# Patient Record
Sex: Female | Born: 1945 | Race: White | Hispanic: No | Smoking: Never smoker
Health system: Southern US, Community
[De-identification: ages and names within clinical notes are randomized; demographics above are authoritative.]

## PROBLEM LIST (undated history)

## (undated) DIAGNOSIS — D1803 Hemangioma of intra-abdominal structures: Secondary | ICD-10-CM

## (undated) DIAGNOSIS — A048 Other specified bacterial intestinal infections: Secondary | ICD-10-CM

## (undated) DIAGNOSIS — I700 Atherosclerosis of aorta: Secondary | ICD-10-CM

## (undated) DIAGNOSIS — K760 Fatty (change of) liver, not elsewhere classified: Secondary | ICD-10-CM

## (undated) DIAGNOSIS — U071 COVID-19: Secondary | ICD-10-CM

## (undated) DIAGNOSIS — D1800 Hemangioma unspecified site: Secondary | ICD-10-CM

## (undated) DIAGNOSIS — K449 Diaphragmatic hernia without obstruction or gangrene: Secondary | ICD-10-CM

## (undated) DIAGNOSIS — D126 Benign neoplasm of colon, unspecified: Secondary | ICD-10-CM

## (undated) DIAGNOSIS — F419 Anxiety disorder, unspecified: Secondary | ICD-10-CM

## (undated) DIAGNOSIS — I728 Aneurysm of other specified arteries: Secondary | ICD-10-CM

## (undated) DIAGNOSIS — K219 Gastro-esophageal reflux disease without esophagitis: Secondary | ICD-10-CM

## (undated) DIAGNOSIS — C4491 Basal cell carcinoma of skin, unspecified: Secondary | ICD-10-CM

## (undated) DIAGNOSIS — E785 Hyperlipidemia, unspecified: Secondary | ICD-10-CM

## (undated) DIAGNOSIS — I10 Essential (primary) hypertension: Secondary | ICD-10-CM

## (undated) DIAGNOSIS — K5790 Diverticulosis of intestine, part unspecified, without perforation or abscess without bleeding: Secondary | ICD-10-CM

## (undated) HISTORY — DX: Gastro-esophageal reflux disease without esophagitis: K21.9

## (undated) HISTORY — DX: COVID-19: U07.1

## (undated) HISTORY — DX: Aneurysm of other specified arteries: I72.8

## (undated) HISTORY — DX: Benign neoplasm of colon, unspecified: D12.6

## (undated) HISTORY — DX: Fatty (change of) liver, not elsewhere classified: K76.0

## (undated) HISTORY — DX: Diaphragmatic hernia without obstruction or gangrene: K44.9

## (undated) HISTORY — PX: OTHER SURGICAL HISTORY: SHX169

## (undated) HISTORY — DX: Diverticulosis of intestine, part unspecified, without perforation or abscess without bleeding: K57.90

## (undated) HISTORY — DX: Anxiety disorder, unspecified: F41.9

## (undated) HISTORY — DX: Atherosclerosis of aorta: I70.0

## (undated) HISTORY — PX: CHOLECYSTECTOMY: SHX55

## (undated) HISTORY — DX: Other specified bacterial intestinal infections: A04.8

## (undated) HISTORY — DX: Hyperlipidemia, unspecified: E78.5

## (undated) HISTORY — PX: UPPER GASTROINTESTINAL ENDOSCOPY: SHX188

## (undated) HISTORY — DX: Essential (primary) hypertension: I10

## (undated) HISTORY — DX: Hemangioma of intra-abdominal structures: D18.03

## (undated) HISTORY — DX: Hemangioma unspecified site: D18.00

---
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
## 1999-07-25 ENCOUNTER — Other Ambulatory Visit: Admission: RE | Admit: 1999-07-25 | Discharge: 1999-07-25 | Payer: Self-pay | Admitting: Obstetrics and Gynecology

## 2000-08-15 ENCOUNTER — Other Ambulatory Visit: Admission: RE | Admit: 2000-08-15 | Discharge: 2000-08-15 | Payer: Self-pay | Admitting: Obstetrics and Gynecology

## 2001-08-18 ENCOUNTER — Other Ambulatory Visit: Admission: RE | Admit: 2001-08-18 | Discharge: 2001-08-18 | Payer: Self-pay | Admitting: Obstetrics and Gynecology

## 2002-03-30 ENCOUNTER — Encounter: Payer: Self-pay | Admitting: Orthopaedic Surgery

## 2002-03-30 ENCOUNTER — Ambulatory Visit (HOSPITAL_COMMUNITY): Admission: RE | Admit: 2002-03-30 | Discharge: 2002-03-30 | Payer: Self-pay | Admitting: Orthopaedic Surgery

## 2002-08-31 ENCOUNTER — Other Ambulatory Visit: Admission: RE | Admit: 2002-08-31 | Discharge: 2002-08-31 | Payer: Self-pay | Admitting: Obstetrics and Gynecology

## 2003-03-02 ENCOUNTER — Other Ambulatory Visit: Admission: RE | Admit: 2003-03-02 | Discharge: 2003-03-02 | Payer: Self-pay | Admitting: Obstetrics and Gynecology

## 2003-10-18 ENCOUNTER — Other Ambulatory Visit: Admission: RE | Admit: 2003-10-18 | Discharge: 2003-10-18 | Payer: Self-pay | Admitting: Obstetrics and Gynecology

## 2004-10-09 ENCOUNTER — Ambulatory Visit: Payer: Self-pay | Admitting: Family Medicine

## 2004-10-11 ENCOUNTER — Ambulatory Visit: Payer: Self-pay | Admitting: Family Medicine

## 2004-11-05 ENCOUNTER — Other Ambulatory Visit: Admission: RE | Admit: 2004-11-05 | Discharge: 2004-11-05 | Payer: Self-pay | Admitting: Obstetrics and Gynecology

## 2005-01-23 ENCOUNTER — Ambulatory Visit: Payer: Self-pay | Admitting: Family Medicine

## 2005-01-29 ENCOUNTER — Ambulatory Visit: Payer: Self-pay | Admitting: Family Medicine

## 2005-02-13 ENCOUNTER — Encounter (INDEPENDENT_AMBULATORY_CARE_PROVIDER_SITE_OTHER): Payer: Self-pay | Admitting: *Deleted

## 2005-02-13 ENCOUNTER — Encounter (INDEPENDENT_AMBULATORY_CARE_PROVIDER_SITE_OTHER): Payer: Self-pay | Admitting: Specialist

## 2005-02-13 ENCOUNTER — Ambulatory Visit (HOSPITAL_COMMUNITY): Admission: RE | Admit: 2005-02-13 | Discharge: 2005-02-13 | Payer: Self-pay | Admitting: Gastroenterology

## 2007-02-16 ENCOUNTER — Ambulatory Visit: Payer: Self-pay | Admitting: Family Medicine

## 2007-03-19 ENCOUNTER — Ambulatory Visit: Payer: Self-pay | Admitting: Internal Medicine

## 2007-03-19 DIAGNOSIS — E782 Mixed hyperlipidemia: Secondary | ICD-10-CM

## 2007-03-25 LAB — CONVERTED CEMR LAB
ALT: 19 units/L (ref 0–40)
AST: 18 units/L (ref 0–37)
Cholesterol: 133 mg/dL (ref 0–200)
HDL: 40.3 mg/dL (ref >39.0)
LDL Cholesterol: 74 mg/dL (ref 0–99)
Total CHOL/HDL Ratio: 3.3
Triglycerides: 96 mg/dL (ref 0–149)
VLDL: 19 mg/dL (ref 0–40)

## 2007-06-11 ENCOUNTER — Ambulatory Visit: Payer: Self-pay | Admitting: Family Medicine

## 2007-06-11 DIAGNOSIS — I749 Embolism and thrombosis of unspecified artery: Secondary | ICD-10-CM | POA: Insufficient documentation

## 2007-06-12 ENCOUNTER — Encounter (INDEPENDENT_AMBULATORY_CARE_PROVIDER_SITE_OTHER): Payer: Self-pay | Admitting: Internal Medicine

## 2007-06-12 ENCOUNTER — Telehealth (INDEPENDENT_AMBULATORY_CARE_PROVIDER_SITE_OTHER): Payer: Self-pay | Admitting: Internal Medicine

## 2007-06-29 HISTORY — PX: KNEE SURGERY: SHX244

## 2007-06-30 ENCOUNTER — Telehealth (INDEPENDENT_AMBULATORY_CARE_PROVIDER_SITE_OTHER): Payer: Self-pay | Admitting: Internal Medicine

## 2007-09-23 ENCOUNTER — Encounter (INDEPENDENT_AMBULATORY_CARE_PROVIDER_SITE_OTHER): Payer: Self-pay | Admitting: *Deleted

## 2007-10-02 ENCOUNTER — Ambulatory Visit: Payer: Self-pay | Admitting: Family Medicine

## 2007-10-07 LAB — CONVERTED CEMR LAB
ALT: 32 units/L (ref 0–35)
AST: 22 units/L (ref 0–37)
BUN: 13 mg/dL (ref 6–23)
CO2: 29 meq/L (ref 19–32)
Calcium: 9.4 mg/dL (ref 8.4–10.5)
Chloride: 108 meq/L (ref 96–112)
Cholesterol: 198 mg/dL (ref 0–200)
Creatinine, Ser: 0.7 mg/dL (ref 0.4–1.2)
GFR calc Af Amer: 109 mL/min
GFR calc non Af Amer: 90 mL/min
Glucose, Bld: 94 mg/dL (ref 70–99)
HDL: 48.2 mg/dL (ref >39.0)
LDL Cholesterol: 136 mg/dL — ABNORMAL HIGH (ref 0–99)
Potassium: 4.5 meq/L (ref 3.5–5.1)
Sodium: 142 meq/L (ref 135–145)
Total CHOL/HDL Ratio: 4.1
Triglycerides: 67 mg/dL (ref 0–149)
VLDL: 13 mg/dL (ref 0–40)

## 2007-12-29 ENCOUNTER — Encounter (INDEPENDENT_AMBULATORY_CARE_PROVIDER_SITE_OTHER): Payer: Self-pay | Admitting: Internal Medicine

## 2008-04-14 ENCOUNTER — Ambulatory Visit: Payer: Self-pay | Admitting: Family Medicine

## 2008-04-18 LAB — CONVERTED CEMR LAB
ALT: 21 units/L (ref 0–35)
Cholesterol: 152 mg/dL (ref 0–200)
Triglycerides: 78 mg/dL (ref 0–149)

## 2008-06-01 ENCOUNTER — Ambulatory Visit: Payer: Self-pay | Admitting: Family Medicine

## 2008-06-01 DIAGNOSIS — K219 Gastro-esophageal reflux disease without esophagitis: Secondary | ICD-10-CM

## 2008-06-01 DIAGNOSIS — R1011 Right upper quadrant pain: Secondary | ICD-10-CM

## 2008-07-13 ENCOUNTER — Encounter (INDEPENDENT_AMBULATORY_CARE_PROVIDER_SITE_OTHER): Payer: Self-pay | Admitting: Internal Medicine

## 2008-10-05 ENCOUNTER — Ambulatory Visit: Payer: Self-pay | Admitting: Family Medicine

## 2008-10-06 LAB — CONVERTED CEMR LAB
ALT: 16 units/L (ref 0–35)
Cholesterol: 141 mg/dL (ref 0–200)
HDL: 45.1 mg/dL (ref >39.0)
LDL Cholesterol: 81 mg/dL (ref 0–99)
Triglycerides: 73 mg/dL (ref 0–149)
VLDL: 15 mg/dL (ref 0–40)

## 2008-10-28 DIAGNOSIS — D1800 Hemangioma unspecified site: Secondary | ICD-10-CM

## 2008-10-28 DIAGNOSIS — K760 Fatty (change of) liver, not elsewhere classified: Secondary | ICD-10-CM

## 2008-10-28 HISTORY — DX: Fatty (change of) liver, not elsewhere classified: K76.0

## 2008-10-28 HISTORY — DX: Hemangioma unspecified site: D18.00

## 2008-11-30 ENCOUNTER — Ambulatory Visit: Payer: Self-pay | Admitting: Family Medicine

## 2008-11-30 DIAGNOSIS — K449 Diaphragmatic hernia without obstruction or gangrene: Secondary | ICD-10-CM | POA: Insufficient documentation

## 2008-11-30 DIAGNOSIS — M949 Disorder of cartilage, unspecified: Secondary | ICD-10-CM

## 2008-11-30 DIAGNOSIS — M899 Disorder of bone, unspecified: Secondary | ICD-10-CM | POA: Insufficient documentation

## 2008-11-30 HISTORY — DX: Disorder of bone, unspecified: M89.9

## 2009-01-10 ENCOUNTER — Ambulatory Visit: Payer: Self-pay | Admitting: Family Medicine

## 2009-01-12 LAB — CONVERTED CEMR LAB
ALT: 20 units/L (ref 0–35)
HDL: 46.5 mg/dL (ref >39.00)
LDL Cholesterol: 100 mg/dL — ABNORMAL HIGH (ref 0–99)
VLDL: 18.6 mg/dL (ref 0.0–40.0)

## 2009-02-16 ENCOUNTER — Ambulatory Visit (HOSPITAL_BASED_OUTPATIENT_CLINIC_OR_DEPARTMENT_OTHER): Admission: RE | Admit: 2009-02-16 | Discharge: 2009-02-16 | Payer: Self-pay | Admitting: Orthopedic Surgery

## 2009-04-10 ENCOUNTER — Telehealth (INDEPENDENT_AMBULATORY_CARE_PROVIDER_SITE_OTHER): Payer: Self-pay | Admitting: Internal Medicine

## 2009-06-05 ENCOUNTER — Encounter (INDEPENDENT_AMBULATORY_CARE_PROVIDER_SITE_OTHER): Payer: Self-pay | Admitting: Internal Medicine

## 2009-06-05 ENCOUNTER — Ambulatory Visit: Payer: Self-pay | Admitting: Family Medicine

## 2009-06-06 ENCOUNTER — Ambulatory Visit: Payer: Self-pay | Admitting: Family Medicine

## 2009-06-06 LAB — CONVERTED CEMR LAB
ALT: 15 units/L (ref 0–35)
Bacteria, UA: 0
Basophils Absolute: 0 10*3/uL (ref 0.0–0.1)
Basophils Relative: 0 % (ref 0.0–3.0)
Bilirubin Urine: NEGATIVE
Bilirubin, Direct: 0.1 mg/dL (ref 0.0–0.3)
CO2: 29 meq/L (ref 19–32)
Calcium: 8.7 mg/dL (ref 8.4–10.5)
Eosinophils Absolute: 0.1 10*3/uL (ref 0.0–0.7)
GFR calc non Af Amer: 76.58 mL/min (ref >60)
Glucose, Urine, Semiquant: NEGATIVE
HCT: 36.4 % (ref 36.0–46.0)
HDL: 44.6 mg/dL (ref >39.00)
Hemoglobin: 12.5 g/dL (ref 12.0–15.0)
Ketones, urine, test strip: NEGATIVE
Lymphocytes Relative: 28 % (ref 12.0–46.0)
MCHC: 34.4 g/dL (ref 30.0–36.0)
Monocytes Absolute: 0.4 10*3/uL (ref 0.1–1.0)
Monocytes Relative: 9.9 % (ref 3.0–12.0)
Neutro Abs: 2.6 10*3/uL (ref 1.4–7.7)
Potassium: 4.3 meq/L (ref 3.5–5.1)
RBC / HPF: 0
RBC: 3.85 M/uL — ABNORMAL LOW (ref 3.87–5.11)
Sodium: 144 meq/L (ref 135–145)
Specific Gravity, Urine: 1.01
TSH: 2.02 microintl units/mL (ref 0.35–5.50)
Total Bilirubin: 1 mg/dL (ref 0.3–1.2)
Total CHOL/HDL Ratio: 4
pH: 7.5

## 2009-06-07 ENCOUNTER — Telehealth (INDEPENDENT_AMBULATORY_CARE_PROVIDER_SITE_OTHER): Payer: Self-pay | Admitting: *Deleted

## 2009-06-09 ENCOUNTER — Ambulatory Visit: Payer: Self-pay | Admitting: Gastroenterology

## 2009-06-12 ENCOUNTER — Ambulatory Visit (HOSPITAL_COMMUNITY): Admission: RE | Admit: 2009-06-12 | Discharge: 2009-06-12 | Payer: Self-pay | Admitting: Gastroenterology

## 2009-06-16 ENCOUNTER — Ambulatory Visit (HOSPITAL_COMMUNITY): Admission: RE | Admit: 2009-06-16 | Discharge: 2009-06-16 | Payer: Self-pay | Admitting: Gastroenterology

## 2009-06-16 ENCOUNTER — Telehealth: Payer: Self-pay | Admitting: Gastroenterology

## 2009-06-19 ENCOUNTER — Telehealth: Payer: Self-pay | Admitting: Gastroenterology

## 2009-07-14 ENCOUNTER — Telehealth: Payer: Self-pay | Admitting: Gastroenterology

## 2009-07-24 ENCOUNTER — Telehealth: Payer: Self-pay | Admitting: Family Medicine

## 2009-07-24 ENCOUNTER — Encounter (INDEPENDENT_AMBULATORY_CARE_PROVIDER_SITE_OTHER): Payer: Self-pay | Admitting: Internal Medicine

## 2009-08-04 ENCOUNTER — Ambulatory Visit: Payer: Self-pay | Admitting: Family Medicine

## 2009-08-04 DIAGNOSIS — J019 Acute sinusitis, unspecified: Secondary | ICD-10-CM

## 2009-08-11 ENCOUNTER — Ambulatory Visit (HOSPITAL_COMMUNITY): Admission: RE | Admit: 2009-08-11 | Discharge: 2009-08-11 | Payer: Self-pay | Admitting: General Surgery

## 2009-08-11 ENCOUNTER — Encounter (INDEPENDENT_AMBULATORY_CARE_PROVIDER_SITE_OTHER): Payer: Self-pay | Admitting: General Surgery

## 2009-08-14 ENCOUNTER — Encounter (INDEPENDENT_AMBULATORY_CARE_PROVIDER_SITE_OTHER): Payer: Self-pay | Admitting: Internal Medicine

## 2009-08-28 ENCOUNTER — Encounter (INDEPENDENT_AMBULATORY_CARE_PROVIDER_SITE_OTHER): Payer: Self-pay | Admitting: Internal Medicine

## 2009-08-30 ENCOUNTER — Telehealth: Payer: Self-pay | Admitting: Family Medicine

## 2009-08-30 ENCOUNTER — Encounter (INDEPENDENT_AMBULATORY_CARE_PROVIDER_SITE_OTHER): Payer: Self-pay | Admitting: Internal Medicine

## 2009-10-30 ENCOUNTER — Ambulatory Visit: Payer: Self-pay | Admitting: Family Medicine

## 2009-11-05 ENCOUNTER — Encounter (INDEPENDENT_AMBULATORY_CARE_PROVIDER_SITE_OTHER): Payer: Self-pay | Admitting: Internal Medicine

## 2009-11-29 ENCOUNTER — Telehealth: Payer: Self-pay | Admitting: Gastroenterology

## 2009-11-30 DIAGNOSIS — R933 Abnormal findings on diagnostic imaging of other parts of digestive tract: Secondary | ICD-10-CM

## 2009-12-04 ENCOUNTER — Ambulatory Visit: Payer: Self-pay | Admitting: Family Medicine

## 2009-12-04 LAB — CONVERTED CEMR LAB
ALT: 16 units/L (ref 0–35)
LDL Cholesterol: 103 mg/dL — ABNORMAL HIGH (ref 0–99)
Total CHOL/HDL Ratio: 3
Triglycerides: 110 mg/dL (ref 0.0–149.0)

## 2009-12-05 ENCOUNTER — Ambulatory Visit (HOSPITAL_COMMUNITY): Admission: RE | Admit: 2009-12-05 | Discharge: 2009-12-05 | Payer: Self-pay | Admitting: Gastroenterology

## 2009-12-05 ENCOUNTER — Telehealth: Payer: Self-pay | Admitting: Gastroenterology

## 2009-12-05 LAB — CONVERTED CEMR LAB: Vit D, 25-Hydroxy: 24 ng/mL — ABNORMAL LOW (ref 30–89)

## 2010-03-13 ENCOUNTER — Encounter: Payer: Self-pay | Admitting: Family Medicine

## 2010-06-05 ENCOUNTER — Encounter (INDEPENDENT_AMBULATORY_CARE_PROVIDER_SITE_OTHER): Payer: Self-pay | Admitting: *Deleted

## 2010-07-31 ENCOUNTER — Encounter (INDEPENDENT_AMBULATORY_CARE_PROVIDER_SITE_OTHER): Payer: Self-pay | Admitting: *Deleted

## 2010-09-05 ENCOUNTER — Telehealth: Payer: Self-pay | Admitting: Family Medicine

## 2010-10-28 HISTORY — PX: COLONOSCOPY: SHX174

## 2010-11-27 NOTE — Assessment & Plan Note (Signed)
Summary: SINUS/DLO   Vital Signs:  Patient profile:   65 year old female Height:      61.5 inches Weight:      176.13 pounds BMI:     32.86 Temp:     98.5 degrees F oral Pulse rate:   80 / minute Pulse rhythm:   regular BP sitting:   112 / 82  (left arm) Cuff size:   regular  Vitals Entered By: Delilah Shan CMA Duncan Dull) (October 30, 2009 3:48 PM) CC: Sinus problems   History of Present Illness: 65 yo with 3 day h/o sinus pressure, frontal headache and malaise. Trying mucinex OTC. Concerned because she is having cataract surgery in a few days. No cough, no wheezing, no SOB. No sore throat. No n/v/d.  Current Medications (verified): 1)  Fish Oil 1000 Mg  Caps (Omega-3 Fatty Acids) .... Take 1 Capsule By Mouth Four Times A Day 2)  Balanced Care   Tabs (Multiple Vitamins-Minerals) .... Take 1 Tablet By Mouth Once A Day 3)  Calcium 600/vitamin D 600-200 Mg-Unit  Tabs (Calcium Carbonate-Vitamin D) .... Take 1 Tablet By Mouth Two Times A Day 4)  Cvs Loratadine 10 Mg  Tabs (Loratadine) .... Take 1 Tablet By Mouth Once A Day As Needed 5)  Cvs High Potency Vitamin D 1000 Unit  Tabs (Cholecalciferol) .... Take 1 Tablet By Mouth Once A Day 6)  Simvastatin 40 Mg Tabs (Simvastatin) .Marland Kitchen.. 1 Daily For Cholesterol 7)  Omeprazole 40 Mg Cpdr (Omeprazole) .Marland Kitchen.. 1 Each Morning With Water, No Food or Fluids For 30-60 Min, Then Miust Have Food or Fluids 8)  Vitamin C 500 Mg  Tabs (Ascorbic Acid) .... Otc As Directed. 9)  Hyomax-Sl 0.125 Mg Subl (Hyoscyamine Sulfate) .... Take 2 Tabs Sublingual Q.4 H. P.r.n. 10)  Azithromycin 250 Mg  Tabs (Azithromycin) .... 2 By  Mouth Today and Then 1 Daily For 4 Days  Allergies: 1)  ! Sulfa  Review of Systems      See HPI  Physical Exam  General:  alert, well-developed, well-nourished, well-hydrated, and overweight-appearing.  NAD Ears:  R ear normal and L ear normal.   Nose:  mucosal erythema.  +/- sinuses Mouth:  Oral mucosa and oropharynx without lesions  or exudates.  Teeth in good repair. Lungs:  normal respiratory effort, no intercostal retractions, no accessory muscle use, and normal breath sounds.   Heart:  normal rate, regular rhythm, and no murmur.   Psych:  normally interactive and moderately anxious.     Impression & Recommendations:  Problem # 1:  SINUSITIS, ACUTE (ICD-461.9) Assessment New Will treat with Zpack given that she is having surgery in a few days.  Continue supportive care with mucinex and Ibuprofen.  Advised to call her opthomologist to update him with her current status. The following medications were removed from the medication list:    Azithromycin 500 Mg Tabs (Azithromycin) .Marland Kitchen... 1 tab by mouth daily. Her updated medication list for this problem includes:    Azithromycin 250 Mg Tabs (Azithromycin) .Marland Kitchen... 2 by  mouth today and then 1 daily for 4 days  Complete Medication List: 1)  Fish Oil 1000 Mg Caps (Omega-3 fatty acids) .... Take 1 capsule by mouth four times a day 2)  Balanced Care Tabs (Multiple vitamins-minerals) .... Take 1 tablet by mouth once a day 3)  Calcium 600/vitamin D 600-200 Mg-unit Tabs (Calcium carbonate-vitamin d) .... Take 1 tablet by mouth two times a day 4)  Cvs Loratadine 10 Mg  Tabs (Loratadine) .... Take 1 tablet by mouth once a day as needed 5)  Cvs High Potency Vitamin D 1000 Unit Tabs (Cholecalciferol) .... Take 1 tablet by mouth once a day 6)  Simvastatin 40 Mg Tabs (Simvastatin) .Marland Kitchen.. 1 daily for cholesterol 7)  Omeprazole 40 Mg Cpdr (Omeprazole) .Marland Kitchen.. 1 each morning with water, no food or fluids for 30-60 min, then miust have food or fluids 8)  Vitamin C 500 Mg Tabs (Ascorbic acid) .... Otc as directed. 9)  Hyomax-sl 0.125 Mg Subl (Hyoscyamine sulfate) .... Take 2 tabs sublingual q.4 h. p.r.n. 10)  Azithromycin 250 Mg Tabs (Azithromycin) .... 2 by  mouth today and then 1 daily for 4 days Prescriptions: AZITHROMYCIN 250 MG  TABS (AZITHROMYCIN) 2 by  mouth today and then 1 daily for 4  days  #6 x 0   Entered and Authorized by:   Ruthe Mannan MD   Signed by:   Ruthe Mannan MD on 10/30/2009   Method used:   Electronically to        [facility] [address] [phone]* (retail)       [address] [place], [place]  [id number]       Ph: [phone]       Fax: [phone]   RxID:   [id number]   Current Allergies (reviewed today): ! SULFA

## 2010-11-27 NOTE — Progress Notes (Signed)
Summary: prior auth needed for omeprazole  Phone Note From Pharmacy   Caller: [facility] [address] #[id number]*/ [facility] Summary of Call: Prior Berkley Harvey is needed for omeprazole, form is on your desk. Initial call taken by: Lowella Petties CMA, AAMA,  September 05, 2010 2:53 PM  Follow-up for Phone Call        on my desk. Ruthe Mannan MD  September 06, 2010 8:41 AM  Prior Berkley Harvey given for omeprazole, approval letter is on  your desk for signature and scanning.  Advised pharmacy. Follow-up by: Lowella Petties CMA, AAMA,  September 10, 2010 12:34 PM

## 2010-11-27 NOTE — Miscellaneous (Signed)
Summary: flu vaccine at [facility]  Clinical Lists Changes  Observations: Added new observation of FLU VAX: Historical (07/27/2010 8:17)      Immunization History:  Influenza Immunization History:    Influenza:  historical (07/27/2010)  Pt received flu vaccine at [facility] [address], .             Lowella Petties CMA  July 31, 2010 8:17 AM

## 2010-11-27 NOTE — Progress Notes (Signed)
Summary: F/U Ultrasound Scheduled  ---- Converted from flag ---- ---- 06/12/2009 3:23 PM, Laureen Ochs LPN wrote: Needs f/u liver ultrasound, see report from 06-12-09. ------------------------------  Phone Note Outgoing Call   Call placed by: Laureen Ochs LPN,  November 29, 2009 4:13 PM Call placed to: Patient Summary of Call:  Ultrasound scheduled at [facility] on 12-05-09 at 8am, NPO after 12mn. Message left for patient to callback.  Initial call taken by: Laureen Ochs LPN,  November 29, 2009 4:14 PM  Follow-up for Phone Call        Ultrasound appt. information reviewed w/pt. Pt. instructed to call back as needed.  Follow-up by: Laureen Ochs LPN,  November 30, 2009 9:39 AM  New Problems: NONSPECIFIC ABN FINDING RAD & OTH EXAM GI TRACT (ICD-793.4)   New Problems: NONSPECIFIC ABN FINDING RAD & OTH EXAM GI TRACT (ICD-793.4)

## 2010-11-27 NOTE — Letter (Signed)
Summary: Nadara Eaton letter  [facility]  [address] [place], [place] [address]   Phone: [phone]  Fax: [phone]       06/05/2010 MRN: [id number]  [facility] [address] [place], [place]  [address]  Dear Ms. [facility],  [facility] [place], and [facility] announce the retirement of Arta Silence, M.D., from full-time practice at the [facility] office effective April 26, 2010 and his plans of returning part-time.  It is important to Dr. Hetty Ely and to our practice that you understand that [facility] has seven physicians in our office for your health care needs.  We will continue to offer the same exceptional care that you have today.    Dr. Hetty Ely has spoken to many of you about his plans for retirement and returning part-time in the fall.   We will continue to work with you through the transition to schedule appointments for you in the office and meet the high standards that [facility] is committed to.   Again, it is with great pleasure that we share the news that Dr. Hetty Ely will return to [facility] at [facility] in October of 2011 with a reduced schedule.    If you have any questions, or would like to request an appointment with one of our physicians, please call us at [phone] and press the option for Scheduling an appointment.  We take pleasure in providing you with excellent patient care and look forward to seeing you at your next office visit.  Our [facility] Physicians are:  Tillman Abide, M.D. Laurita Quint, M.D. Roxy Manns, M.D. Kerby Nora, M.D. Hannah Beat, M.D. Ruthe Mannan, M.D. We proudly welcomed Raechel Ache, M.D. and Eustaquio Boyden, M.D. to the practice in July/August 2011.  Sincerely,   [facility]

## 2010-11-27 NOTE — Progress Notes (Signed)
Summary: Ultrasound Report  ---- Converted from flag ---- ---- 12/05/2009 2:18 PM, Louis Meckel MD wrote: please tell pt ultrasound of hemangioma looks stable - no significant changes ------------------------------  Phone Note Outgoing Call   Call placed by: Laureen Ochs LPN,  December 05, 2009 3:34 PM Call placed to: Patient Summary of Call: Above MD orders reviewed with patient. Pt. instructed to call back as needed.   Initial call taken by: Laureen Ochs LPN,  December 05, 2009 3:35 PM

## 2010-11-27 NOTE — Miscellaneous (Signed)
Summary: DEXA--12/29/2007 from GYN  Clinical Lists Changes  Observations: Added new observation of PAST SURG HX: EGD +--3/06--[facility] 9/08 R knee surg--meniscus tear--Murphy Left knee surgery  laproscopic cholecystectomy--08/11/2009 DEXA--GYN, osteopenia--12/29/2007  (11/05/2009 13:40) Added new observation of BONE DENSITY: abnormal (12/29/2007 13:41)      Preventive Care Screening  Bone Density:    Date:  12/29/2007    Results:  abnormal   Past History:  Past Surgical History: EGD +--3/06--[facility]y [facility] 9/08 R knee surg--meniscus tear--Murphy Left knee surgery  laproscopic cholecystectomy--08/11/2009 DEXA--GYN, osteopenia--12/29/2007

## 2010-12-20 ENCOUNTER — Encounter: Payer: Self-pay | Admitting: Family Medicine

## 2010-12-20 ENCOUNTER — Other Ambulatory Visit: Payer: Self-pay | Admitting: Family Medicine

## 2010-12-20 ENCOUNTER — Ambulatory Visit (INDEPENDENT_AMBULATORY_CARE_PROVIDER_SITE_OTHER): Payer: Medicare Other | Admitting: Family Medicine

## 2010-12-20 DIAGNOSIS — E782 Mixed hyperlipidemia: Secondary | ICD-10-CM

## 2010-12-20 DIAGNOSIS — M899 Disorder of bone, unspecified: Secondary | ICD-10-CM

## 2010-12-20 DIAGNOSIS — K219 Gastro-esophageal reflux disease without esophagitis: Secondary | ICD-10-CM

## 2010-12-20 LAB — HEPATIC FUNCTION PANEL
ALT: 16 U/L (ref 0–35)
Bilirubin, Direct: 0.2 mg/dL (ref 0.0–0.3)
Total Bilirubin: 1 mg/dL (ref 0.3–1.2)

## 2010-12-20 LAB — LIPID PANEL
Cholesterol: 154 mg/dL (ref 0–200)
HDL: 40.9 mg/dL (ref >39.00)
LDL Cholesterol: 94 mg/dL (ref 0–99)
Total CHOL/HDL Ratio: 4
Triglycerides: 95 mg/dL (ref 0.0–149.0)
VLDL: 19 mg/dL (ref 0.0–40.0)

## 2010-12-20 LAB — BASIC METABOLIC PANEL
BUN: 15 mg/dL (ref 6–23)
Calcium: 9.2 mg/dL (ref 8.4–10.5)
Creatinine, Ser: 0.7 mg/dL (ref 0.4–1.2)
GFR: 88.91 mL/min (ref >60.00)

## 2010-12-25 ENCOUNTER — Encounter: Payer: Self-pay | Admitting: Family Medicine

## 2010-12-25 LAB — HM MAMMOGRAPHY

## 2010-12-25 LAB — HM PAP SMEAR

## 2010-12-25 LAB — HM COLONOSCOPY

## 2010-12-25 NOTE — Assessment & Plan Note (Signed)
Summary: TRANSFER FROM [facility]/REFILL MED/CLE  MEDICARE/BCBS   Vital Signs:  Patient profile:   65 year old female Height:      61.5 inches Weight:      181 pounds BMI:     33.77 Temp:     98.2 degrees F oral Pulse rate:   69 / minute Pulse rhythm:   regular BP sitting:   130 / 80  (left arm) Cuff size:   regular  Vitals Entered By: Linde Gillis CMA Duncan Dull) (December 20, 2010 8:35 AM) CC: establish care, Billie patient   History of Present Illness: 65 yo here to transfer care to me (Billie Pt) without complaints.  HLD- on Simvastatin 40 mg daily.  No complaints, no myalgias.  GERD- well controlled with as needed omeprazole.  Has OBGYN, UTD on prevention (awaiting records).  Osteopenia- per pt, recent dexa scan was stable- osteopenia.   Current Medications (verified): 1)  Fish Oil 1000 Mg  Caps (Omega-3 Fatty Acids) .... Take 1 Capsule By Mouth Four Times A Day 2)  Cvs Loratadine 10 Mg  Tabs (Loratadine) .... Take 1 Tablet By Mouth Once A Day As Needed 3)  Cvs High Potency Vitamin D 1000 Unit  Tabs (Cholecalciferol) .... Take 1 Tablet By Mouth Once A Day 4)  Simvastatin 40 Mg Tabs (Simvastatin) .Marland Kitchen.. 1 Daily For Cholesterol 5)  Omeprazole 40 Mg Cpdr (Omeprazole) .Marland Kitchen.. 1 Each Morning With Water, No Food or Fluids For 30-60 Min, Then Miust Have Food or Fluids 6)  Vitamin C 500 Mg  Tabs (Ascorbic Acid) .... Otc As Directed.  Allergies: 1)  ! 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  Alcohol Use - no  Risk Factors: Caffeine Use: 2 (11/30/2008) Exercise: no (11/30/2008)  Risk Factors: Smoking Status: never (06/09/2009) Passive Smoke Exposure: no (11/30/2008)  Review of Systems      See HPI General:  Denies malaise. Eyes:  Denies blurring. ENT:  Denies difficulty swallowing. CV:  Denies chest pain or discomfort. Resp:  Denies shortness of breath. GI:  Denies abdominal pain and change in bowel habits. GU:  Denies abnormal vaginal bleeding, discharge, and dysuria. MS:  Denies joint pain, joint redness, and joint swelling. Derm:  Denies rash. Neuro:  Denies headaches. Psych:  Denies anxiety and depression. Endo:  Denies cold intolerance and heat intolerance. Heme:  Denies abnormal bruising and bleeding.  Physical Exam  General:  alert, well-developed, well-nourished, well-hydrated, and overweight-appearing.  NAD Head:  normocephalic and atraumatic.   Eyes:  vision grossly intact, pupils equal, pupils round, and pupils reactive to light.   Ears:  R ear normal and L ear normal.   Nose:  no external deformity.   Mouth:  good dentition.   Neck:  No deformities, masses, or tenderness noted. Lungs:  Normal respiratory effort, chest expands symmetrically. Lungs are clear to auscultation, no crackles or wheezes. Heart:  Normal rate and regular rhythm. S1 and S2 normal without gallop, murmur, click, rub or other extra sounds. Abdomen:  soft, non-tender, and normal bowel sounds.  Msk:  No deformity or scoliosis noted of thoracic or lumbar spine.   Extremities:  no edema Neurologic:  alert & oriented X3 and cranial nerves II-XII intact.   Skin:  Intact without suspicious lesions or rashes Psych:  normally interactive and moderately anxious.     Impression & Recommendations:  Problem # 1:  OSTEOPENIA (ICD-733.90) Assessment  Unchanged per pt, stable.  Continue calcium/vit D.  Will request records. Recheck Vit D today. The following medications were removed from the medication list:    Calcium 600/vitamin D 600-200 Mg-unit Tabs (Calcium carbonate-vitamin d) .Marland Kitchen... Take 1 tablet by mouth two times a day Her updated medication list for this problem includes:    Cvs High Potency Vitamin D 1000 Unit Tabs (Cholecalciferol) .Marland Kitchen... Take 1 tablet by mouth once a day  Orders: T-Vitamin D (25-Hydroxy) (04540-98119)  Problem # 2:  HYPERLIPIDEMIA, MIXED (ICD-272.2) Assessment: Unchanged recheck FLP, Lipid panel today. Her updated medication list for this problem includes:    Simvastatin 40 Mg Tabs (Simvastatin) .Marland Kitchen... 1 daily for cholesterol  Orders: Venipuncture (14782) TLB-Lipid Panel (80061-LIPID) TLB-BMP (Basic Metabolic Panel-BMET) (80048-METABOL) TLB-Hepatic/Liver Function Pnl (80076-HEPATIC)  Problem # 3:  GERD (ICD-530.81) Assessment: Unchanged Stable.   The following medications were removed from the medication list:    Hyomax-sl 0.125 Mg Subl (Hyoscyamine sulfate) .Marland Kitchen... Take 2 tabs sublingual q.4 h. p.r.n. Her updated medication list for this problem includes:    Omeprazole 40 Mg Cpdr (Omeprazole) .Marland Kitchen... 1 each morning with water, no food or fluids for 30-60 min, then miust have food or fluids  Complete Medication List: 1)  Fish Oil 1000 Mg Caps (Omega-3 fatty acids) .... Take 1 capsule by mouth four times a day 2)  Cvs Loratadine 10 Mg Tabs (Loratadine) .... Take 1 tablet by mouth once a day as needed 3)  Cvs High Potency Vitamin D 1000 Unit Tabs (Cholecalciferol) .... Take 1 tablet by mouth once a day 4)  Simvastatin 40 Mg Tabs (Simvastatin) .Marland Kitchen.. 1 daily for cholesterol 5)  Omeprazole 40 Mg Cpdr (Omeprazole) .Marland Kitchen.. 1 each morning with water, no food or fluids for 30-60 min, then miust have food or fluids 6)  Vitamin C 500 Mg Tabs (Ascorbic acid) .... Otc as directed.  Patient Instructions: 1)  Please  make an appoitnment for a medicare physical.   Orders Added: 1)  Venipuncture [36415] 2)  TLB-Lipid Panel [80061-LIPID] 3)  TLB-BMP (Basic Metabolic Panel-BMET) [80048-METABOL] 4)  TLB-Hepatic/Liver Function Pnl [80076-HEPATIC] 5)  T-Vitamin D (25-Hydroxy) [95621-30865] 6)  Est. Patient Level IV [78469]    Current Allergies (reviewed today): ! SULFA  PAP Result Date:  03/13/2010 PAP Result:  historical Mammogram Result Date:  03/08/2010 Mammogram Result:  historical Last Bone Density:  abnormal (12/29/2007 1:41:04 PM) Bone Density Result Date:  03/08/2010 Bone Density Result:  historical- osteopenia

## 2011-01-15 ENCOUNTER — Ambulatory Visit (INDEPENDENT_AMBULATORY_CARE_PROVIDER_SITE_OTHER): Payer: Medicare Other | Admitting: Family Medicine

## 2011-01-15 ENCOUNTER — Encounter: Payer: Self-pay | Admitting: Family Medicine

## 2011-01-15 VITALS — BP 130/82 | HR 71 | Temp 98.3°F | Ht 62.0 in | Wt 181.4 lb

## 2011-01-15 DIAGNOSIS — E785 Hyperlipidemia, unspecified: Secondary | ICD-10-CM

## 2011-01-15 DIAGNOSIS — Z0000 Encounter for general adult medical examination without abnormal findings: Secondary | ICD-10-CM

## 2011-01-15 NOTE — Progress Notes (Deleted)
  Subjective:    Patient ID: Leah Melendez, female    DOB: [birth year], 65 y.o.   MRN: [id number]  HPI    Review of Systems     Objective:   Physical Exam        Assessment & Plan:

## 2011-01-15 NOTE — Progress Notes (Signed)
I have personally reviewed the Medicare Annual Wellness questionnaire and have noted 1. The patient's medical and social history  HLD- on Simvastatin 40 mg daily.  No complaints, no myalgias.  GERD- well controlled with as needed omeprazole.  Has OBGYN, UTD on prevention (awaiting records).  Osteopenia- per pt, recent dexa scan was stable- osteopenia 2. Their use of alcohol, tobacco or illicit drugs 3. Their current medications and supplements 4. The patient's functional ability including ADL's, fall risks, home safety risks and hearing or visual             Impairment.- see medicare form 5. Diet and physical activities- tries to remain physically active, babysits her grand daughter. 6. Evidence for depression or mood disorders- none   General:  alert, well-developed, well-nourished, well-hydrated, and overweight-appearing.  NAD Head:  normocephalic and atraumatic.   Eyes:  vision grossly intact, pupils equal, pupils round, and pupils reactive to light.   Ears:  R ear normal and L ear normal.   Nose:  no external deformity.   Mouth:  good dentition.   Neck:  No deformities, masses, or tenderness noted. Lungs:  Normal respiratory effort, chest expands symmetrically. Lungs are clear to auscultation, no crackles or wheezes. Heart:  Normal rate and regular rhythm. S1 and S2 normal without gallop, murmur, click, rub or other extra sounds. Abdomen:  soft, non-tender, and normal bowel sounds.   Msk:  No deformity or scoliosis noted of thoracic or lumbar spine.   Extremities:  no edema Neurologic:  alert & oriented X3 and cranial nerves II-XII intact.   Skin:  Intact without suspicious lesions or rashes Psych:  normally interactive and moderately anxious.    The patients weight, height, BMI and visual acuity have been recorded in the chart I have made referrals, counseling and provided education to the patient based review of the above and I have provided the pt with a written personalized  care plan for preventive services.

## 2011-01-31 LAB — COMPREHENSIVE METABOLIC PANEL
ALT: 15 U/L (ref 0–35)
Albumin: 3.8 g/dL (ref 3.5–5.2)
Alkaline Phosphatase: 81 U/L (ref 39–117)
BUN: 14 mg/dL (ref 6–23)
Chloride: 107 mEq/L (ref 96–112)
Glucose, Bld: 86 mg/dL (ref 70–99)
Potassium: 3.9 mEq/L (ref 3.5–5.1)
Sodium: 140 mEq/L (ref 135–145)
Total Bilirubin: 1.1 mg/dL (ref 0.3–1.2)

## 2011-01-31 LAB — DIFFERENTIAL
Basophils Absolute: 0 10*3/uL (ref 0.0–0.1)
Basophils Relative: 1 % (ref 0–1)
Eosinophils Absolute: 0.1 10*3/uL (ref 0.0–0.7)
Monocytes Absolute: 0.5 10*3/uL (ref 0.1–1.0)
Neutro Abs: 3.2 10*3/uL (ref 1.7–7.7)

## 2011-01-31 LAB — CBC
HCT: 39.4 % (ref 36.0–46.0)
Hemoglobin: 13.8 g/dL (ref 12.0–15.0)
Platelets: 184 10*3/uL (ref 150–400)
WBC: 5.8 10*3/uL (ref 4.0–10.5)

## 2011-02-02 ENCOUNTER — Other Ambulatory Visit: Payer: Self-pay | Admitting: Family Medicine

## 2011-03-12 NOTE — Op Note (Signed)
NAMEALEXANDR, OEHLER         ACCOUNT NO.:  [id number]   MEDICAL RECORD NO.:  [id number]          PATIENT TYPE:  AMB   LOCATION:  DSC                          FACILITY:  [facility]   PHYSICIAN:  Loreta Ave, M.D. 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  Mild fraying of lateral meniscus, middle and anterior third  without appreciable tears.   PROCEDURES:  Left knee exam under anesthesia, arthroscopy, partial  medial meniscectomy, chondroplasty of patella and medial compartment.  Removal of chondral loose bodies.   SURGEON:  Loreta Ave, MD   ASSISTANT:  Genene Churn. Barry Dienes, [place]   ANESTHESIA:  Knee block with sedation.   SPECIMENS:  None.   CULTURES:  None.   COMPLICATIONS:  None.   DRESSINGS:  Soft compressive.   PROCEDURE IN DETAIL:  The patient was brought to the operating room and  placed on operating table in the supine position.  After adequate  anesthesia had been obtained, knee examined.  A little lateral tracking  not marked, not tethering.  Full motion stable ligaments.  Tourniquet  and leg holder applied.  Leg prepped and draped in usual sterile  fashion.  Three portals created, 1 superolateral, 1 each medial and  lateral parapatellar.  Inflow catheter introduced, arthroscope  introduced, knee distended and inspected.  Chondral debris throughout  cleared.  Grade 2 and 3 change of patella debrided.  Trochlea looked  good.  A little lateral tracking, not lateral tethering.  Cruciate  ligaments intact.  Lateral compartment looked excellent and little  fraying of the meniscus in the front that was easily debrided.  Posterior horn looked good.  On  the medial side, diffuse grade 2 and 3  changes, weightbearing dome debrided.  Radial detachment of the  posterior third with marked intermeniscal tearing throughout the entire  posterior third.  Posterior third meniscus removed, tapered into  remaining meniscus yielding a solid stable meniscal tissue in  the anterior two-thirds at completion.  Entire knee examined, no other  findings appreciated.  Instruments and fluid removed.  Portals of knee  injected with Marcaine.  Portals closed with 4-0 nylon.  Sterile  compressive dressing applied.  Anesthesia reversed.  Brought to recovery  room.  Tolerated the surgery well.  No complications.      Loreta Ave, M.D.  Electronically Signed     DFM/MEDQ  D:  02/16/2009  T:  02/17/2009  Job:  [id number]

## 2011-03-15 NOTE — Op Note (Signed)
NAMETSERING, LEAMAN         ACCOUNT NO.:  [id number]   MEDICAL RECORD NO.:  [id number]           PATIENT TYPE:   LOCATION:                                 FACILITY:   PHYSICIAN:  Danise Edge, M.D.        DATE OF BIRTH:   DATE OF PROCEDURE:  02/13/2005  DATE OF DISCHARGE:                                 OPERATIVE REPORT   PROCEDURE:  Esophagogastroduodenoscopy and screening colonoscopy.   Ms. Borrelli has chronic gastroesophageal reflux and her heartburn is  controlled when she takes her Prilosec each morning. She is due for a  screening colonoscopy with polypectomy to prevent colon cancer.   ENDOSCOPIST:  Danise Edge, M.D.   PREMEDICATION:  Versed 6 mg, Demerol 60 mg.   PROCEDURE:  Esophagogastroduodenoscopy with gastric polyp biopsy and  intravenous Versed to achieve conscious sedation for the procedure:  The  patient's blood pressure, oxygen saturation and cardiac rhythm were  monitored throughout the procedure and documented in the medical record.   The Olympus gastroscope was passed through the posterior hypopharynx into  the proximal esophagus without difficulty. The hypopharynx and larynx  appeared normal. I did not visualize the vocal cords.   ESOPHAGOSCOPY:  The proximal, mid and lower segments of the esophageal  mucosa appeared normal. The squamocolumnar junction and esophagogastric  junction are noted at 36 cm from the incisor teeth. There is no endoscopic  evidence for the presence of Barrett's esophagus and was normal. From the  mid gastric body, a 1 mm polyp was removed with cold biopsy forceps. The  gastric antrum and pylorus appeared normal.   DUODENOSCOPY:  The duodenal bulb and descending duodenum appeared normal.   ASSESSMENT:  A diminutive polyp was removed from the mid gastric body;  otherwise normal esophagogastroduodenoscopy.   PROCEDURE:  Screening proctocolonoscopy to the cecum:  Anal inspection and  digital rectal exam were normal. The  Olympus adjustable pediatric  colonoscope was introduced into the rectum. Colonic preparation for the exam  today was excellent.   RECTUM:  Normal.  SIGMOID COLON AND DESCENDING COLON:  Normal.  SPLENIC FLEXURE:  Normal.  TRANSVERSE COLON:  Normal.  HEPATIC FLEXURE:  Normal.  ASCENDING COLON:  Normal.  CECUM AND ILEOCECAL VALVE:  Normal.  DISTAL ILEUM:  Normal.   ASSESSMENT:  Normal proctocolonoscopy to the cecum with inspection of the  distal ileum.       ___________________________________________  Danise Edge, M.D.    MJ/MEDQ  D:  02/13/2005  T:  02/13/2005  Job:  [id number]   cc:   Laurita Quint, M.D.  [address] [place]  [place] [address]  Fax: [phone]   [facility], [place]

## 2011-06-04 ENCOUNTER — Telehealth: Payer: Self-pay | Admitting: Gastroenterology

## 2011-06-04 ENCOUNTER — Emergency Department (HOSPITAL_COMMUNITY)
Admission: EM | Admit: 2011-06-04 | Discharge: 2011-06-04 | Disposition: A | Discharge status: Home / Self Care | Payer: Medicare Other | Attending: Emergency Medicine | Admitting: Emergency Medicine

## 2011-06-04 DIAGNOSIS — R112 Nausea with vomiting, unspecified: Secondary | ICD-10-CM | POA: Insufficient documentation

## 2011-06-04 DIAGNOSIS — K219 Gastro-esophageal reflux disease without esophagitis: Secondary | ICD-10-CM | POA: Insufficient documentation

## 2011-06-04 DIAGNOSIS — R1011 Right upper quadrant pain: Secondary | ICD-10-CM | POA: Insufficient documentation

## 2011-06-04 DIAGNOSIS — R197 Diarrhea, unspecified: Secondary | ICD-10-CM | POA: Insufficient documentation

## 2011-06-04 NOTE — Telephone Encounter (Signed)
Her sister called.  She has right sided abd pain, chills, vomitting and diarrhea of relatively acute onset.  S/p lap chole 2 years ago (normal [place], normal HIDA, "mild chronic inflammation of GB" on path.  She sounds pretty ill, i recommeded that she go to ER for evaluation.

## 2011-06-05 ENCOUNTER — Telehealth: Payer: Self-pay | Admitting: Gastroenterology

## 2011-06-05 NOTE — Telephone Encounter (Signed)
Pt states she was seen the the er last night for ruq pain, nausea and vomiting. Pt was given some pain med and something for nausea. Pt requesting an appt. Pt scheduled to see Willette Cluster NP 06/06/11@9am . Pt aware of appt date and time.

## 2011-06-06 ENCOUNTER — Other Ambulatory Visit (INDEPENDENT_AMBULATORY_CARE_PROVIDER_SITE_OTHER): Payer: Medicare Other

## 2011-06-06 ENCOUNTER — Encounter: Payer: Self-pay | Admitting: Nurse Practitioner

## 2011-06-06 ENCOUNTER — Ambulatory Visit (INDEPENDENT_AMBULATORY_CARE_PROVIDER_SITE_OTHER): Payer: Medicare Other | Admitting: Nurse Practitioner

## 2011-06-06 VITALS — BP 112/90 | HR 108 | Temp 97.5°F | Ht 61.0 in | Wt 175.0 lb

## 2011-06-06 DIAGNOSIS — R1010 Upper abdominal pain, unspecified: Secondary | ICD-10-CM

## 2011-06-06 DIAGNOSIS — R197 Diarrhea, unspecified: Secondary | ICD-10-CM

## 2011-06-06 DIAGNOSIS — R109 Unspecified abdominal pain: Secondary | ICD-10-CM

## 2011-06-06 DIAGNOSIS — R112 Nausea with vomiting, unspecified: Secondary | ICD-10-CM

## 2011-06-06 DIAGNOSIS — K219 Gastro-esophageal reflux disease without esophagitis: Secondary | ICD-10-CM

## 2011-06-06 DIAGNOSIS — R1011 Right upper quadrant pain: Secondary | ICD-10-CM | POA: Insufficient documentation

## 2011-06-06 LAB — COMPREHENSIVE METABOLIC PANEL
ALT: 24 U/L (ref 0–35)
AST: 22 U/L (ref 0–37)
Alkaline Phosphatase: 74 U/L (ref 39–117)
BUN: 12 mg/dL (ref 6–23)
Creatinine, Ser: 0.9 mg/dL (ref 0.4–1.2)
Potassium: 3.8 mEq/L (ref 3.5–5.1)

## 2011-06-06 LAB — URINALYSIS
Nitrite: NEGATIVE
Total Protein, Urine: NEGATIVE
Urine Glucose: NEGATIVE
pH: 6 (ref 5.0–8.0)

## 2011-06-06 LAB — CBC WITH DIFFERENTIAL/PLATELET
Basophils Absolute: 0 10*3/uL (ref 0.0–0.1)
Basophils Relative: 0.3 % (ref 0.0–3.0)
Eosinophils Absolute: 0 10*3/uL (ref 0.0–0.7)
Lymphocytes Relative: 30.7 % (ref 12.0–46.0)
MCHC: 33.9 g/dL (ref 30.0–36.0)
MCV: 92.3 fl (ref 78.0–100.0)
Monocytes Absolute: 0.4 10*3/uL (ref 0.1–1.0)
Neutro Abs: 3.3 10*3/uL (ref 1.4–7.7)
Neutrophils Relative %: 61.5 % (ref 43.0–77.0)
RBC: 4.66 Mil/uL (ref 3.87–5.11)
RDW: 12.6 % (ref 11.5–14.6)

## 2011-06-06 NOTE — Patient Instructions (Addendum)
Continue to drink plenty of fluids. Let us know how you are doing tomorrow morning. Call Kemon Devincenzi @ [phone], Willette Cluster ACNP 's certified medical assistant.  Called pt next day on 07-08-11 and advised Gunnar Fusi ordered an EGD with Dr. Arlyce Dice at [facility]. Date is 06-10-11. Called pt and explained the procedure and prep.  Advised her we sent more Zofran to the pharmacy and she is to take 1 tab every 6 hours around the clock.

## 2011-06-06 NOTE — Assessment & Plan Note (Addendum)
Five day history of nausea, vomiting, and epigastric pain radiating around and through to the back. Patient had loose stool at the onset of symptoms but that resolved 2 days ago. She has chills but no fever. 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 Interestingly, a review of the records shows that patient had the same symptoms (including loose stool) back in 2010 just before cholecystectomy. Will obtain basic laboratory studies (CBC, CMET and lipase). Check UA. Continue antiemetics. Continue to push fluids. If no improvement in symptoms may need imaging studies and / or EGD for further evaluation.

## 2011-06-06 NOTE — Progress Notes (Signed)
Leah Melendez [id number] 23-Nov-1945   HISTORY OR PRESENT ILLNESS : [name] is a 65 year old female known to Dr. Arlyce Dice for a history of GERD. Prior to coming under the care of Dr. Arlyce Dice, patient had an EGD and normal screening colonoscopy by Dr. Laural Benes with [facility] in 2006. She was seen here in August 2010 for loose stool, nausea right upper quadrant pain. Ultrasound unremarkable, no gallstones or evidence for cholecystitis. HIDA scan normal, ejection fraction of 95%. She was seen by Dr. Dwain Sarna in September 2010 and had cholecystectomy in October 2010. 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  Patient is here today for evaluation of acute nausea, vomiting, diarrhea and RUQ pain which started five days ago. She gives long history of "bowel problems" wih urgent, loose stools 4-5 times a day, often following meals. Stools are more runny and frequent than usual, despite decreased intake. Her RUQ pain radiates around side and into right mid back. No dysuria. No hematuria.Laying down helps. Pain does not feel like previous gallbladder pain. RUQ feels "raw". No fevers but has chills. No sick contacts. No recent antibiotics.She went to ER on Tuesday, no labs or xrays done as patient decided that she would follow up with [place] and get any necessary tests done at that time. No IVF given in the ER. Her diarrhea has subsided, none in two days. She still has nausea and RUQ pain. She is taking Zofran for nausea, Hydrocodone for pain. Not hurting enough to take pain med this morning.    Current Medications, Allergies, Past Medical History, Past Surgical History, Family History and Social History were reviewed in [employer] record.  PHYSICAL EXAMINATION : General: Well developed  female in no acute distress Head: Normocephalic and atraumatic Eyes:  sclerae anicteric,conjunctive pink. Ears: Normal auditory acuity Mouth: No deformity or  lesions Neck: Supple, no masses.  Lungs: Clear throughout to auscultation Heart: Regular rate and rhythm; no murmurs heard Abdomen: Soft, nondistended, nontender. No masses or hepatomegaly noted. Normal bowel sounds Rectal: not done Musculoskeletal: Symmetrical with no gross deformities  Skin: No lesions on visible extremities Extremities: No edema or deformities noted Neurological: Alert oriented x 4, grossly nonfocal Cervical Nodes:  No significant cervical adenopathy Psychological:  Alert and cooperative. Normal mood and affect  ASSESSMENT AND PLAN :

## 2011-06-07 ENCOUNTER — Telehealth: Payer: Self-pay | Admitting: Nurse Practitioner

## 2011-06-07 ENCOUNTER — Telehealth: Payer: Self-pay | Admitting: Internal Medicine

## 2011-06-07 ENCOUNTER — Emergency Department (HOSPITAL_COMMUNITY): Payer: Medicare Other

## 2011-06-07 ENCOUNTER — Emergency Department (HOSPITAL_COMMUNITY)
Admission: EM | Admit: 2011-06-07 | Discharge: 2011-06-08 | Disposition: A | Discharge status: Home / Self Care | Payer: Medicare Other | Attending: Emergency Medicine | Admitting: Emergency Medicine

## 2011-06-07 DIAGNOSIS — E669 Obesity, unspecified: Secondary | ICD-10-CM | POA: Insufficient documentation

## 2011-06-07 DIAGNOSIS — E7800 Pure hypercholesterolemia, unspecified: Secondary | ICD-10-CM | POA: Insufficient documentation

## 2011-06-07 DIAGNOSIS — R112 Nausea with vomiting, unspecified: Secondary | ICD-10-CM | POA: Insufficient documentation

## 2011-06-07 DIAGNOSIS — R10819 Abdominal tenderness, unspecified site: Secondary | ICD-10-CM | POA: Insufficient documentation

## 2011-06-07 DIAGNOSIS — Z79899 Other long term (current) drug therapy: Secondary | ICD-10-CM | POA: Insufficient documentation

## 2011-06-07 DIAGNOSIS — IMO0001 Reserved for inherently not codable concepts without codable children: Secondary | ICD-10-CM | POA: Insufficient documentation

## 2011-06-07 DIAGNOSIS — R109 Unspecified abdominal pain: Secondary | ICD-10-CM | POA: Insufficient documentation

## 2011-06-07 DIAGNOSIS — K219 Gastro-esophageal reflux disease without esophagitis: Secondary | ICD-10-CM | POA: Insufficient documentation

## 2011-06-07 LAB — LIPASE, BLOOD: Lipase: 24 U/L (ref 11–59)

## 2011-06-07 LAB — COMPREHENSIVE METABOLIC PANEL
Albumin: 3.9 g/dL (ref 3.5–5.2)
BUN: 14 mg/dL (ref 6–23)
Chloride: 103 mEq/L (ref 96–112)
Creatinine, Ser: 0.81 mg/dL (ref 0.50–1.10)
Total Bilirubin: 1.1 mg/dL (ref 0.3–1.2)

## 2011-06-07 LAB — CBC
HCT: 41.9 % (ref 36.0–46.0)
MCH: 31.7 pg (ref 26.0–34.0)
MCHC: 36 g/dL (ref 30.0–36.0)
MCV: 88 fL (ref 78.0–100.0)
RDW: 12.1 % (ref 11.5–15.5)
WBC: 7.2 10*3/uL (ref 4.0–10.5)

## 2011-06-07 LAB — DIFFERENTIAL
Eosinophils Relative: 1 % (ref 0–5)
Lymphocytes Relative: 39 % (ref 12–46)
Lymphs Abs: 2.8 10*3/uL (ref 0.7–4.0)
Monocytes Absolute: 0.5 10*3/uL (ref 0.1–1.0)
Monocytes Relative: 7 % (ref 3–12)

## 2011-06-07 LAB — URINALYSIS, ROUTINE W REFLEX MICROSCOPIC
Glucose, UA: NEGATIVE mg/dL
Protein, ur: NEGATIVE mg/dL
Urobilinogen, UA: 1 mg/dL (ref 0.0–1.0)

## 2011-06-07 LAB — URINE MICROSCOPIC-ADD ON

## 2011-06-07 MED ORDER — ONDANSETRON HCL 4 MG PO TABS: ORAL_TABLET | ORAL | Status: DC

## 2011-06-07 NOTE — Telephone Encounter (Signed)
Called the patient and she is still very sick. She has pain under her right breast and it goes through to her back.  On a pain scale of 1-10 she thinks it is about a 6.  She said it is very painful.  She cannot eat.  She has been sipping on Gingerale.  She said she has nausea. She has not taken a zofran since yesterday.  I advised her to take one and lay down and I will speak to Dickey when she returns from lunch.

## 2011-06-07 NOTE — Telephone Encounter (Signed)
Called by patient's sister tonight for ongoing RUQ pain, nausea, and diarrhea.  States diarrhea is "severe" and occurred 3 times today thus far.  No report of blood/melena.  Seen in office yesterday and scheduled for EGD Monday.  Sister not sure this can wait until then.  Sister reports "low grade fever", but reports it was measured at home at 98.3 F.   Given that she sounds acutely ill, I have advised she seek care in the ED. She voiced understanding, time provided for questions and answers and she thanked me for the call.

## 2011-06-07 NOTE — Assessment & Plan Note (Signed)
Assessment and plan her nausea and vomiting

## 2011-06-07 NOTE — Assessment & Plan Note (Signed)
Asymptomatic of daily PPI. No evidence of Barrett's esophagus on upper endoscopy by Dr. Laural Benes in 2006.

## 2011-06-07 NOTE — Telephone Encounter (Signed)
Gunnar Fusi called and spoke to the patient.  Gunnar Fusi asked her about how she is feeling and advised her we are going to schedule an EGD with Dr Arlyce Dice at [facility].  It is scheduled for mon 06-10-2011 at 10;30 Am.  I spoke to the pt and let her know this and told her per Gunnar Fusi to take 1 tablet of the Zofran 4 mg every 6 hours around the clock.

## 2011-06-08 ENCOUNTER — Telehealth: Payer: Self-pay | Admitting: Nurse Practitioner

## 2011-06-08 MED ORDER — IOHEXOL 300 MG/ML  SOLN
100.0000 mL | Freq: Once | INTRAMUSCULAR | Status: AC | PRN
  Administered 2011-06-08: 100 mL via INTRAVENOUS

## 2011-06-08 NOTE — Telephone Encounter (Signed)
Still nauseated, no vomiting. Still has some chills but no fevers. Her WBC is normal. Pain in RUQ is more of a "raw feeling". Reviewed labs. Her total bili is mildly elevated but not sure clinically significant. Gallbladder is out (acalculous cholecystitis). Next step is to rule out PUD so will proceed with EGD. If negative so may need imaging studies.

## 2011-06-10 ENCOUNTER — Ambulatory Visit (HOSPITAL_COMMUNITY)
Admission: RE | Admit: 2011-06-10 | Discharge: 2011-06-10 | Disposition: A | Discharge status: Home / Self Care | Payer: Medicare Other | Source: Ambulatory Visit | Attending: Gastroenterology | Admitting: Gastroenterology

## 2011-06-10 ENCOUNTER — Other Ambulatory Visit: Payer: Self-pay | Admitting: Gastroenterology

## 2011-06-10 ENCOUNTER — Encounter: Payer: Medicare Other | Admitting: Gastroenterology

## 2011-06-10 DIAGNOSIS — K219 Gastro-esophageal reflux disease without esophagitis: Secondary | ICD-10-CM | POA: Insufficient documentation

## 2011-06-10 DIAGNOSIS — R109 Unspecified abdominal pain: Secondary | ICD-10-CM | POA: Insufficient documentation

## 2011-06-10 DIAGNOSIS — R1011 Right upper quadrant pain: Secondary | ICD-10-CM

## 2011-06-10 DIAGNOSIS — Z79899 Other long term (current) drug therapy: Secondary | ICD-10-CM | POA: Insufficient documentation

## 2011-06-10 DIAGNOSIS — R112 Nausea with vomiting, unspecified: Secondary | ICD-10-CM | POA: Insufficient documentation

## 2011-06-10 MED ORDER — HYOSCYAMINE SULFATE ER 0.375 MG PO TBCR: EXTENDED_RELEASE_TABLET | ORAL | Status: DC

## 2011-06-10 NOTE — Progress Notes (Signed)
I AGREE.

## 2011-07-19 ENCOUNTER — Encounter: Payer: Self-pay | Admitting: Gastroenterology

## 2011-07-19 ENCOUNTER — Ambulatory Visit (INDEPENDENT_AMBULATORY_CARE_PROVIDER_SITE_OTHER): Payer: Medicare Other | Admitting: Gastroenterology

## 2011-07-19 DIAGNOSIS — R197 Diarrhea, unspecified: Secondary | ICD-10-CM

## 2011-07-19 DIAGNOSIS — R1011 Right upper quadrant pain: Secondary | ICD-10-CM

## 2011-07-19 MED ORDER — PEG-KCL-NACL-NASULF-NA ASC-C 100 G PO SOLR: 1.0000 | Freq: Once | ORAL | Status: DC

## 2011-07-19 NOTE — Patient Instructions (Signed)
Colonoscopy A colonoscopy is an exam to evaluate your entire colon. In this exam, your colon is cleansed. A long fiberoptic tube is inserted through your rectum and into your colon. The fiberoptic scope (endoscope) is a long bundle of enclosed and very flexible fibers. These fibers transmit light to the area examined and send images from that area to your caregiver. Discomfort is usually minimal. You may be given a drug to help you sleep (sedative) during or prior to the procedure. This exam helps to detect lumps (tumors), polyps, inflammation, and areas of bleeding. Your caregiver may also take a small piece of tissue (biopsy) that will be examined under a microscope. BEFORE THE PROCEDURE  A clear liquid diet may be required for 2 days before the exam.   Liquid injections (enemas) or laxatives may be required.   A large amount of electrolyte solution may be given to you to drink over a short period of time. This solution is used to clean out your colon.   You should be present 1 prior to your procedure or as directed by your caregiver.   Check in at the admissions desk to fill out necessary forms if not preregistered. There will be consent forms to sign prior to the procedure. If accompanied by friends or family, there is a waiting area for them while you are having your procedure.  LET YOUR CAREGIVER KNOW ABOUT:  Allergies to food or medicine.  Medicines taken, including vitamins, herbs, eyedrops, over-the-counter medicines, and creams.   Use of steroids (by mouth or creams).   Previous problems with anesthetics or numbing medicines.   History of bleeding problems or blood clots.  Previous surgery.   Other health problems, including diabetes and kidney problems.   Possibility of pregnancy, if this applies.   AFTER THE PROCEDURE  If you received a sedative and/or pain medicine, you will need to arrange for someone to drive you home.   Occasionally, there is a little blood passed  with the first bowel movement. DO NOT be concerned.  HOME CARE INSTRUCTIONS  It is not unusual to pass moderate amounts of gas and experience mild abdominal cramping following the procedure. This is due to air being used to inflate your colon during the exam. Walking or a warm pack on your belly (abdomen) may help.   You may resume all normal meals and activities after sedatives and medicines have worn off.   Only take over-the-counter or prescription medicines for pain, discomfort, or fever as directed by your caregiver. DO NOT use aspirin or blood thinners if a biopsy was taken. Consult your caregiver for medicine usage if biopsies were taken.  FINDING OUT THE RESULTS OF YOUR TEST Not all test results are available during your visit. If your test results are not back during the visit, make an appointment with your caregiver to find out the results. Do not assume everything is normal if you have not heard from your caregiver or the medical facility. It is important for you to follow up on all of your test results. SEEK IMMEDIATE MEDICAL CARE IF:  You pass large blood clots or fill a toilet with blood following the procedure. This may also occur 10 to 14 days following the procedure. This is more likely if a biopsy was taken.   You develop abdominal pain that keeps getting worse and cannot be relieved with medicine.  Document Released: 10/11/2000 Document Re-Released: 01/08/2010 [facility] Patient Information 2011 [place], [place]. yOUR COLON IS SCHEDULED ON 07/25/2011

## 2011-07-19 NOTE — Assessment & Plan Note (Signed)
Etiology for pain and diarrhea is uncertain but seems most compatible with an acute gastroenteritis. Workup including LFTs, amylase, and endoscopy and CT scan were unremarkable.  Recommendations #1 should symptoms recur and she was instructed to contact the office for a stat evaluation

## 2011-07-19 NOTE — Progress Notes (Signed)
History of Present Illness:  Leah Melendez has returned for followup of her abdominal pain and diarrhea. Upper endoscopy was negative. Since her last visit she's had only one limited episode of diarrhea. She is without abdominal pain.    Review of Systems: Pertinent positive and negative review of systems were noted in the above HPI section. All other review of systems were otherwise negative.    Current Medications, Allergies, Past Medical History, Past Surgical History, Family History and Social History were reviewed in [employer] electronic medical record  Vital signs were reviewed in today's medical record. Physical Exam: General: Well developed , well nourished, no acute distress

## 2011-07-19 NOTE — Assessment & Plan Note (Signed)
She suffers from intermittent diarrhea. Last colonoscopy 2006 was normal.  Recommendations #1 colonoscopy

## 2011-07-25 ENCOUNTER — Encounter: Payer: Self-pay | Admitting: Gastroenterology

## 2011-07-25 ENCOUNTER — Ambulatory Visit (AMBULATORY_SURGERY_CENTER): Payer: Medicare Other | Admitting: Gastroenterology

## 2011-07-25 DIAGNOSIS — R197 Diarrhea, unspecified: Secondary | ICD-10-CM

## 2011-07-25 DIAGNOSIS — R109 Unspecified abdominal pain: Secondary | ICD-10-CM

## 2011-07-25 DIAGNOSIS — R1011 Right upper quadrant pain: Secondary | ICD-10-CM

## 2011-07-25 MED ORDER — SODIUM CHLORIDE 0.9 % IV SOLN: 500.0000 mL | INTRAVENOUS | Status: DC

## 2011-07-25 NOTE — Patient Instructions (Addendum)
DISCHARGE INSTRUCTIONS PER BLUE AND GREEN SHEETS  WE WILL MAIL YOU A LETTER IN 1-2 WEEKS WITH YOUR PATHOLOGY RESULTS AND DR Marzetta Board RECOMMENDATIONS FROM TODAYS EXAM.  DISCHARGE INSTRUCTIONS GIVEN WITH  VERBAL UNDERSTANDING. BIOPSIES TAKEN. RESUME PREVIOUS MEDICATIONS.

## 2011-07-26 ENCOUNTER — Telehealth: Payer: Self-pay | Admitting: *Deleted

## 2011-07-26 NOTE — Telephone Encounter (Signed)

## 2011-08-20 ENCOUNTER — Ambulatory Visit (INDEPENDENT_AMBULATORY_CARE_PROVIDER_SITE_OTHER): Payer: Medicare Other | Admitting: Gastroenterology

## 2011-08-20 ENCOUNTER — Encounter: Payer: Self-pay | Admitting: Gastroenterology

## 2011-08-20 VITALS — BP 108/64 | HR 68 | Ht 61.0 in | Wt 176.0 lb

## 2011-08-20 DIAGNOSIS — R197 Diarrhea, unspecified: Secondary | ICD-10-CM

## 2011-08-20 NOTE — Patient Instructions (Signed)
Follow up as needed

## 2011-08-20 NOTE — Progress Notes (Signed)
History of Present Illness:  Leah Melendez has returned for followup of her abdominal pain, nausea and diarrhea. Colonoscopy was entirely normal. Random biopsies were negative. She currently is feeling perfectly well and has no GI complaints.    Review of Systems: Pertinent positive and negative review of systems were noted in the above HPI section. All other review of systems were otherwise negative.    Current Medications, Allergies, Past Medical History, Past Surgical History, Family History and Social History were reviewed in [employer] electronic medical record  Vital signs were reviewed in today's medical record. Physical Exam: General: Well developed , well nourished, no acute distress

## 2011-08-20 NOTE — Assessment & Plan Note (Signed)
She may have had a gastroenteritis which clinically has resolved.

## 2011-08-21 DIAGNOSIS — R197 Diarrhea, unspecified: Secondary | ICD-10-CM | POA: Insufficient documentation

## 2011-09-24 ENCOUNTER — Telehealth: Payer: Self-pay | Admitting: *Deleted

## 2011-09-24 ENCOUNTER — Ambulatory Visit (INDEPENDENT_AMBULATORY_CARE_PROVIDER_SITE_OTHER): Payer: Medicare Other | Admitting: Family Medicine

## 2011-09-24 ENCOUNTER — Encounter: Payer: Self-pay | Admitting: Family Medicine

## 2011-09-24 VITALS — BP 122/80 | HR 76 | Temp 98.5°F | Ht 61.0 in | Wt 175.8 lb

## 2011-09-24 DIAGNOSIS — K219 Gastro-esophageal reflux disease without esophagitis: Secondary | ICD-10-CM

## 2011-09-24 MED ORDER — OMEPRAZOLE 40 MG PO CPDR: 40.0000 mg | DELAYED_RELEASE_CAPSULE | Freq: Every day | ORAL | Status: DC

## 2011-09-24 NOTE — Telephone Encounter (Signed)
In my box

## 2011-09-24 NOTE — Telephone Encounter (Signed)
Prior auth is needed for omeprazole, form is on your desk. 

## 2011-09-24 NOTE — Progress Notes (Signed)
SUBJECTIVE: Leah SCHUMAN is a 65 y.o. female who complains of GERD type symptoms. She has been experiencing heartburn, bilious reflux, waterbrash, upper abdominal discomfort, cough for many year(s), constant over time.  Has been on Omeprazole for years and would it refilled- pharmacy told her she had to see me first.  Had endoscopy 9/27, Dr. Arlyce Dice- unremarkable. Episode of vomiting and diarrhea in October, has resolved.  Current Outpatient Prescriptions  Medication Sig Dispense Refill  . Ascorbic Acid (VITAMIN C) 500 MG tablet Take 500 mg by mouth daily.        Marland Kitchen b complex vitamins tablet Take 1 tablet by mouth daily.        . fish oil-omega-3 fatty acids 1000 MG capsule Take one capsule by mouth four times a day       . loratadine (CLARITIN) 10 MG tablet Take 10 mg by mouth daily as needed.       Marland Kitchen omeprazole (PRILOSEC) 40 MG capsule Take 1 capsule (40 mg total) by mouth daily.  30 capsule  6  . simvastatin (ZOCOR) 40 MG tablet take 1 tablet by mouth once daily for cholesterol  30 tablet  10  . Cholecalciferol ([employer] HIGH POTENCY VITAMIN D) 1000 UNITS tablet Take 1,000 Units by mouth daily.          OBJECTIVE:\BP 122/80  Pulse 76  Temp(Src) 98.5 F (36.9 C) (Oral)  Ht 5\' 1"  (1.549 m)  Wt 175 lb 12 oz (79.72 kg)  BMI 33.21 kg/m2  Appears well, alert and oriented x 3, pleasant cooperative in NAD. Anicteric. Vitals as noted. Neck free of lymphadenopathy or mass. Abdomen - abdomen is soft without significant tenderness, masses, organomegaly or guarding..  ASSESSMENT: GERD   PLAN: The pathophysiology of reflux is discussed.  Anti-reflux measures such as raising the head of the bed, avoiding tight clothing or belts, avoiding eating late at night and not lying down shortly after mealtime and achieving weight loss are discussed. Avoid ASA, NSAID's, caffeine, peppermints, alcohol and tobacco. OTC H2 blockers and/or antacids are often very helpful for PRN use. However, for persisting  chronic or daily symptoms, prescription strength H2 blockers or a trial of PPI's are often used. Further recommendations to her: Rx for PPI is written. She should alert me if there are persistent symptoms, dysphagia, weight loss or GI bleeding. FUV is scheduled.

## 2011-09-24 NOTE — Telephone Encounter (Signed)
Form faxed to [employer].

## 2011-10-10 ENCOUNTER — Telehealth: Payer: Self-pay | Admitting: *Deleted

## 2011-10-10 NOTE — Telephone Encounter (Signed)
Received Prior Authorization approval from [employer] for Omeprazole Capsules.  Approved from 09/03/2011 through 09/23/2012.  Sent to be scanned into patients chart.

## 2011-12-09 ENCOUNTER — Ambulatory Visit (INDEPENDENT_AMBULATORY_CARE_PROVIDER_SITE_OTHER)
Admission: RE | Admit: 2011-12-09 | Discharge: 2011-12-09 | Disposition: A | Discharge status: Home / Self Care | Payer: Medicare Other | Source: Ambulatory Visit | Attending: Family Medicine | Admitting: Family Medicine

## 2011-12-09 ENCOUNTER — Encounter: Payer: Self-pay | Admitting: Family Medicine

## 2011-12-09 ENCOUNTER — Ambulatory Visit (INDEPENDENT_AMBULATORY_CARE_PROVIDER_SITE_OTHER): Payer: Medicare Other | Admitting: Family Medicine

## 2011-12-09 DIAGNOSIS — M5412 Radiculopathy, cervical region: Secondary | ICD-10-CM

## 2011-12-09 DIAGNOSIS — M719 Bursopathy, unspecified: Secondary | ICD-10-CM

## 2011-12-09 DIAGNOSIS — M75102 Unspecified rotator cuff tear or rupture of left shoulder, not specified as traumatic: Secondary | ICD-10-CM

## 2011-12-09 MED ORDER — PREDNISONE 20 MG PO TABS: ORAL_TABLET | ORAL | Status: DC

## 2011-12-09 MED ORDER — CYCLOBENZAPRINE HCL 10 MG PO TABS: 10.0000 mg | ORAL_TABLET | Freq: Three times a day (TID) | ORAL | Status: AC | PRN

## 2011-12-09 NOTE — Patient Instructions (Signed)
REFERRAL: GO THE THE FRONT ROOM AT THE ENTRANCE OF OUR CLINIC, NEAR CHECK IN. ASK FOR MARION. SHE WILL HELP YOU SET UP YOUR REFERRAL. DATE: TIME:   Recheck in 6 weeks 

## 2011-12-09 NOTE — Progress Notes (Signed)
  Patient Name: Leah Melendez Date of Birth: [birth year] Age: 66 y.o. Medical Record Number: [id number] Gender: female Date of Encounter: 12/09/2011  History of Present Illness:  Leah Melendez is a 66 y.o. very pleasant female patient who presents with the following:  Left shoulder -- has been bothering her since October. Since Ibuprofen at night. Constant dull pain laterally and some shoulder blade pain and neck pain. Has done some heat. 9 mos - 9 1/2 year old grandchildren.   Worked at a school years ago.   Mostly pain in the shoulder blade, some in the posterior parascapular region, as well as some occasional pain and tingling going down her left arm. Mild pain with abduction, but this is posterior. Range of motion is essentially full. No prior trauma, fracture, dislocation, or operative intervention.  Past Medical History, Surgical History, Social History, Family History, Problem List, Medications, and Allergies have been reviewed and updated if relevant.  Review of Systems:  GEN: No fevers, chills. Nontoxic. Primarily MSK c/o today. MSK: Detailed in the HPI GI: tolerating PO intake without difficulty Neuro: No numbness, parasthesias, or tingling associated. Otherwise the pertinent positives of the ROS are noted above.    Physical Examination: Filed Vitals:   12/09/11 1159  BP: 120/70  Pulse: 90  Temp: 97.7 F (36.5 C)  TempSrc: Oral  Height: 5\' 1"  (1.549 m)  Weight: 180 lb 6.4 oz (81.829 kg)  SpO2: 100%    Body mass index is 34.09 kg/(m^2).   GEN: Well-developed,well-nourished,in no acute distress; alert,appropriate and cooperative throughout examination HEENT: Normocephalic and atraumatic without obvious abnormalities. Ears, externally no deformities PULM: Breathing comfortably in no respiratory distress EXT: No clubbing, cyanosis, or edema PSYCH: Normally interactive. Cooperative during the interview. Pleasant. Friendly and conversant. Not anxious or  depressed appearing. Normal, full affect.  CERVICAL SPINE EXAM Range of motion: Flexion, extension, lateral bending, and rotation: Extensive loss of motion in all directions. Pain with all motion. At a minimum a loss of 25% of motion in each plane of movement. Pain with terminal motion: yes Spinous Processes: NT SCM: NT Upper paracervical muscles: Diffusely TTP Upper traps: TTP C5-T1 intact, sensation and motor  Shoulder strength 5/5. Juanetta Gosling is pos. Near negative. Crossover test mildly positive. Speeds is negative. Yergason's negative. Crank maneuver is negative.   Assessment and Plan: 1. Cervical radiculopathy  predniSONE (DELTASONE) 20 MG tablet, cyclobenzaprine (FLEXERIL) 10 MG tablet, DG Shoulder Left, DG Cervical Spine Complete, Ambulatory referral to Physical Therapy  2. Rotator cuff syndrome of left shoulder  predniSONE (DELTASONE) 20 MG tablet, cyclobenzaprine (FLEXERIL) 10 MG tablet, DG Shoulder Left, DG Cervical Spine Complete, Ambulatory referral to Physical Therapy     I suspect that this is mostly coming from her neck. Significant shoulder blade pain as well as some particular op day. She has some mild rotator cuff tendinitis.  Long prednisone burst, Flexeril, formal physical therapy poor spine rehabilitation as well as some cuff rehabilitation.

## 2012-01-16 ENCOUNTER — Other Ambulatory Visit: Payer: Self-pay | Admitting: *Deleted

## 2012-01-16 MED ORDER — SIMVASTATIN 40 MG PO TABS: 40.0000 mg | ORAL_TABLET | Freq: Every day | ORAL | Status: DC

## 2012-01-20 ENCOUNTER — Encounter: Payer: Self-pay | Admitting: Family Medicine

## 2012-01-20 ENCOUNTER — Ambulatory Visit (INDEPENDENT_AMBULATORY_CARE_PROVIDER_SITE_OTHER): Payer: Medicare Other | Admitting: Family Medicine

## 2012-01-20 VITALS — BP 110/72 | HR 97 | Temp 98.5°F | Ht 61.0 in | Wt 180.8 lb

## 2012-01-20 DIAGNOSIS — M5412 Radiculopathy, cervical region: Secondary | ICD-10-CM

## 2012-01-20 NOTE — Progress Notes (Signed)
  Patient Name: Leah Melendez Date of Birth: 1946-08-22 Age: 66 y.o. Medical Record Number: [id number] Gender: female Date of Encounter: 01/20/2012  History of Present Illness:  Leah Melendez is a 66 y.o. very pleasant female patient who presents with the following:  Sore a little from traction, but no pain down the arm at all.  L neck better  followup on having some cervical radiculopathy and some mild cuff tendinopathy. She has been doing physical therapy and doing traction. The radicular symptoms she was having on her neck are dramatically improved. Those are gone at this point. She feels much better.  She has been compliant with her home exercise program.  She also now is having some back pain and some occasional radiculopathy in her lower extremities. No foot drop. No bowel or bladder incontinence. No saddle anesthesia.   Past Medical History, Surgical History, Social History, Family History, Problem List, Medications, and Allergies have been reviewed and updated if relevant.  Review of Systems:  GEN: No fevers, chills. Nontoxic. Primarily MSK c/o today. MSK: Detailed in the HPI GI: tolerating PO intake without difficulty Neuro: detailed above Otherwise the pertinent positives of the ROS are noted above.    Physical Examination: Filed Vitals:   01/20/12 1101  BP: 110/72  Pulse: 97  Temp: 98.5 F (36.9 C)  TempSrc: Oral  Height: 5\' 1"  (1.549 m)  Weight: 180 lb 12.8 oz (82.01 kg)  SpO2: 98%    Body mass index is 34.16 kg/(m^2).   GEN: Well-developed,well-nourished,in no acute distress; alert,appropriate and cooperative throughout examination HEENT: Normocephalic and atraumatic without obvious abnormalities. Ears, externally no deformities PULM: Breathing comfortably in no respiratory distress EXT: No clubbing, cyanosis, or edema PSYCH: Normally interactive. Cooperative during the interview. Pleasant. Friendly and conversant. Not anxious or  depressed appearing. Normal, full affect.  CERVICAL SPINE EXAM Pain with terminal motion: no Spinous Processes: NT SCM: NT Upper paracervical muscles: nt spurlings neg Upper traps: NT C5-T1 intact, sensation and motor  Range of motion at  the waist: Flexion: normal Extension: normal Lateral bending: normal Rotation: all normal  No echymosis or edema Rises to examination table with no difficulty Gait: non antalgic  Inspection/Deformity: N Paraspinus Tenderness: minimal TTP buttocks  B Ankle Dorsiflexion (L5,4): 5/5 B Great Toe Dorsiflexion (L5,4): 5/5 Heel Walk (L5): WNL Toe Walk (S1): WNL Rise/Squat (L4): WNL  SENSORY B Medial Foot (L4): WNL B Dorsum (L5): WNL B Lateral (S1): WNL Light Touch: WNL Pinprick: WNL  REFLEXES Knee (L4): 2+ Ankle (S1): 2+  B SLR, seated: neg B SLR, supine: neg B FABER: neg B Reverse FABER: neg B Greater Troch: NT B Log Roll: neg B Stork: NT B Sciatic Notch: NT   Assessment and Plan: 1. Cervical radiculopathy     Improved.  Mild buttocks tenderness. Suspect irritation of the sciatic nerve. Cannot exclude impingement at the lumbar spine versus a piriformis-type phenomenon. For now, range of motion at the hip and stability reviewed

## 2012-01-20 NOTE — Patient Instructions (Signed)

## 2012-04-12 ENCOUNTER — Other Ambulatory Visit: Payer: Self-pay | Admitting: Family Medicine

## 2012-05-11 ENCOUNTER — Other Ambulatory Visit: Payer: Self-pay | Admitting: Family Medicine

## 2012-09-10 ENCOUNTER — Other Ambulatory Visit: Payer: Self-pay | Admitting: Family Medicine

## 2012-09-17 ENCOUNTER — Encounter: Payer: Self-pay | Admitting: Internal Medicine

## 2012-09-17 ENCOUNTER — Ambulatory Visit (INDEPENDENT_AMBULATORY_CARE_PROVIDER_SITE_OTHER): Payer: Medicare Other | Admitting: Internal Medicine

## 2012-09-17 VITALS — BP 118/72 | HR 108 | Temp 98.4°F | Wt 175.0 lb

## 2012-09-17 DIAGNOSIS — J019 Acute sinusitis, unspecified: Secondary | ICD-10-CM

## 2012-09-17 DIAGNOSIS — E782 Mixed hyperlipidemia: Secondary | ICD-10-CM

## 2012-09-17 MED ORDER — AMOXICILLIN 500 MG PO TABS: 1000.0000 mg | ORAL_TABLET | Freq: Two times a day (BID) | ORAL | Status: DC

## 2012-09-17 NOTE — Patient Instructions (Signed)
Please try tylenol or aleve for the sinus pain. If you worsen with more drainage, cough or fever--start the antibiotic

## 2012-09-17 NOTE — Progress Notes (Signed)
  Subjective:    Patient ID: Leah Melendez, female    DOB: [birth year], 66 y.o.   MRN: [id number]  HPI "It hit me really hard" Got really sick last night after being out with sister Facial and teeth pain  Rhinorrhea goes back 3 days or so Some ear pain yesterday also No fever Some chills--no sweats or shakes Can feel the post nasal drip  A little cough due to drip Not SOB  Hasn't used any meds Gargled with aspirin (melted)  Current Outpatient Prescriptions on File Prior to Visit  Medication Sig Dispense Refill  . Ascorbic Acid (VITAMIN C) 500 MG tablet Take 500 mg by mouth daily.        Marland Kitchen b complex vitamins tablet Take 1 tablet by mouth daily.        . Cholecalciferol ([facility] HIGH POTENCY VITAMIN D) 1000 UNITS tablet Take 1,000 Units by mouth daily.        . fish oil-omega-3 fatty acids 1000 MG capsule Take one capsule by mouth four times a day       . loratadine (CLARITIN) 10 MG tablet Take 10 mg by mouth daily as needed.       Marland Kitchen omeprazole (PRILOSEC) 40 MG capsule take 1 capsule by mouth once daily  30 capsule  0  . simvastatin (ZOCOR) 40 MG tablet take 1 tablet by mouth once daily  30 tablet  0    Allergies  Allergen Reactions  . Sulfonamide Derivatives     REACTION: as child    Past Medical History  Diagnosis Date  . GERD (gastroesophageal reflux disease)   . Hyperlipidemia   . Hemangioma 2010  . Fatty liver 2010  . H. pylori infection     Past Surgical History  Procedure Date  . Knee surgery 06/29/2007    Meniscus tear, left knee surgery  . Cholecystectomy     Family History  Problem Relation Age of Onset  . Heart disease Paternal Grandmother   . Heart disease Paternal Grandfather   . Colon cancer Neg Hx     History   Social History  . Marital Status: Married    Spouse Name: N/A    Number of Children: 2  . Years of Education: N/A   Occupational History  . Retired Engineer, building services    Social History Main Topics  . Smoking  status: Never Smoker   . Smokeless tobacco: Never Used  . Alcohol Use: No  . Drug Use: No  . Sexually Active: Not on file   Other Topics Concern  . Not on file   Social History Narrative  . No narrative on file   Review of Systems No rash Mild nausea she relates to the drainage No vomiting or diarrhea Appetite is off     Objective:   Physical Exam  Constitutional: She appears well-developed and well-nourished. No distress.  HENT:  Mouth/Throat: Oropharynx is clear and moist. No oropharyngeal exudate.       No specific sinus tenderness Mild nasal congestion TMs normal  Neck: Normal range of motion. Neck supple.  Pulmonary/Chest: Effort normal and breath sounds normal. No respiratory distress. She has no wheezes. She has no rales.  Lymphadenopathy:    She has no cervical adenopathy.          Assessment & Plan:

## 2012-09-17 NOTE — Assessment & Plan Note (Signed)
Overdue for labs Will do today

## 2012-09-17 NOTE — Assessment & Plan Note (Signed)
Still may be viral Discussed supportive care with analgesics Start the antibiotic if worsens

## 2012-09-18 LAB — CBC WITH DIFFERENTIAL/PLATELET
Eosinophils Absolute: 0.1 10*3/uL (ref 0.0–0.7)
Lymphs Abs: 1.9 10*3/uL (ref 0.7–4.0)
MCHC: 33 g/dL (ref 30.0–36.0)
MCV: 93.2 fl (ref 78.0–100.0)
Monocytes Absolute: 0.5 10*3/uL (ref 0.1–1.0)
Neutrophils Relative %: 66.9 % (ref 43.0–77.0)
Platelets: 179 10*3/uL (ref 150.0–400.0)
RDW: 12.4 % (ref 11.5–14.6)

## 2012-09-18 LAB — LIPID PANEL
Cholesterol: 192 mg/dL (ref 0–200)
LDL Cholesterol: 114 mg/dL — ABNORMAL HIGH (ref 0–99)
Triglycerides: 164 mg/dL — ABNORMAL HIGH (ref 0.0–149.0)

## 2012-09-18 LAB — BASIC METABOLIC PANEL
BUN: 15 mg/dL (ref 6–23)
CO2: 27 mEq/L (ref 19–32)
Chloride: 104 mEq/L (ref 96–112)
Creatinine, Ser: 0.8 mg/dL (ref 0.4–1.2)
Glucose, Bld: 104 mg/dL — ABNORMAL HIGH (ref 70–99)

## 2012-09-18 LAB — HEPATIC FUNCTION PANEL
Albumin: 4 g/dL (ref 3.5–5.2)
Total Bilirubin: 1.1 mg/dL (ref 0.3–1.2)

## 2012-09-18 LAB — TSH: TSH: 2.56 u[IU]/mL (ref 0.35–5.50)

## 2012-09-21 ENCOUNTER — Encounter: Payer: Self-pay | Admitting: *Deleted

## 2012-10-15 ENCOUNTER — Other Ambulatory Visit: Payer: Self-pay | Admitting: Family Medicine

## 2012-10-16 NOTE — Telephone Encounter (Signed)
This patient hasn't been seen by you since 08/2011, but she saw Dr Alphonsus Sias last month and he did cholesterol labs.  Ok to refill once on both meds and bring patient in for follow up with you?

## 2012-10-16 NOTE — Telephone Encounter (Signed)
Yes ok to refill 

## 2012-10-28 DIAGNOSIS — C4491 Basal cell carcinoma of skin, unspecified: Secondary | ICD-10-CM

## 2012-10-28 HISTORY — DX: Basal cell carcinoma of skin, unspecified: C44.91

## 2012-11-11 ENCOUNTER — Other Ambulatory Visit: Payer: Self-pay | Admitting: Family Medicine

## 2012-12-29 ENCOUNTER — Other Ambulatory Visit: Payer: Self-pay | Admitting: Family Medicine

## 2012-12-29 DIAGNOSIS — Z0000 Encounter for general adult medical examination without abnormal findings: Secondary | ICD-10-CM

## 2012-12-29 DIAGNOSIS — E782 Mixed hyperlipidemia: Secondary | ICD-10-CM

## 2013-01-06 ENCOUNTER — Other Ambulatory Visit (INDEPENDENT_AMBULATORY_CARE_PROVIDER_SITE_OTHER): Payer: Medicare Other

## 2013-01-06 DIAGNOSIS — E782 Mixed hyperlipidemia: Secondary | ICD-10-CM

## 2013-01-06 LAB — COMPREHENSIVE METABOLIC PANEL
ALT: 17 U/L (ref 0–35)
Albumin: 3.7 g/dL (ref 3.5–5.2)
Alkaline Phosphatase: 64 U/L (ref 39–117)
Glucose, Bld: 90 mg/dL (ref 70–99)
Potassium: 4 mEq/L (ref 3.5–5.1)
Sodium: 140 mEq/L (ref 135–145)
Total Protein: 7.1 g/dL (ref 6.0–8.3)

## 2013-01-06 LAB — CBC WITH DIFFERENTIAL/PLATELET
Basophils Absolute: 0 10*3/uL (ref 0.0–0.1)
Eosinophils Relative: 2.7 % (ref 0.0–5.0)
MCV: 90.5 fl (ref 78.0–100.0)
Monocytes Absolute: 0.3 10*3/uL (ref 0.1–1.0)
Monocytes Relative: 7.4 % (ref 3.0–12.0)
Neutrophils Relative %: 55.9 % (ref 43.0–77.0)
Platelets: 159 10*3/uL (ref 150.0–400.0)
WBC: 4.7 10*3/uL (ref 4.5–10.5)

## 2013-01-06 LAB — LIPID PANEL
Cholesterol: 181 mg/dL (ref 0–200)
LDL Cholesterol: 110 mg/dL — ABNORMAL HIGH (ref 0–99)
Total CHOL/HDL Ratio: 4

## 2013-01-13 ENCOUNTER — Ambulatory Visit (INDEPENDENT_AMBULATORY_CARE_PROVIDER_SITE_OTHER): Payer: Medicare Other | Admitting: Family Medicine

## 2013-01-13 ENCOUNTER — Encounter: Payer: Self-pay | Admitting: Family Medicine

## 2013-01-13 VITALS — BP 138/90 | HR 80 | Temp 98.1°F | Ht 61.75 in | Wt 177.0 lb

## 2013-01-13 DIAGNOSIS — M899 Disorder of bone, unspecified: Secondary | ICD-10-CM

## 2013-01-13 DIAGNOSIS — Z0000 Encounter for general adult medical examination without abnormal findings: Secondary | ICD-10-CM

## 2013-01-13 DIAGNOSIS — K219 Gastro-esophageal reflux disease without esophagitis: Secondary | ICD-10-CM

## 2013-01-13 NOTE — Addendum Note (Signed)
Addended by: Eliezer Bottom on: 01/13/2013 10:25 AM   Modules accepted: Orders

## 2013-01-13 NOTE — Progress Notes (Signed)
Very pleasant 67 yo female here for annual medicare wellness visit.  I have personally reviewed the Medicare Annual Wellness questionnaire and have noted 1. The patient's medical and social history 2. Their use of alcohol, tobacco or illicit drugs 3. Their current medications and supplements 4. The patient's functional ability including ADL's, fall risks, home safety risks and hearing or visual             impairment. 5. Diet and physical activities 6. Evidence for depression or mood disorders  End of life wishes discussed and updated in Social History.  Doing well. She is really enjoying her retirement.  Travels with church friends and babysits her grand children.  Husband is having another cardiac cath tomorrow but she is not too worried about him.   HLD- on Simvastatin 40 mg daily.  No complaints, no myalgias. Lab Results  Component Value Date   CHOL 181 01/06/2013   HDL 47.50 01/06/2013   LDLCALC 110* 01/06/2013   TRIG 120.0 01/06/2013   CHOLHDL 4 01/06/2013    GERD- well controlled with as needed omeprazole.  Has OBGYN, Due for mammogram- she will call [facility] for appointment.  Osteopenia- per pt, recent dexa scan was stable- osteopenia  Patient Active Problem List  Diagnosis  . HYPERLIPIDEMIA, MIXED  . GERD  . HIATAL HERNIA  . OSTEOPENIA  . Routine general medical examination at a health care facility   Past Medical History  Diagnosis Date  . GERD (gastroesophageal reflux disease)   . Hyperlipidemia   . Hemangioma 2010  . Fatty liver 2010  . H. pylori infection    Past Surgical History  Procedure Laterality Date  . Knee surgery  06/29/2007    Meniscus tear, left knee surgery  . Cholecystectomy     History  Substance Use Topics  . Smoking status: Never Smoker   . Smokeless tobacco: Never Used  . Alcohol Use: No   Family History  Problem Relation Age of Onset  . Heart disease Paternal Grandmother   . Heart disease Paternal Grandfather   .  Colon cancer Neg Hx    Allergies  Allergen Reactions  . Sulfonamide Derivatives     REACTION: as child   Current Outpatient Prescriptions on File Prior to Visit  Medication Sig Dispense Refill  . amoxicillin (AMOXIL) 500 MG tablet Take 2 tablets (1,000 mg total) by mouth 2 (two) times daily.  40 tablet  0  . Ascorbic Acid (VITAMIN C) 500 MG tablet Take 500 mg by mouth daily.        Marland Kitchen b complex vitamins tablet Take 1 tablet by mouth daily.        . Cholecalciferol (CVS HIGH POTENCY VITAMIN D) 1000 UNITS tablet Take 1,000 Units by mouth daily.        . fish oil-omega-3 fatty acids 1000 MG capsule Take one capsule by mouth four times a day       . loratadine (CLARITIN) 10 MG tablet Take 10 mg by mouth daily as needed.       Marland Kitchen omeprazole (PRILOSEC) 40 MG capsule take 1 capsule by mouth once daily  30 capsule  2  . simvastatin (ZOCOR) 40 MG tablet take 1 tablet by mouth once daily  30 tablet  5   No current facility-administered medications on file prior to visit.   The PMH, PSH, Social History, Family History, Medications, and allergies have been reviewed in [facility], and have been updated if relevant.  ROS: See HPI Patient  reports no  vision/ hearing changes,anorexia, weight change, fever ,adenopathy, persistant / recurrent hoarseness, swallowing issues, chest pain, edema,persistant / recurrent cough, hemoptysis, dyspnea(rest, exertional, paroxysmal nocturnal), gastrointestinal  bleeding (melena, rectal bleeding), abdominal pain, excessive heart burn, GU symptoms(dysuria, hematuria, pyuria, voiding/incontinence  Issues) syncope, focal weakness, severe memory loss, concerning skin lesions, depression, anxiety, abnormal bruising/bleeding, major joint swelling, breast masses or abnormal vaginal bleeding.    Physical exam: BP 138/90  Pulse 80  Temp(Src) 98.1 F (36.7 C)  Ht 5' 1.75" (1.568 m)  Wt 177 lb (80.287 kg)  BMI 32.66 kg/m2  Wt Readings from Last 3 Encounters:  01/13/13 177 lb (80.287  kg)  09/17/12 175 lb (79.379 kg)  01/20/12 180 lb 12.8 oz (82.01 kg)    General:  alert, well-developed, well-nourished, well-hydrated, and overweight-appearing.  NAD Head:  normocephalic and atraumatic.   Eyes:  vision grossly intact, pupils equal, pupils round, and pupils reactive to light.   Ears:  R ear normal and L ear normal.   Nose:  no external deformity.   Mouth:  good dentition.   Neck:  No deformities, masses, or tenderness noted. Lungs:  Normal respiratory effort, chest expands symmetrically. Lungs are clear to auscultation, no crackles or wheezes. Heart:  Normal rate and regular rhythm. S1 and S2 normal without gallop, murmur, click, rub or other extra sounds. Abdomen:  soft, non-tender, and normal bowel sounds.   Msk:  No deformity or scoliosis noted of thoracic or lumbar spine.   Extremities:  no edema Neurologic:  alert & oriented X3 and cranial nerves II-XII intact.   Skin:  Intact without suspicious lesions or rashes Psych:  normally interactive and moderately anxious.    Assessment and Plan:  1. Routine general medical examination at a health care facility The patients weight, height, BMI and visual acuity have been recorded in the chart I have made referrals, counseling and provided education to the patient based review of the above and I have provided the pt with a written personalized care plan for preventive services.   2. HYPERLIPIDEMIA, MIXED Stable on Zocor.  3. GERD Symptoms improved with improved diet.  4. OSTEOPENIA Due for Dexa- ordered through GYN.  She will call him.

## 2013-01-28 ENCOUNTER — Other Ambulatory Visit: Payer: Self-pay | Admitting: Family Medicine

## 2013-04-05 ENCOUNTER — Other Ambulatory Visit: Payer: Self-pay | Admitting: Family Medicine

## 2013-06-07 ENCOUNTER — Emergency Department: Payer: Self-pay | Admitting: Emergency Medicine

## 2013-06-07 ENCOUNTER — Telehealth: Payer: Self-pay | Admitting: Family Medicine

## 2013-06-07 LAB — BASIC METABOLIC PANEL
Anion Gap: 5 — ABNORMAL LOW (ref 7–16)
Calcium, Total: 9.1 mg/dL (ref 8.5–10.1)
Creatinine: 0.77 mg/dL (ref 0.60–1.30)
EGFR (African American): >60
Glucose: 96 mg/dL (ref 65–99)
Potassium: 4.2 mmol/L (ref 3.5–5.1)

## 2013-06-07 LAB — CBC
HGB: 13.4 g/dL (ref 12.0–16.0)
MCH: 31.8 pg (ref 26.0–34.0)
MCV: 90 fL (ref 80–100)
Platelet: 179 10*3/uL (ref 150–440)
RBC: 4.21 10*6/uL (ref 3.80–5.20)
RDW: 12.4 % (ref 11.5–14.5)
WBC: 7.5 10*3/uL (ref 3.6–11.0)

## 2013-06-07 NOTE — Telephone Encounter (Signed)
Patient Information:  Caller Name: Leanna  Phone: [phone]  Patient: Leah Melendez, Leah Melendez  Gender: Female  DOB: 1946/02/07  Age: 67 Years  PCP: [facility])  Office Follow Up:  Does the office need to follow up with this patient?: No  Instructions For The Office: N/A  RN Note:  Chest pain present now when sitting still; pain sometime worse when moves or walks. Declined to call 911 now; discussed risk for delayed treatment. Agreed to go to ED now.  Symptoms  Reason For Call & Symptoms: Intermittent left upper back "tightness" or soreness" that radiates to left chest and auxilla. Mild nausea present.    No rash present. Seen in UC 05/16/13; diagnosed with muscle spasms. UC MD was concerned about long term use of Simvstatin with sudden onset of symptoms.  Reviewed Health History In EMR: Yes  Reviewed Medications In EMR: Yes  Reviewed Allergies In EMR: Yes  Reviewed Surgeries / Procedures: Yes  Date of Onset of Symptoms: 05/15/2013  Treatments Tried: Aleve. Skelaxin, Prednisone 20 mg BID X 5 days  Treatments Tried Worked: No  Guideline(s) Used:  Chest Pain  Disposition Per Guideline:   Call EMS 911 Now  Reason For Disposition Reached:   Chest pain lasting longer than 5 minutes and ANY of the following:  Over 62 years old Over 58 years old and at least one cardiac risk factor (i.e., high blood pressure, diabetes, high cholesterol, obesity, smoker or strong family history of heart disease) Pain is crushing, pressure-like, or heavy  Took nitroglycerin and chest pain was not relieved History of heart disease (i.e., angina, heart attack, bypass surgery, angioplasty, CHF)  Advice Given:  N/A  Patient Refused Recommendation:  Patient Will Go To ED  Will ask husband to drive to ED now

## 2013-06-07 NOTE — Telephone Encounter (Signed)
Please check on her tomorrow 

## 2013-06-08 NOTE — Telephone Encounter (Signed)
Spoke with patient.  She did go to [facility] ER yesterday, had several tests done and doctor there decided that symptoms are musculoskeletal, but they do want her to have a stress test.  I advised patient that she needs to have an ER follow up, has scheduled an appt for 8/28.

## 2013-06-16 ENCOUNTER — Encounter: Payer: Self-pay | Admitting: Family Medicine

## 2013-06-16 ENCOUNTER — Ambulatory Visit (INDEPENDENT_AMBULATORY_CARE_PROVIDER_SITE_OTHER): Payer: Medicare Other | Admitting: Family Medicine

## 2013-06-16 VITALS — BP 120/72 | HR 103 | Temp 97.9°F | Ht 61.0 in | Wt 174.0 lb

## 2013-06-16 DIAGNOSIS — R079 Chest pain, unspecified: Secondary | ICD-10-CM | POA: Insufficient documentation

## 2013-06-16 DIAGNOSIS — R5381 Other malaise: Secondary | ICD-10-CM

## 2013-06-16 NOTE — Patient Instructions (Addendum)
Good to see you. Please stop by to see Shirlee Limerick on your way out to set up your cardiology referral.  We will call you with your lab results.

## 2013-06-16 NOTE — Progress Notes (Signed)
Very pleasant 67 yo female here for ER follow up.  Notes reviewed.    Initially went to urgent care in [place] for back pain and given course of prednisone and skelaxin.  No known injury.  Went to [facility] on 06/16/2013 for 2 weeks of progressive chest pain and back pain, can occur with exertion or at rest.  Started out with thoracic back pain and developed into back and left sided chest pain.  Did have some heartburn- takes omeprazole regularly.  CE, d dimer, CBC, BMET and CXR all unremarkable.  EKG did show some nonspecific changes.  Advised [facility] and advised to follow up with me today.  Remains fatigued, DOE.  Chest pain remains intermittent.  Does have h/o HLD.  Grandparents had h/o CAD. She is non smoker, non diabetic.   HLD- on Simvastatin 40 mg daily.  No complaints, no myalgias. Lab Results  Component Value Date   CHOL 181 01/06/2013   HDL 47.50 01/06/2013   LDLCALC 110* 01/06/2013   TRIG 120.0 01/06/2013   CHOLHDL 4 01/06/2013   Lab Results  Component Value Date   TSH 2.56 09/17/2012    Patient Active Problem List   Diagnosis Date Noted  . Chest pain 06/16/2013  . HIATAL HERNIA 11/30/2008  . OSTEOPENIA 11/30/2008  . GERD 06/01/2008  . HYPERLIPIDEMIA, MIXED 03/19/2007   Past Medical History  Diagnosis Date  . GERD (gastroesophageal reflux disease)   . Hyperlipidemia   . Hemangioma 2010  . Fatty liver 2010  . H. pylori infection    Past Surgical History  Procedure Laterality Date  . Knee surgery  06/29/2007    Meniscus tear, left knee surgery  . Cholecystectomy     History  Substance Use Topics  . Smoking status: Never Smoker   . Smokeless tobacco: Never Used  . Alcohol Use: No   Family History  Problem Relation Age of Onset  . Heart disease Paternal Grandmother   . Heart disease Paternal Grandfather   . Colon cancer Neg Hx    Allergies  Allergen Reactions  . Sulfonamide Derivatives     REACTION: as child   Current Outpatient Prescriptions on  File Prior to Visit  Medication Sig Dispense Refill  . Ascorbic Acid (VITAMIN C) 500 MG tablet Take 500 mg by mouth daily.        . Cholecalciferol ([facility]S HIGH POTENCY VITAMIN D) 1000 UNITS tablet Take 1,000 Units by mouth daily.        . fish oil-omega-3 fatty acids 1000 MG capsule Take one capsule by mouth four times a day       . loratadine (CLARITIN) 10 MG tablet Take 10 mg by mouth daily as needed.       Marland Kitchen omeprazole (PRILOSEC) 40 MG capsule take 1 capsule by mouth once daily  30 capsule  5  . simvastatin (ZOCOR) 40 MG tablet take 1 tablet by mouth once daily  30 tablet  5   No current facility-administered medications on file prior to visit.   The PMH, PSH, Social History, Family History, Medications, and allergies have been reviewed in [facility], and have been updated if relevant.  ROS: See HPI +fatigue +diarrhea  Physical exam: BP 120/72  Pulse 103  Temp(Src) 97.9 F (36.6 C)  Ht 5\' 1"  (1.549 m)  Wt 174 lb (78.926 kg)  BMI 32.89 kg/m2   Wt Readings from Last 3 Encounters:  01/13/13 177 lb (80.287 kg)  09/17/12 175 lb (79.379 kg)  01/20/12 180 lb 12.8  oz (82.01 kg)    General:  alert, well-developed, well-nourished, well-hydrated, and overweight-appearing.  NAD Head:  normocephalic and atraumatic.   Eyes:  vision grossly intact, pupils equal, pupils round, and pupils reactive to light.   Ears:  R ear normal and L ear normal.   Nose:  no external deformity.   Mouth:  good dentition.   Neck:  No deformities, masses, or tenderness noted. Lungs:  Normal respiratory effort, chest expands symmetrically. Lungs are clear to auscultation, no crackles or wheezes. Heart:  Normal rate and regular rhythm. S1 and S2 normal without gallop, murmur, click, rub or other extra sounds. Abdomen:  soft, non-tender, and normal bowel sounds.   Msk:  No deformity or scoliosis noted of thoracic or lumbar spine.   Extremities:  no edema Neurologic:  alert & oriented X3 and cranial nerves II-XII  intact.   Skin:  Intact without suspicious lesions or rashes Psych:  normally interactive and moderately anxious.    Assessment and Plan:  1. Chest pain Intermittent.  Unlikely cardiac but given persistent symptoms, will refer to cardiology for stress testing/further risk stratification.  - Ambulatory referral to Cardiology  2. Other malaise and fatigue Persistent.  May be related to above.  Lab work from ER unremarkable. Will check thyroid panel, H.pylori.   - TSH - T4, Free - H Pylori, IGM, IGG, IGA AB

## 2013-06-18 LAB — H PYLORI, IGM, IGG, IGA AB
H Pylori IgG: <0.9 U/mL (ref 0.0–0.8)
H. pylori, IgA Abs: <9 units (ref 0.0–8.9)

## 2013-06-22 ENCOUNTER — Telehealth: Payer: Self-pay | Admitting: *Deleted

## 2013-06-22 DIAGNOSIS — K219 Gastro-esophageal reflux disease without esophagitis: Secondary | ICD-10-CM

## 2013-06-22 DIAGNOSIS — K449 Diaphragmatic hernia without obstruction or gangrene: Secondary | ICD-10-CM

## 2013-06-22 NOTE — Telephone Encounter (Signed)
Referral placed.

## 2013-06-22 NOTE — Telephone Encounter (Signed)
Pt continues to have GI symptoms and would like referral to specialist, has seen Dr Arlyce Dice before and would like to stay with him.

## 2013-06-23 ENCOUNTER — Other Ambulatory Visit: Payer: Self-pay | Admitting: Family Medicine

## 2013-06-23 DIAGNOSIS — K219 Gastro-esophageal reflux disease without esophagitis: Secondary | ICD-10-CM

## 2013-06-23 DIAGNOSIS — R1013 Epigastric pain: Secondary | ICD-10-CM

## 2013-06-24 ENCOUNTER — Ambulatory Visit: Payer: 59 | Admitting: Family Medicine

## 2013-06-29 ENCOUNTER — Ambulatory Visit: Payer: 59 | Admitting: Family Medicine

## 2013-07-05 ENCOUNTER — Encounter: Payer: Self-pay | Admitting: Cardiovascular Disease

## 2013-07-05 ENCOUNTER — Ambulatory Visit (INDEPENDENT_AMBULATORY_CARE_PROVIDER_SITE_OTHER): Payer: Medicare Other | Admitting: Cardiovascular Disease

## 2013-07-05 VITALS — BP 130/80 | HR 80 | Ht 61.0 in | Wt 176.5 lb

## 2013-07-05 VITALS — BP 130/80 | HR 80 | Ht 61.0 in | Wt 176.0 lb

## 2013-07-05 DIAGNOSIS — R0789 Other chest pain: Secondary | ICD-10-CM

## 2013-07-05 DIAGNOSIS — K449 Diaphragmatic hernia without obstruction or gangrene: Secondary | ICD-10-CM

## 2013-07-05 DIAGNOSIS — R079 Chest pain, unspecified: Secondary | ICD-10-CM

## 2013-07-05 DIAGNOSIS — E782 Mixed hyperlipidemia: Secondary | ICD-10-CM

## 2013-07-05 NOTE — Procedures (Signed)
Exercise Treadmill Test Treadmill ordered for recent episodes of chest pain.  Resting EKG shows NSR with rate of 90 bpm, no significant ST or T wave changes Poor R-wave progression to the anterior precordial leads Resting blood pressure of 140/90 Stand bruce protocal was used.  Patient exercised for Peak heart rate of 164 bpm.  This was 108% of the maximum predicted heart rate (target heart rate 129). Achieved 10.1 METS No symptoms of chest pain or lightheadedness were reported at peak stress or in recovery.  Peak Blood pressure recorded was 160/82. Heart rate at 3 minutes in recovery was 117 bpm. No ST changes concerning for ischemia.  FINAL IMPRESSION: Normal exercise stress test. No significant EKG changes concerning for ischemia. Good exercise tolerance.

## 2013-07-05 NOTE — Assessment & Plan Note (Signed)
Encouraged her to stay on her simvastatin. Back pain has improved

## 2013-07-05 NOTE — Assessment & Plan Note (Signed)
Uncertain if her GERD or hiatal hernia is contributing to her chest pain symptoms. Suggested she have followup with GI/

## 2013-07-05 NOTE — Assessment & Plan Note (Signed)
Etiology of her chest pain is uncertain. Unable to exclude angina as a cause of her symptoms. We have ordered a routine treadmill study. She is concerned about GI etiology. Suggested she could try to double her PPI and discuss this with her previously seen GI physician.

## 2013-07-05 NOTE — Patient Instructions (Addendum)
You are doing well. No medication changes were made.  Stress test was normal No further testing is needed at this time  Please call us if you have new issues that need to be addressed before your next appt.

## 2013-07-05 NOTE — Progress Notes (Signed)
Patient ID: Leah Melendez, female    DOB: 06-14-1946, 67 y.o.   MRN: [id number]  HPI Comments: Leah Melendez is a very pleasant 67 year old woman with history of GI issues including GERD, hiatal hernia, mild obesity who presents with fatigue, shortness of breath, chest pain..  She reports developing significant back pain 05/16/2013. She had malaise. Symptoms waxed and waned for several weeks. She was tried on muscle relaxers and prednisone. Since then she has had a tightness in the left part of her chest, some shortness of breath with exertion. She's feeling more tired. Occasionally has ankle swelling. She does not exercise on a regular basis. She used to exercise in the past but not recently as she has been dizzy. Denies having any worsening chest pain with exertion such as shopping or cleaning house or gardening.  She went to the hospital 06/07/2013 for chest pain symptoms.  Hospital records were reviewed . EKG was normal, cardiac enzymes negative x2, EKG with poor R-wave progression through the anterior precordial leads otherwise normal lab work  EKG shows normal sinus rhythm with rate 80 beats per minute with no significant ST or T wave changes   Outpatient Encounter Prescriptions as of 07/05/2013  Medication Sig Dispense Refill  . Ascorbic Acid (VITAMIN C) 500 MG tablet Take 500 mg by mouth daily.        . Cholecalciferol ([employer]S HIGH POTENCY VITAMIN D) 1000 UNITS tablet Take 1,000 Units by mouth daily.        . fish oil-omega-3 fatty acids 1000 MG capsule Take one capsule by mouth four times a day       . loratadine (CLARITIN) 10 MG tablet Take 10 mg by mouth daily as needed.       Marland Kitchen omeprazole (PRILOSEC) 40 MG capsule take 1 capsule by mouth once daily  30 capsule  5  . simvastatin (ZOCOR) 40 MG tablet take 1 tablet by mouth once daily  30 tablet  5   No facility-administered encounter medications on file as of 07/05/2013.     Review of Systems  Constitutional: Negative.   HENT:  Negative.   Eyes: Negative.   Respiratory: Positive for chest tightness and shortness of breath.   Cardiovascular: Negative.   Gastrointestinal: Negative.   Musculoskeletal: Negative.   Skin: Negative.   Neurological: Negative.   Psychiatric/Behavioral: Negative.   All other systems reviewed and are negative.    BP 130/80  Pulse 80  Ht 5\' 1"  (1.549 m)  Wt 176 lb 8 oz (80.06 kg)  BMI 33.37 kg/m2   Physical Exam  Nursing note and vitals reviewed. Constitutional: She is oriented to person, place, and time. She appears well-developed and well-nourished.  HENT:  Head: Normocephalic.  Nose: Nose normal.  Mouth/Throat: Oropharynx is clear and moist.  Eyes: Conjunctivae are normal. Pupils are equal, round, and reactive to light.  Neck: Normal range of motion. Neck supple. No JVD present.  Cardiovascular: Normal rate, regular rhythm, S1 normal, S2 normal, normal heart sounds and intact distal pulses.  Exam reveals no gallop and no friction rub.   No murmur heard. Pulmonary/Chest: Effort normal and breath sounds normal. No respiratory distress. She has no wheezes. She has no rales. She exhibits no tenderness.  Abdominal: Soft. Bowel sounds are normal. She exhibits no distension. There is no tenderness.  Musculoskeletal: Normal range of motion. She exhibits no edema and no tenderness.  Lymphadenopathy:    She has no cervical adenopathy.  Neurological: She is alert and oriented  to person, place, and time. Coordination normal.  Skin: Skin is warm and dry. No rash noted. No erythema.  Psychiatric: She has a normal mood and affect. Her behavior is normal. Judgment and thought content normal.    Assessment and Plan

## 2013-07-05 NOTE — Patient Instructions (Addendum)
You are doing well. No medication changes were made.  We will schedule you for a stress test today for chest pain  Please call us if you have new issues that need to be addressed before your next appt.

## 2013-07-15 ENCOUNTER — Other Ambulatory Visit: Payer: Self-pay | Admitting: Family Medicine

## 2013-07-16 ENCOUNTER — Encounter: Payer: Self-pay | Admitting: Family Medicine

## 2013-07-20 ENCOUNTER — Ambulatory Visit (INDEPENDENT_AMBULATORY_CARE_PROVIDER_SITE_OTHER): Payer: Medicare Other | Admitting: Gastroenterology

## 2013-07-20 ENCOUNTER — Encounter: Payer: Self-pay | Admitting: Gastroenterology

## 2013-07-20 VITALS — BP 160/100 | HR 80 | Ht 61.42 in | Wt 177.1 lb

## 2013-07-20 DIAGNOSIS — R1011 Right upper quadrant pain: Secondary | ICD-10-CM

## 2013-07-20 DIAGNOSIS — K219 Gastro-esophageal reflux disease without esophagitis: Secondary | ICD-10-CM

## 2013-07-20 MED ORDER — SULINDAC 200 MG PO TABS: ORAL_TABLET | ORAL | Status: DC

## 2013-07-20 MED ORDER — DEXLANSOPRAZOLE 60 MG PO CPDR: 60.0000 mg | DELAYED_RELEASE_CAPSULE | Freq: Every day | ORAL | Status: DC

## 2013-07-20 NOTE — Progress Notes (Signed)
History of Present Illness: The patient has returned for evaluation of right upper quadrant pain.  She's had several weeks of pain on her right side radiating around to her side.  Pain occurs spontaneously but is worsened with movement.  It is unrelated to eating.  Despite daily omeprazole she's having frequent pyrosis.  She complains of sore throat.  She denies dysphagia.  She is on no gastric irritants. Upper endoscopy in August, 2012 was normal.  Colonoscopy in 2012 was also normal.  Random biopsies were normal.      Review of Systems: Pertinent positive and negative review of systems were noted in the above HPI section. All other review of systems were otherwise negative.    Current Medications, Allergies, Past Medical History, Past Surgical History, Family History and Social History were reviewed in [facility] electronic medical record  Vital signs were reviewed in today's medical record. Physical Exam: General: Well developed , well nourished, no acute distress Skin: anicteric Head: Normocephalic and atraumatic Eyes:  sclerae anicteric, EOMI Ears: Normal auditory acuity Mouth: No deformity or lesions Lungs: Clear throughout to auscultation Heart: Regular rate and rhythm; no murmurs, rubs or bruits Abdomen: Soft, non tender and non distended. No masses, hepatosplenomegaly or hernias noted. Normal Bowel sounds Rectal:deferred Musculoskeletal: Symmetrical with no gross deformities . There is tenderness over the right ninth and 10th ribs that reproduces her pain Pulses:  Normal pulses noted Extremities: No clubbing, cyanosis, edema or deformities noted Neurological: Alert oriented x 4, grossly nonfocal Psychological:  Alert and cooperative. Normal mood and affect

## 2013-07-20 NOTE — Patient Instructions (Addendum)
We are giving you samples of Dexilant  Follow up in one month Apply warm heat to affected area

## 2013-07-20 NOTE — Assessment & Plan Note (Signed)
Several week history of right upper quadrant pain and exam demonstrating tenderness over the right ribs.  Still suspect she is suffering from musculoskeletal pain.  Recommendations #1  Clinoril 200 mg twice a day for 2 weeks then as needed

## 2013-07-20 NOTE — Assessment & Plan Note (Signed)
Patient is symptomatic despite daily omeprazole.  Recommendations #1 trial of dexilant 60 mg every morning

## 2013-07-30 ENCOUNTER — Telehealth: Payer: Self-pay | Admitting: Gastroenterology

## 2013-07-30 NOTE — Telephone Encounter (Signed)
I agree

## 2013-07-30 NOTE — Telephone Encounter (Signed)
Pt states she has had diarrhea since she started taking Dexilant. Instructed pt to stop the Dexilant and see if the diarrhea stops. Pt will start her omeprazole that she was taking before tomorrow. Pt is to let us know how this works.

## 2013-08-19 ENCOUNTER — Ambulatory Visit (INDEPENDENT_AMBULATORY_CARE_PROVIDER_SITE_OTHER): Payer: Medicare Other | Admitting: Gastroenterology

## 2013-08-19 ENCOUNTER — Encounter: Payer: Self-pay | Admitting: Gastroenterology

## 2013-08-19 VITALS — BP 110/70 | HR 60 | Ht 61.0 in | Wt 176.0 lb

## 2013-08-19 DIAGNOSIS — R1011 Right upper quadrant pain: Secondary | ICD-10-CM

## 2013-08-19 DIAGNOSIS — K219 Gastro-esophageal reflux disease without esophagitis: Secondary | ICD-10-CM

## 2013-08-19 MED ORDER — OMEPRAZOLE-SODIUM BICARBONATE 40-1100 MG PO CAPS: ORAL_CAPSULE | ORAL | Status: DC

## 2013-08-19 NOTE — Assessment & Plan Note (Signed)
Patient primarily has nocturnal GERD.  She's intolerant of dexilant  Recommendations #1 Zegerid 40 mg each bedtime; if she has breakthrough symptoms during the day she can add a second dose in the morning

## 2013-08-19 NOTE — Assessment & Plan Note (Signed)
Symptoms are improved with Clinoril.  She will take as needed.

## 2013-08-19 NOTE — Progress Notes (Signed)
History of Present Illness:  The patient has returned for followup of her reflux.  She continues to have sore throat and hoarseness and breakthrough pyrosis with regurgitation, particularly at night.  Dexilant caused diarrhea.  She's taking omeprazole in the mornings.  Right upper quadrant pain is clearly improved with Clinoril.    Review of Systems: Pertinent positive and negative review of systems were noted in the above HPI section. All other review of systems were otherwise negative.    Current Medications, Allergies, Past Medical History, Past Surgical History, Family History and Social History were reviewed in [employer] electronic medical record  Vital signs were reviewed in today's medical record. Physical Exam: General: Well developed , well nourished, no acute distress

## 2013-08-19 NOTE — Patient Instructions (Signed)
Zegerid Samples Given Take one every night at bedtime if breakthrough symptoms occur take a second dose

## 2013-08-27 ENCOUNTER — Telehealth: Payer: Self-pay | Admitting: Gastroenterology

## 2013-08-27 NOTE — Telephone Encounter (Signed)
Left message for patient thst if Zegerid is not helping to discontinue it. Call the office and let me know what she has tried

## 2013-08-30 ENCOUNTER — Telehealth: Payer: Self-pay | Admitting: *Deleted

## 2013-08-30 NOTE — Telephone Encounter (Signed)
Patient has severe nausea today Abdominal pain RUQ pain  Patient very nauseous while having bowel movement

## 2013-08-30 NOTE — Telephone Encounter (Signed)
  Pt states that she is out of Zegerid but she does not think it is helping. State she is still having right upper quadrant pain and nausea. States she had a BM today and got nauseated and threw up in the trash can. She took dexilant and it gave her diarrhea immediately. Pt wants to know what to do next. Please advise.

## 2013-08-31 NOTE — Telephone Encounter (Signed)
Begin protonix 40mg  bid; d/c dexilant Is she still having RUQ pain?

## 2013-08-31 NOTE — Telephone Encounter (Signed)
Spoke with pt and she states she started taking the zegerid again and seems to feel better today. She still has the pain but was not taking the clinoril. Instructed pt to start the clinoril and to call back if no better. If the zegerid does not help we can then call in the protonix. Pt verbalized understanding.

## 2013-09-02 ENCOUNTER — Other Ambulatory Visit: Payer: Self-pay

## 2013-09-25 ENCOUNTER — Other Ambulatory Visit: Payer: Self-pay | Admitting: Family Medicine

## 2013-11-08 ENCOUNTER — Encounter: Payer: Self-pay | Admitting: Family Medicine

## 2013-11-08 ENCOUNTER — Ambulatory Visit (INDEPENDENT_AMBULATORY_CARE_PROVIDER_SITE_OTHER): Payer: Medicare Other | Admitting: Family Medicine

## 2013-11-08 VITALS — BP 132/82 | HR 72 | Temp 97.3°F | Ht 61.0 in | Wt 178.2 lb

## 2013-11-08 DIAGNOSIS — M65342 Trigger finger, left ring finger: Secondary | ICD-10-CM

## 2013-11-08 DIAGNOSIS — M6530 Trigger finger, unspecified finger: Secondary | ICD-10-CM

## 2013-11-08 NOTE — Progress Notes (Signed)
Classic L 4th Trigger finger.  Trigger Finger Injection, L 4th Verbal consent was obtained. Risks (including rare risk of infection, potential risk for skin lightening and potential atrophy), benefits and alternatives were discussed. Prepped with Chloraprep and Ethyl Chloride used for anesthesia. Under sterile conditions, patient injected at palmar crease aiming distally with 45 degree angle towards nodule; injected directly into tendon sheath. Medication flowed freely without resistance.  Needle size: 22 gauge 1 1/2 inch Injection: 1/2 cc of Lidocaine 1% and 1/2 cc of Depo-Medrol 40 mg   We discussed the pathophysiology of trigger fingers. Discussed the inflammatory nature of nodule creation and likely nodule abutting the A1 pulley system, this causing the patient's discomfort and sensations. We discussed that treatments for this include direct injection into the tendon sheath to attempt to shrink catching tissue. This can be done 1-2 times. Other treatments include surgical release. If the patient fails to trigger finger injections, I would recommend trigger finger release if the patient desires relief of the symptoms.

## 2013-11-08 NOTE — Progress Notes (Signed)
Pre-visit discussion using our clinic review tool. No additional management support is needed unless otherwise documented below in the visit note.  

## 2013-12-24 ENCOUNTER — Telehealth: Payer: Self-pay | Admitting: *Deleted

## 2013-12-24 NOTE — Telephone Encounter (Signed)
Fax request from pharmacy   Refilled pts prescription

## 2014-01-20 ENCOUNTER — Other Ambulatory Visit: Payer: Self-pay | Admitting: Family Medicine

## 2014-02-16 ENCOUNTER — Encounter: Payer: Self-pay | Admitting: Family Medicine

## 2014-02-16 ENCOUNTER — Ambulatory Visit (INDEPENDENT_AMBULATORY_CARE_PROVIDER_SITE_OTHER): Payer: Medicare Other | Admitting: Family Medicine

## 2014-02-16 VITALS — BP 126/82 | HR 91 | Temp 97.8°F | Ht 61.0 in | Wt 176.0 lb

## 2014-02-16 DIAGNOSIS — E782 Mixed hyperlipidemia: Secondary | ICD-10-CM

## 2014-02-16 DIAGNOSIS — K219 Gastro-esophageal reflux disease without esophagitis: Secondary | ICD-10-CM

## 2014-02-16 LAB — COMPREHENSIVE METABOLIC PANEL
ALK PHOS: 66 U/L (ref 39–117)
ALT: 16 U/L (ref 0–35)
AST: 17 U/L (ref 0–37)
Albumin: 3.8 g/dL (ref 3.5–5.2)
BILIRUBIN TOTAL: 1.1 mg/dL (ref 0.3–1.2)
BUN: 13 mg/dL (ref 6–23)
CO2: 27 mEq/L (ref 19–32)
CREATININE: 0.7 mg/dL (ref 0.4–1.2)
Calcium: 9.4 mg/dL (ref 8.4–10.5)
Chloride: 104 mEq/L (ref 96–112)
GFR: 89.55 mL/min (ref >60.00)
Glucose, Bld: 81 mg/dL (ref 70–99)
Potassium: 4.4 mEq/L (ref 3.5–5.1)
SODIUM: 138 meq/L (ref 135–145)
TOTAL PROTEIN: 7.3 g/dL (ref 6.0–8.3)

## 2014-02-16 LAB — CBC WITH DIFFERENTIAL/PLATELET
BASOS ABS: 0 10*3/uL (ref 0.0–0.1)
BASOS PCT: 0.8 % (ref 0.0–3.0)
EOS PCT: 2.1 % (ref 0.0–5.0)
Eosinophils Absolute: 0.1 10*3/uL (ref 0.0–0.7)
HEMATOCRIT: 40.5 % (ref 36.0–46.0)
HEMOGLOBIN: 13.7 g/dL (ref 12.0–15.0)
LYMPHS ABS: 1.9 10*3/uL (ref 0.7–4.0)
LYMPHS PCT: 32.4 % (ref 12.0–46.0)
MCHC: 33.8 g/dL (ref 30.0–36.0)
MCV: 93.2 fl (ref 78.0–100.0)
MONOS PCT: 7.3 % (ref 3.0–12.0)
Monocytes Absolute: 0.4 10*3/uL (ref 0.1–1.0)
NEUTROS ABS: 3.3 10*3/uL (ref 1.4–7.7)
Neutrophils Relative %: 57.4 % (ref 43.0–77.0)
Platelets: 183 10*3/uL (ref 150.0–400.0)
RBC: 4.34 Mil/uL (ref 3.87–5.11)
RDW: 12.6 % (ref 11.5–14.6)
WBC: 5.7 10*3/uL (ref 4.5–10.5)

## 2014-02-16 LAB — LIPID PANEL
CHOLESTEROL: 179 mg/dL (ref 0–200)
HDL: 48.5 mg/dL (ref >39.00)
LDL CALC: 103 mg/dL — AB (ref 0–99)
Total CHOL/HDL Ratio: 4
Triglycerides: 139 mg/dL (ref 0.0–149.0)
VLDL: 27.8 mg/dL (ref 0.0–40.0)

## 2014-02-16 MED ORDER — SIMVASTATIN 40 MG PO TABS
ORAL_TABLET | ORAL | Status: DC
Start: 2014-02-16 — End: 2014-07-14

## 2014-02-16 NOTE — Assessment & Plan Note (Signed)
Continue current dose of statin. Check labs today. Orders Placed This Encounter  Procedures  . CBC with Differential  . Comprehensive metabolic panel  . Lipid panel

## 2014-02-16 NOTE — Patient Instructions (Signed)
Good to see you. We will call you with your lab results.   

## 2014-02-16 NOTE — Assessment & Plan Note (Signed)
Much improved on current rx. Followed by GI.

## 2014-02-16 NOTE — Progress Notes (Signed)
Very pleasant 68 yo female here for med refills.  Has not been seen for routine care since 12/2012.   Doing well. She is really enjoying her retirement.  Travels with church friends and babysits her grand children.   HLD- on Simvastatin 40 mg daily.  No complaints, no myalgias. Lab Results  Component Value Date   CHOL 181 01/06/2013   HDL 47.50 01/06/2013   LDLCALC 110* 01/06/2013   TRIG 120.0 01/06/2013   CHOLHDL 4 01/06/2013    GERD- well controlled with Bicarb and Omeprazole at bedtime.  Symptoms much, much better.   Patient Active Problem List   Diagnosis Date Noted  . HIATAL HERNIA 11/30/2008  . OSTEOPENIA 11/30/2008  . GERD 06/01/2008  . HYPERLIPIDEMIA, MIXED 03/19/2007   Past Medical History  Diagnosis Date  . GERD (gastroesophageal reflux disease)   . Hyperlipidemia   . Hemangioma 2010  . Fatty liver 2010  . H. pylori infection   . Hiatal hernia    Past Surgical History  Procedure Laterality Date  . Knee surgery  06/29/2007    Meniscus tear, left knee surgery  . Cholecystectomy     History  Substance Use Topics  . Smoking status: Never Smoker   . Smokeless tobacco: Never Used  . Alcohol Use: No   Family History  Problem Relation Age of Onset  . Colon cancer Neg Hx    Allergies  Allergen Reactions  . Dexilant [Dexlansoprazole] Diarrhea  . Sulfonamide Derivatives     REACTION: as child   Current Outpatient Prescriptions on File Prior to Visit  Medication Sig Dispense Refill  . Ascorbic Acid (VITAMIN C) 500 MG tablet Take 500 mg by mouth daily.        . Cholecalciferol ([facility] HIGH POTENCY VITAMIN D) 1000 UNITS tablet Take 1,000 Units by mouth daily.        . fish oil-omega-3 fatty acids 1000 MG capsule Take one capsule by mouth four times a day       . loratadine (CLARITIN) 10 MG tablet Take 10 mg by mouth daily as needed.       Marland Kitchen omeprazole-sodium bicarbonate (ZEGERID) 40-1100 MG per capsule Take one tab each bedtime  30 capsule  3  . sulindac (CLINORIL)  200 MG tablet Take one tab twice a day for 2 weeks then as needed  60 tablet  2   No current facility-administered medications on file prior to visit.   The PMH, PSH, Social History, Family History, Medications, and allergies have been reviewed in [facility], and have been updated if relevant.  ROS: See HPI  Physical exam: BP 126/82  Pulse 91  Temp(Src) 97.8 F (36.6 C) (Oral)  Ht 5\' 1"  (1.549 m)  Wt 176 lb (79.833 kg)  BMI 33.27 kg/m2  SpO2 94%  Wt Readings from Last 3 Encounters:  02/16/14 176 lb (79.833 kg)  11/08/13 178 lb 4 oz (80.854 kg)  08/19/13 176 lb (79.833 kg)    General:  alert, well-developed, well-nourished, well-hydrated, and overweight-appearing.  NAD Head:  normocephalic and atraumatic.   Eyes:  vision grossly intact, pupils equal, pupils round, and pupils reactive to light.   Ears:  R ear normal and L ear normal.   Nose:  no external deformity.   Mouth:  good dentition.   Neck:  No deformities, masses, or tenderness noted. Lungs:  Normal respiratory effort, chest expands symmetrically. Lungs are clear to auscultation, no crackles or wheezes. Heart:  Normal rate and regular rhythm. S1 and S2  normal without gallop, murmur, click, rub or other extra sounds. Abdomen:  soft, non-tender, and normal bowel sounds.   Msk:  No deformity or scoliosis noted of thoracic or lumbar spine.   Extremities:  no edema Neurologic:  alert & oriented X3 and cranial nerves II-XII intact.   Skin:  Intact without suspicious lesions or rashes Psych:  normally interactive and moderately anxious.    Assessment and Plan:

## 2014-02-16 NOTE — Progress Notes (Signed)
Pre visit review using our clinic review tool, if applicable. No additional management support is needed unless otherwise documented below in the visit note. 

## 2014-02-17 ENCOUNTER — Encounter: Payer: Self-pay | Admitting: Family Medicine

## 2014-04-12 LAB — HM MAMMOGRAPHY

## 2014-07-07 ENCOUNTER — Other Ambulatory Visit: Payer: Medicare Other

## 2014-07-14 ENCOUNTER — Encounter: Payer: Self-pay | Admitting: Family Medicine

## 2014-07-14 ENCOUNTER — Ambulatory Visit (INDEPENDENT_AMBULATORY_CARE_PROVIDER_SITE_OTHER): Payer: Medicare Other | Admitting: Family Medicine

## 2014-07-14 VITALS — BP 128/72 | HR 89 | Temp 97.9°F | Ht 61.0 in | Wt 176.0 lb

## 2014-07-14 DIAGNOSIS — M949 Disorder of cartilage, unspecified: Secondary | ICD-10-CM

## 2014-07-14 DIAGNOSIS — Z23 Encounter for immunization: Secondary | ICD-10-CM

## 2014-07-14 DIAGNOSIS — K219 Gastro-esophageal reflux disease without esophagitis: Secondary | ICD-10-CM

## 2014-07-14 DIAGNOSIS — E782 Mixed hyperlipidemia: Secondary | ICD-10-CM

## 2014-07-14 DIAGNOSIS — Z0000 Encounter for general adult medical examination without abnormal findings: Secondary | ICD-10-CM | POA: Insufficient documentation

## 2014-07-14 DIAGNOSIS — M899 Disorder of bone, unspecified: Secondary | ICD-10-CM

## 2014-07-14 MED ORDER — OMEPRAZOLE-SODIUM BICARBONATE 40-1100 MG PO CAPS: ORAL_CAPSULE | ORAL | Status: DC

## 2014-07-14 MED ORDER — SIMVASTATIN 40 MG PO TABS: ORAL_TABLET | ORAL | Status: DC

## 2014-07-14 MED ORDER — RANITIDINE HCL 150 MG PO TABS: 150.0000 mg | ORAL_TABLET | Freq: Two times a day (BID) | ORAL | Status: DC

## 2014-07-14 NOTE — Progress Notes (Signed)
Pre visit review using our clinic review tool, if applicable. No additional management support is needed unless otherwise documented below in the visit note. 

## 2014-07-14 NOTE — Assessment & Plan Note (Signed)
Deteriorated. Trial of Xantac twice daily added to zegerid. She will call me in 2 weeks with an update.

## 2014-07-14 NOTE — Assessment & Plan Note (Signed)
The patients weight, height, BMI and visual acuity have been recorded in the chart I have made referrals, counseling and provided education to the patient based review of the above and I have provided the pt with a written personalized care plan for preventive services.  Influenza vaccination today.

## 2014-07-14 NOTE — Patient Instructions (Signed)
Good to see you. We are adding Xantac twice daily- call me in 2 weeks with an update.

## 2014-07-14 NOTE — Assessment & Plan Note (Signed)
Well controlled on Zocor 40 mg daily. No changes made today.

## 2014-07-14 NOTE — Progress Notes (Signed)
Very pleasant 68 yo female here for annual medicare wellness visit.  I have personally reviewed the Medicare Annual Wellness questionnaire and have noted 1. The patient's medical and social history 2. Their use of alcohol, tobacco or illicit drugs 3. Their current medications and supplements 4. The patient's functional ability including ADL's, fall risks, home safety risks and hearing or visual             impairment. 5. Diet and physical activities 6. Evidence for depression or mood disorders  End of life wishes discussed and updated in Social History.  The roster of all physicians providing medical care to patient - is listed in the Snapshot section of the chart.  Td 11/30/08 Zoster 11/30/08 Pneumovax 01/13/13 Prevnar 13- 04/05/14  Colonoscopy - Deatra Ina- 07/25/11- 10 year recall Mammogram 04/12/2014 Bone density 04/07/13  Lab Results  Component Value Date   CHOL 179 02/16/2014   HDL 48.50 02/16/2014   LDLCALC 103* 02/16/2014   TRIG 139.0 02/16/2014   CHOLHDL 4 02/16/2014   Lab Results  Component Value Date   CREATININE 0.7 02/16/2014   Lab Results  Component Value Date   TSH 2.27 06/16/2013   Lab Results  Component Value Date   WBC 5.7 02/16/2014   HGB 13.7 02/16/2014   HCT 40.5 02/16/2014   MCV 93.2 02/16/2014   PLT 183.0 02/16/2014     HLD- on Simvastatin 40 mg daily.  No complaints, no myalgias. Lab Results  Component Value Date   CHOL 179 02/16/2014   HDL 48.50 02/16/2014   LDLCALC 103* 02/16/2014   TRIG 139.0 02/16/2014   CHOLHDL 4 02/16/2014    GERD- was well controlled with Zegerid but last few weeks, symptoms much worse. +epigastric burning, nausea, no vomiting. No changes in bowel habits or blood in her stool No fevers. Remote h/o cholecystectomy.  Has OBGYN, Dr. Matthew Saras.  Osteopenia- per pt, recent dexa scan was stable- osteopenia  Patient Active Problem List   Diagnosis Date Noted  . Medicare annual wellness visit, subsequent 07/14/2014  . HIATAL HERNIA  11/30/2008  . OSTEOPENIA 11/30/2008  . GERD 06/01/2008  . HYPERLIPIDEMIA, MIXED 03/19/2007   Past Medical History  Diagnosis Date  . GERD (gastroesophageal reflux disease)   . Hyperlipidemia   . Hemangioma 2010  . Fatty liver 2010  . H. pylori infection   . Hiatal hernia    Past Surgical History  Procedure Laterality Date  . Knee surgery  06/29/2007    Meniscus tear, left knee surgery  . Cholecystectomy     History  Substance Use Topics  . Smoking status: Never Smoker   . Smokeless tobacco: Never Used  . Alcohol Use: No   Family History  Problem Relation Age of Onset  . Colon cancer Neg Hx    Allergies  Allergen Reactions  . Dexilant [Dexlansoprazole] Diarrhea  . Sulfonamide Derivatives     REACTION: as child   Current Outpatient Prescriptions on File Prior to Visit  Medication Sig Dispense Refill  . Ascorbic Acid (VITAMIN C) 500 MG tablet Take 500 mg by mouth daily.        . Cholecalciferol ([facility]S HIGH POTENCY VITAMIN D) 1000 UNITS tablet Take 1,000 Units by mouth daily.        . fish oil-omega-3 fatty acids 1000 MG capsule Take one capsule by mouth four times a day       . loratadine (CLARITIN) 10 MG tablet Take 10 mg by mouth daily as needed.       Marland Kitchen  omeprazole-sodium bicarbonate (ZEGERID) 40-1100 MG per capsule Take one tab each bedtime  30 capsule  3  . sulindac (CLINORIL) 200 MG tablet Take one tab twice a day for 2 weeks then as needed  60 tablet  2   No current facility-administered medications on file prior to visit.   The PMH, PSH, Social History, Family History, Medications, and allergies have been reviewed in [facility], and have been updated if relevant.  ROS: See HPI Patient reports no  vision/ hearing changes,anorexia, weight change, fever ,adenopathy, persistant / recurrent hoarseness, swallowing issues, chest pain, edema,persistant / recurrent cough, hemoptysis, dyspnea(rest, exertional, paroxysmal nocturnal), gastrointestinal  bleeding (melena, rectal  bleeding), abdominal pain, excessive heart burn, GU symptoms(dysuria, hematuria, pyuria, voiding/incontinence  Issues) syncope, focal weakness, severe memory loss, concerning skin lesions, depression, anxiety, abnormal bruising/bleeding, major joint swelling, breast masses or abnormal vaginal bleeding.    Physical exam: BP 128/72  Pulse 89  Temp(Src) 97.9 F (36.6 C) (Oral)  Ht 5\' 1"  (1.549 m)  Wt 176 lb (79.833 kg)  BMI 33.27 kg/m2  SpO2 97%  Wt Readings from Last 3 Encounters:  07/14/14 176 lb (79.833 kg)  02/16/14 176 lb (79.833 kg)  11/08/13 178 lb 4 oz (80.854 kg)    General:  alert, well-developed, well-nourished, well-hydrated, and overweight-appearing.  NAD Head:  normocephalic and atraumatic.   Eyes:  vision grossly intact, pupils equal, pupils round, and pupils reactive to light.   Ears:  R ear normal and L ear normal.   Nose:  no external deformity.   Mouth:  good dentition.   Neck:  No deformities, masses, or tenderness noted. Lungs:  Normal respiratory effort, chest expands symmetrically. Lungs are clear to auscultation, no crackles or wheezes. Heart:  Normal rate and regular rhythm. S1 and S2 normal without gallop, murmur, click, rub or other extra sounds. Abdomen:  soft, non-tender, and normal bowel sounds.   Msk:  No deformity or scoliosis noted of thoracic or lumbar spine.   Extremities:  no edema Neurologic:  alert & oriented X3 and cranial nerves II-XII intact.   Skin:  Intact without suspicious lesions or rashes Psych:  normally interactive and moderately anxious.

## 2014-07-20 ENCOUNTER — Ambulatory Visit (INDEPENDENT_AMBULATORY_CARE_PROVIDER_SITE_OTHER): Payer: Medicare Other | Admitting: Family Medicine

## 2014-07-20 ENCOUNTER — Encounter: Payer: Self-pay | Admitting: Family Medicine

## 2014-07-20 VITALS — BP 140/76 | HR 73 | Temp 98.0°F | Ht 61.0 in | Wt 179.2 lb

## 2014-07-20 DIAGNOSIS — M6530 Trigger finger, unspecified finger: Secondary | ICD-10-CM

## 2014-07-20 DIAGNOSIS — M19049 Primary osteoarthritis, unspecified hand: Secondary | ICD-10-CM

## 2014-07-20 DIAGNOSIS — M180 Bilateral primary osteoarthritis of first carpometacarpal joints: Secondary | ICD-10-CM

## 2014-07-20 NOTE — Progress Notes (Signed)
Dr. Frederico Hamman T. Marrian Bells, MD, [facility] [place], [address] Phone: [phone] Fax: [phone]  07/20/2014  Patient: Leah Melendez, MRN: [id number], DOB: Feb 25, 1946, 68 y.o.  Primary Physician:  Arnette Norris, MD  Chief Complaint: Finger Injury  Subjective:   Leah Melendez is a 68 y.o. very pleasant female patient who presents with the following:  The patient well. I injected her trigger finger on the patient about 9 months ago on her 4th digit on the LEFT. She presents today with some generalized pain in her EIP, DIP joint, and MCP joint as well as her [place] joint on the 1st digit. This is been present for months to years, and it will intermittently flareup in the back, and other times it'll be not that bad. She does also have some mild triggering on her 3rd and 4th on the LEFT hand, but is not very painful right now. Notably, this is better than when I injected her 4th trigger finger about 9 months ago. Currently it does not really hurt that much, and we'll catch sometimes when she wakes up for same in the morning.  Right now her stomach is been bothering her somewhat, and she is taking both a PPI and an H2 blocker. She has been avoiding her NSAIDs.  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  Trigger finger: 3rd and 4th.   Not as bad as before, ut sometimes.  Thumb spica   Past Medical History, Surgical History, Social History, Family History, Problem List, Medications, and Allergies have been reviewed and updated if relevant.  GEN: No fevers, chills. Nontoxic. Primarily MSK c/o today. MSK: Detailed in the HPI GI: tolerating PO intake without difficulty Neuro: No numbness, parasthesias, or tingling associated. Otherwise the pertinent positives of the ROS are noted above.   Objective:   BP 140/76  Pulse 73  Temp(Src) 98 F (36.7 C) (Oral)  Ht _0  (1.549 m)  Wt 179 lb 4 oz (81.307 kg)  BMI 33.89 kg/m2  SpO2  94%   GEN: WDWN, NAD, Non-toxic, Alert & Oriented x 3 HEENT: Atraumatic, Normocephalic.  Ears and Nose: No external deformity. EXTR: No clubbing/cyanosis/edema NEURO: Normal gait.  PSYCH: Normally interactive. Conversant. Not depressed or anxious appearing.  Calm demeanor.    On both hands the patient has diffuse osteoarthritic changes and most of her DIP joint PIP joints. She also has some tenderness at the MCP joints. On the 1st digit she has pain at the [facility] joint as well. This is all mild to moderate in nature. She does not have any significant synovitis. She has diffuse Heberden's nodes and changes on the joints themselves. She does have pain when she opensand contracts her hand.I cannot make her digits significantly trigger on examination.  Radiology: No results found.  Assessment and Plan:   Osteoarthritis, hand, primary localized, unspecified laterality  Primary osteoarthritis of both first carpometacarpal joints  Trigger finger, acquired  Refer to the patient instructions sections for details of plan shared with patient.   >25 minutes spent in face to face time with patient, >50% spent in counselling or coordination of care: I went over hand anatomy and hand arthritis with the patient at length. I reviewed many different things that she can try totry to keep this to be less limiting for her. Ultimately, this is challenging, and she will likely have some symptoms intermittently for the rest of her life. For now, I would not  inject her trigger fingers, because they're not very painful. Thumb spica splint at night to try.  She is going to followup only on a p.r.n. Basis.  Patient Instructions  Alleve 2 tabs by mouth two times a day over the counter: Take at least for 2 - 3 weeks. This is equal to a prescripton strength dose (GENERIC CHEAPER EQUIVALENT IS NAPROXEN SODIUM)   Take Tylenol/Acetaminophen ES (547m) 2 tabs by mouth three times a day max as needed.   Ice 20 minutes,  twice a day for a week  Any type of linament that you want (Horse linament, biofreeze, etc.)   Topical Capzaicin Cream, as needed (wear glove to put on) For flares, corticosteroid injections help. Glucosamine and Chondroitin often helpful - will take about 3 months to see if you have an effect. If you do, great, keep them up, if none at that point, no need to take in the future.  Omega-3 fish oils may help, 2 grams daily Ice joints on bad days, 20 min, 2-3 x / day REGULAR EXERCISE: swimming, Yoga, Tai Chi, bicycle (NON-IMPACT activity)      Signed,  Jayden Rudge T. Dammon Makarewicz, MD   Patient's Medications  New Prescriptions   No medications on file  Previous Medications   ASCORBIC ACID (VITAMIN C) 500 MG TABLET    Take 500 mg by mouth daily.     CHOLECALCIFEROL ([facility]S HIGH POTENCY VITAMIN D) 1000 UNITS TABLET    Take 1,000 Units by mouth daily.     FISH OIL-OMEGA-3 FATTY ACIDS 1000 MG CAPSULE    Take one capsule by mouth four times a day    LORATADINE (CLARITIN) 10 MG TABLET    Take 10 mg by mouth daily as needed.    OMEPRAZOLE-SODIUM BICARBONATE (ZEGERID) 40-1100 MG PER CAPSULE    Take one tab each bedtime   RANITIDINE (ZANTAC) 150 MG TABLET    Take 1 tablet (150 mg total) by mouth 2 (two) times daily.   SIMVASTATIN (ZOCOR) 40 MG TABLET    take 1 tablet by mouth once daily   SULINDAC (CLINORIL) 200 MG TABLET    Take one tab twice a day for 2 weeks then as needed  Modified Medications   No medications on file  Discontinued Medications   No medications on file

## 2014-07-20 NOTE — Progress Notes (Signed)
Pre visit review using our clinic review tool, if applicable. No additional management support is needed unless otherwise documented below in the visit note. 

## 2014-07-20 NOTE — Patient Instructions (Signed)
Alleve 2 tabs by mouth two times a day over the counter: Take at least for 2 - 3 weeks. This is equal to a prescripton strength dose (GENERIC CHEAPER EQUIVALENT IS NAPROXEN SODIUM)   Take Tylenol/Acetaminophen ES (528m) 2 tabs by mouth three times a day max as needed.   Ice 20 minutes, twice a day for a week  Any type of linament that you want (Horse linament, biofreeze, etc.)   Topical Capzaicin Cream, as needed (wear glove to put on) For flares, corticosteroid injections help. Glucosamine and Chondroitin often helpful - will take about 3 months to see if you have an effect. If you do, great, keep them up, if none at that point, no need to take in the future.  Omega-3 fish oils may help, 2 grams daily Ice joints on bad days, 20 min, 2-3 x / day REGULAR EXERCISE: swimming, Yoga, Tai Chi, bicycle (NON-IMPACT activity)

## 2014-07-21 DIAGNOSIS — M19049 Primary osteoarthritis, unspecified hand: Secondary | ICD-10-CM | POA: Insufficient documentation

## 2014-07-29 ENCOUNTER — Telehealth: Payer: Self-pay

## 2014-07-29 MED ORDER — RANITIDINE HCL 150 MG PO TABS: 150.0000 mg | ORAL_TABLET | Freq: Two times a day (BID) | ORAL | Status: DC

## 2014-07-29 NOTE — Telephone Encounter (Signed)
Pt left v/m; pt was started on Zantac taking one tab twice a day; reflux is better and pt request refills to [facility] [address]. Pt has enough med for 1 week. Pt request cb.

## 2014-07-29 NOTE — Telephone Encounter (Signed)
Thanks for the update.  I am glad that it is helping. eRx sent as requested.

## 2015-01-10 ENCOUNTER — Other Ambulatory Visit: Payer: Self-pay | Admitting: Family Medicine

## 2015-01-25 ENCOUNTER — Other Ambulatory Visit: Payer: Self-pay

## 2015-02-23 ENCOUNTER — Other Ambulatory Visit: Payer: Self-pay | Admitting: Family Medicine

## 2015-04-09 ENCOUNTER — Other Ambulatory Visit: Payer: Self-pay | Admitting: Family Medicine

## 2015-05-08 ENCOUNTER — Other Ambulatory Visit: Payer: Self-pay | Admitting: Obstetrics and Gynecology

## 2015-05-09 LAB — CYTOLOGY - PAP

## 2015-05-16 LAB — HM MAMMOGRAPHY: HM Mammogram: NORMAL

## 2015-05-22 ENCOUNTER — Encounter: Payer: Self-pay | Admitting: *Deleted

## 2015-05-22 ENCOUNTER — Telehealth: Payer: Self-pay

## 2015-05-22 NOTE — Telephone Encounter (Signed)
Called and spoke with patient, and notified them that they were due for a Mammogram. Patient states that she already had a Mammogram at [facility] a couple days ago. Patient states that the results came back normal. Patient says that she will bring all of the information regarding the Mammogram when she comes in for her annual exam on 07/18/15 with Dr. Deborra Medina.

## 2015-06-10 ENCOUNTER — Other Ambulatory Visit: Payer: Self-pay | Admitting: Family Medicine

## 2015-07-05 ENCOUNTER — Other Ambulatory Visit: Payer: Self-pay | Admitting: Family Medicine

## 2015-07-05 DIAGNOSIS — Z0000 Encounter for general adult medical examination without abnormal findings: Secondary | ICD-10-CM

## 2015-07-06 ENCOUNTER — Other Ambulatory Visit: Payer: Self-pay | Admitting: Family Medicine

## 2015-07-11 ENCOUNTER — Other Ambulatory Visit (INDEPENDENT_AMBULATORY_CARE_PROVIDER_SITE_OTHER): Payer: Medicare Other

## 2015-07-11 DIAGNOSIS — Z0000 Encounter for general adult medical examination without abnormal findings: Secondary | ICD-10-CM

## 2015-07-11 DIAGNOSIS — E782 Mixed hyperlipidemia: Secondary | ICD-10-CM

## 2015-07-11 LAB — CBC WITH DIFFERENTIAL/PLATELET
BASOS ABS: 0 10*3/uL (ref 0.0–0.1)
BASOS PCT: 0.4 % (ref 0.0–3.0)
EOS ABS: 0.1 10*3/uL (ref 0.0–0.7)
Eosinophils Relative: 2.6 % (ref 0.0–5.0)
HEMATOCRIT: 38.4 % (ref 36.0–46.0)
HEMOGLOBIN: 13.1 g/dL (ref 12.0–15.0)
LYMPHS PCT: 29.8 % (ref 12.0–46.0)
Lymphs Abs: 1.6 10*3/uL (ref 0.7–4.0)
MCHC: 34.2 g/dL (ref 30.0–36.0)
MCV: 91.3 fl (ref 78.0–100.0)
Monocytes Absolute: 0.4 10*3/uL (ref 0.1–1.0)
Monocytes Relative: 7.5 % (ref 3.0–12.0)
Neutro Abs: 3.1 10*3/uL (ref 1.4–7.7)
Neutrophils Relative %: 59.7 % (ref 43.0–77.0)
Platelets: 160 10*3/uL (ref 150.0–400.0)
RBC: 4.21 Mil/uL (ref 3.87–5.11)
RDW: 12.2 % (ref 11.5–15.5)
WBC: 5.3 10*3/uL (ref 4.0–10.5)

## 2015-07-11 LAB — COMPREHENSIVE METABOLIC PANEL
ALBUMIN: 3.7 g/dL (ref 3.5–5.2)
ALT: 10 U/L (ref 0–35)
AST: 12 U/L (ref 0–37)
Alkaline Phosphatase: 65 U/L (ref 39–117)
BILIRUBIN TOTAL: 0.8 mg/dL (ref 0.2–1.2)
BUN: 14 mg/dL (ref 6–23)
CALCIUM: 8.9 mg/dL (ref 8.4–10.5)
CO2: 27 mEq/L (ref 19–32)
CREATININE: 0.68 mg/dL (ref 0.40–1.20)
Chloride: 106 mEq/L (ref 96–112)
GFR: 90.7 mL/min (ref >60.00)
Glucose, Bld: 86 mg/dL (ref 70–99)
Potassium: 4.3 mEq/L (ref 3.5–5.1)
Sodium: 139 mEq/L (ref 135–145)
TOTAL PROTEIN: 6.8 g/dL (ref 6.0–8.3)

## 2015-07-11 LAB — LIPID PANEL
CHOLESTEROL: 161 mg/dL (ref 0–200)
HDL: 50.6 mg/dL (ref >39.00)
LDL Cholesterol: 92 mg/dL (ref 0–99)
NonHDL: 110.04
TRIGLYCERIDES: 92 mg/dL (ref 0.0–149.0)
Total CHOL/HDL Ratio: 3
VLDL: 18.4 mg/dL (ref 0.0–40.0)

## 2015-07-11 LAB — TSH: TSH: 3.63 u[IU]/mL (ref 0.35–4.50)

## 2015-07-18 ENCOUNTER — Telehealth: Payer: Self-pay | Admitting: Family Medicine

## 2015-07-18 ENCOUNTER — Encounter: Payer: Self-pay | Admitting: Family Medicine

## 2015-07-18 ENCOUNTER — Ambulatory Visit (INDEPENDENT_AMBULATORY_CARE_PROVIDER_SITE_OTHER): Payer: Medicare Other | Admitting: Family Medicine

## 2015-07-18 VITALS — BP 112/70 | HR 80 | Temp 97.8°F | Ht 61.25 in | Wt 182.5 lb

## 2015-07-18 DIAGNOSIS — M899 Disorder of bone, unspecified: Secondary | ICD-10-CM | POA: Diagnosis not present

## 2015-07-18 DIAGNOSIS — M25551 Pain in right hip: Secondary | ICD-10-CM | POA: Diagnosis not present

## 2015-07-18 DIAGNOSIS — M949 Disorder of cartilage, unspecified: Secondary | ICD-10-CM | POA: Diagnosis not present

## 2015-07-18 DIAGNOSIS — Z0000 Encounter for general adult medical examination without abnormal findings: Secondary | ICD-10-CM | POA: Diagnosis not present

## 2015-07-18 DIAGNOSIS — E782 Mixed hyperlipidemia: Secondary | ICD-10-CM

## 2015-07-18 MED ORDER — SIMVASTATIN 40 MG PO TABS: 40.0000 mg | ORAL_TABLET | Freq: Every day | ORAL | Status: DC

## 2015-07-18 NOTE — Assessment & Plan Note (Addendum)
The patients weight, height, BMI and visual acuity have been recorded in the chart.  Cognitive function assessed.   I have made referrals, counseling and provided education to the patient based review of the above and I have provided the pt with a written personalized care plan for preventive services.  

## 2015-07-18 NOTE — Progress Notes (Signed)
Pre visit review using our clinic review tool, if applicable. No additional management support is needed unless otherwise documented below in the visit note. 

## 2015-07-18 NOTE — Telephone Encounter (Signed)
Spoke to pt and advised per Dr Aron.  

## 2015-07-18 NOTE — Progress Notes (Signed)
Very pleasant 69 yo female here for annual medicare wellness visit and follow up of chronic medical conditions.  I have personally reviewed the Medicare Annual Wellness questionnaire and have noted 1. The patient's medical and social history 2. Their use of alcohol, tobacco or illicit drugs 3. Their current medications and supplements 4. The patient's functional ability including ADL's, fall risks, home safety risks and hearing or visual             impairment. 5. Diet and physical activities 6. Evidence for depression or mood disorders  End of life wishes discussed and updated in Social History.  The roster of all physicians providing medical care to patient - is listed in the Snapshot section of the chart.  Right hip pain-  Right hip pain for months.  Now waking her from sleep.  Can radiate to her groin.  Uses alleve which helps but tries not to take it. 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   Td 11/30/08 Zoster 11/30/08 Pneumovax 01/13/13 Prevnar 13- 04/05/14 Has already received her flu shot.  Colonoscopy - Deatra Ina- 07/25/11- 10 year recall Mammogram 05/16/15 Bone density 04/07/13  HLD- well controlled on current dose of zocor.  Denies myalgias. Lab Results  Component Value Date   CHOL 161 07/11/2015   HDL 50.60 07/11/2015   LDLCALC 92 07/11/2015   TRIG 92.0 07/11/2015   CHOLHDL 3 07/11/2015   Lab Results  Component Value Date   CREATININE 0.68 07/11/2015   Lab Results  Component Value Date   TSH 3.63 07/11/2015   Lab Results  Component Value Date   WBC 5.3 07/11/2015   HGB 13.1 07/11/2015   HCT 38.4 07/11/2015   MCV 91.3 07/11/2015   PLT 160.0 07/11/2015     HLD- on Simvastatin 40 mg daily.  No complaints, no myalgias. Lab Results  Component Value Date   CHOL 161 07/11/2015   HDL 50.60 07/11/2015   LDLCALC 92 07/11/2015   TRIG 92.0 07/11/2015   CHOLHDL 3 07/11/2015     Has OBGYN, Dr. Matthew Saras.  Osteopenia- brings in recent DEXA from 05/08/15- normal spine,  osteopenia of hips, lowest density right hip -1.7.  Patient Active Problem List   Diagnosis Date Noted  . 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 Medicare annual wellness visit, subsequent 07/14/2014  . HIATAL HERNIA 11/30/2008  . Disorder of bone and cartilage 11/30/2008  . GERD 06/01/2008  . HYPERLIPIDEMIA, MIXED 03/19/2007   Past Medical History  Diagnosis Date  . GERD (gastroesophageal reflux disease)   . Hyperlipidemia   . Hemangioma 2010  . Fatty liver 2010  . H. pylori infection   . Hiatal hernia    Past Surgical History  Procedure Laterality Date  . Knee surgery  06/29/2007    Meniscus tear, left knee surgery  . Cholecystectomy     Social History  Substance Use Topics  . Smoking status: Never Smoker   . Smokeless tobacco: Never Used  . Alcohol Use: No   Family History  Problem Relation Age of Onset  . Colon cancer Neg Hx    Allergies  Allergen Reactions  . Dexilant [Dexlansoprazole] Diarrhea  . Sulfonamide Derivatives     REACTION: as child   Current Outpatient Prescriptions on File Prior to Visit  Medication Sig Dispense Refill  . Ascorbic Acid (VITAMIN C) 500 MG tablet Take 500 mg by mouth daily.      . Cholecalciferol ([facility] HIGH POTENCY VITAMIN D) 1000 UNITS tablet Take 1,000 Units by mouth daily.      Marland Kitchen  fish oil-omega-3 fatty acids 1000 MG capsule Take one capsule by mouth four times a day     . loratadine (CLARITIN) 10 MG tablet Take 10 mg by mouth daily as needed.     Marland Kitchen omeprazole-sodium bicarbonate (ZEGERID) 40-1100 MG per capsule take 1 capsule by mouth at bedtime 30 capsule 5  . ranitidine (ZANTAC) 150 MG tablet Take 1 tablet (150 mg total) by mouth 2 (two) times daily. 60 tablet 0  . sulindac (CLINORIL) 200 MG tablet Take one tab twice a day for 2 weeks then as needed 60 tablet 2   No current facility-administered medications on file prior to visit.   The PMH, PSH, Social History, Family History, Medications, and allergies have been  reviewed in [facility], and have been updated if relevant.  Review of Systems  Constitutional: Negative.   HENT: Negative.   Eyes: Negative.   Respiratory: Negative.   Cardiovascular: Negative.   Gastrointestinal: Negative.   Endocrine: Negative.   Genitourinary: Negative.   Musculoskeletal: Positive for arthralgias. Negative for myalgias, back pain, joint swelling, gait problem, neck pain and neck stiffness.  Skin: Negative.   Allergic/Immunologic: Negative.   Neurological: Negative.   Hematological: Negative.   Psychiatric/Behavioral: Negative.     Physical exam: BP 112/70 mmHg  Pulse 80  Temp(Src) 97.8 F (36.6 C) (Oral)  Ht 5' 1.25" (1.556 m)  Wt 182 lb 8 oz (82.781 kg)  BMI 34.19 kg/m2  SpO2 96%  Wt Readings from Last 3 Encounters:  07/18/15 182 lb 8 oz (82.781 kg)  07/20/14 179 lb 4 oz (81.307 kg)  07/14/14 176 lb (79.833 kg)    General:  alert, well-developed, well-nourished, well-hydrated, and overweight-appearing.  NAD Head:  normocephalic and atraumatic.   Eyes:  vision grossly intact, pupils equal, pupils round, and pupils reactive to light.   Ears:  R ear normal and L ear normal.   Nose:  no external deformity.   Mouth:  good dentition.   Neck:  No deformities, masses, or tenderness noted. Lungs:  Normal respiratory effort, chest expands symmetrically. Lungs are clear to auscultation, no crackles or wheezes. Heart:  Normal rate and regular rhythm. S1 and S2 normal without gallop, murmur, click, rub or other extra sounds. Abdomen:  soft, non-tender, and normal bowel sounds.   Msk:  No deformity or scoliosis noted of thoracic or lumbar spine.   +TTP over right trochanteric bursa, normal range of motion with hip external and internal rotation Extremities:  no edema Neurologic:  alert & oriented X3 and cranial nerves II-XII intact.   Skin:  Intact without suspicious lesions or rashes Psych:  normally interactive and moderately anxious.

## 2015-07-18 NOTE — Patient Instructions (Signed)
Good to see you. Try Tylenol PM nightly. Keep me updated.

## 2015-07-18 NOTE — Telephone Encounter (Signed)
Our records indicate that her last colonoscopy was done by Dr. Deatra Ina on 07/25/11 and she is therefore not yet due for repeat screening colonoscopy.

## 2015-07-18 NOTE — Assessment & Plan Note (Signed)
Well controlled on current dose of zocor. No changes made. 

## 2015-07-18 NOTE — Telephone Encounter (Signed)
Pt called to inquire about colonoscopy. She thinks it has been 10 years since last one.  Please return call at [phone] thanks

## 2015-07-19 NOTE — Assessment & Plan Note (Signed)
New- probable OA with some signs of trochanteric bursitis on exam. Advised tylenol pm qhs prn pain.  Call or return to clinic prn if these symptoms worsen or fail to improve as anticipated. The patient indicates understanding of these issues and agrees with the plan.

## 2015-07-19 NOTE — Addendum Note (Signed)
Addended by: Lucille Passy on: 07/19/2015 11:28 AM   Modules accepted: Miquel Dunn

## 2016-01-04 ENCOUNTER — Other Ambulatory Visit: Payer: Self-pay | Admitting: Family Medicine

## 2016-04-24 ENCOUNTER — Other Ambulatory Visit: Payer: Self-pay | Admitting: Family Medicine

## 2016-06-14 ENCOUNTER — Ambulatory Visit (INDEPENDENT_AMBULATORY_CARE_PROVIDER_SITE_OTHER): Payer: Medicare Other | Admitting: Sports Medicine

## 2016-06-14 ENCOUNTER — Encounter: Payer: Self-pay | Admitting: Sports Medicine

## 2016-06-14 ENCOUNTER — Ambulatory Visit (INDEPENDENT_AMBULATORY_CARE_PROVIDER_SITE_OTHER): Payer: Medicare Other

## 2016-06-14 DIAGNOSIS — M7662 Achilles tendinitis, left leg: Secondary | ICD-10-CM

## 2016-06-14 DIAGNOSIS — M79672 Pain in left foot: Secondary | ICD-10-CM

## 2016-06-14 MED ORDER — TRIAMCINOLONE ACETONIDE 10 MG/ML IJ SUSP: 10.0000 mg | Freq: Once | INTRAMUSCULAR | Status: DC

## 2016-06-14 NOTE — Patient Instructions (Signed)

## 2016-06-14 NOTE — Progress Notes (Signed)
Subjective: Leah Melendez is a 70 y.o. female patient who presents to office for evaluation of Left heel pain. Patient complains of progressive pain especially over the last 3 weeks at Left foot back of heel at the Achilles. Reports she has been wearing more flatter shoes and thinks that this may have aggravated it. Patient has tried no treatment. Patient denies any other pedal complaints.   Patient Active Problem List   Diagnosis Date Noted  . Right hip pain 07/18/2015  . 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 Medicare annual wellness visit, subsequent 07/14/2014  . HIATAL HERNIA 11/30/2008  . Disorder of bone and cartilage 11/30/2008  . GERD 06/01/2008  . HYPERLIPIDEMIA, MIXED 03/19/2007    Current Outpatient Prescriptions on File Prior to Visit  Medication Sig Dispense Refill  . Ascorbic Acid (VITAMIN C) 500 MG tablet Take 500 mg by mouth daily.      . Cholecalciferol ([employer] HIGH POTENCY VITAMIN D) 1000 UNITS tablet Take 1,000 Units by mouth daily.      . fish oil-omega-3 fatty acids 1000 MG capsule Take one capsule by mouth four times a day     . loratadine (CLARITIN) 10 MG tablet Take 10 mg by mouth daily as needed.     Marland Kitchen omeprazole-sodium bicarbonate (ZEGERID) 40-1100 MG capsule take 1 capsule by mouth at bedtime 30 capsule 5  . ranitidine (ZANTAC) 150 MG tablet Take 1 tablet (150 mg total) by mouth 2 (two) times daily. 60 tablet 0  . simvastatin (ZOCOR) 40 MG tablet Take 1 tablet (40 mg total) by mouth daily. OFFICE VISIT REQUIRED FOR ADDITIONAL REFILLS 90 tablet 0  . sulindac (CLINORIL) 200 MG tablet Take one tab twice a day for 2 weeks then as needed 60 tablet 2   No current facility-administered medications on file prior to visit.     Allergies  Allergen Reactions  . Dexilant [Dexlansoprazole] Diarrhea  . Sulfonamide Derivatives     REACTION: as child    Objective:  General: Alert and oriented x3 in no acute distress  Dermatology: No open lesions  bilateral lower extremities, no webspace macerations, no ecchymosis bilateral, all nails x 10 are well manicured.  Vascular: Dorsalis Pedis and Posterior Tibial pedal pulses 2/4, Capillary Fill Time 3 seconds, + pedal hair growth bilateral, mild varicosities, no edema bilateral lower extremities, Temperature gradient within normal limits.  Neurology: Gross sensation intact via light touch bilateral,  - Tinels sign left foot.   Musculoskeletal: Mild tenderness with palpation at medial insertion of the Achilles on Left, there is calcaneal exostosis with mild soft tissue fullness present and decreased ankle rom with knee extending  vs flexed resembling gastroc equnius bilateral, The achilles tendon feels intact with no nodularity or palpable dell, Thompson sign negative, Subtalar and midtarsal joint range of motion is within normal limits, there is no other symptomatic deformity noted bilateral.   Xrays  Left Foot    Impression: Normal osseous mineralization. Joint spaces narrowing suggestive of arthritis. No fracture/dislocation/boney destruction. Calcaneal spur present. Kager's triangle intact with no obliteration. No soft tissue abnormalities or radiopaque foreign bodies.   Assessment and Plan: Problem List Items Addressed This Visit    None    Visit Diagnoses    Left foot pain    -  Primary   Relevant Medications   triamcinolone acetonide (KENALOG) 10 MG/ML injection 10 mg   Other Relevant Orders   DG Foot 2 Views Left   Tendonitis, Achilles, left  Achilles bursitis, left       Relevant Medications   triamcinolone acetonide (KENALOG) 10 MG/ML injection 10 mg     -Complete examination performed -Xrays reviewed -Discussed treatement options for tendonitis/bursitis -After oral consent and aseptic prep, injected a mixture containing 1 ml of 2%  plain lidocaine, 0.5 ml 0.5% plain marcaine, 0.5 ml of kenalog 10 and 0.5 ml of dexamethasone phosphate into Left heel at medial insertion  with care not to violate achilles tendon without complication. Post-injection care discussed with patient.  -Continue with Sulindac or Tylenol PM, dispensed night splint and heel lifts and instructed on use -Advised icing and gentle stretching and to refrain from steps or heels or exertional activities that could cause exacerbation in symtoms -If No improvement will consider MRI/PT/EPAT -Patient to return to office 3 weeks  or sooner if condition worsens.  Landis Martins, DPM

## 2016-06-27 ENCOUNTER — Other Ambulatory Visit: Payer: Self-pay | Admitting: Family Medicine

## 2016-07-05 ENCOUNTER — Encounter: Payer: Self-pay | Admitting: Podiatry

## 2016-07-05 ENCOUNTER — Ambulatory Visit (INDEPENDENT_AMBULATORY_CARE_PROVIDER_SITE_OTHER): Payer: Medicare Other | Admitting: Podiatry

## 2016-07-05 DIAGNOSIS — M722 Plantar fascial fibromatosis: Secondary | ICD-10-CM

## 2016-07-05 DIAGNOSIS — M79672 Pain in left foot: Secondary | ICD-10-CM

## 2016-07-05 MED ORDER — BETAMETHASONE SOD PHOS & ACET 6 (3-3) MG/ML IJ SUSP: 12.0000 mg | Freq: Once | INTRAMUSCULAR | Status: DC

## 2016-07-05 NOTE — Patient Instructions (Signed)

## 2016-07-05 NOTE — Progress Notes (Signed)
Subjective: Patient comes in today for follow-up evaluation of left heel pain. Patient states that she's doing well she's wearing her night splint at night which is helping. Patient has no other complaints at this time.  Objective: Physical Exam General: The patient is alert and oriented x3 in no acute distress.  Dermatology: Skin is warm, dry and supple bilateral lower extremities. Negative for open lesions or macerations bilateral.   Vascular: Dorsalis Pedis and Posterior Tibial pulses palpable bilateral.  Capillary fill time is immediate to all digits.  Neurological: Epicritic and protective threshold intact bilateral.   Musculoskeletal: Tenderness to palpation at the medial calcaneal tubercale and through the insertion of the plantar fascia of the left foot. All other joints range of motion within normal limits bilateral. Strength 5/5 in all groups bilateral.   Radiographic exam:   Normal osseous mineralization. Joint spaces preserved. No fracture/dislocation/boney destruction. Calcaneal spur present with mild thickening of plantar fascia left. No other soft tissue abnormalities or radiopaque foreign bodies.   Assessment:  Problem List Items Addressed This Visit    None    Visit Diagnoses    Plantar fasciitis of left foot    -  Primary   Relevant Medications   betamethasone acetate-betamethasone sodium phosphate (CELESTONE) injection 12 mg   Pain in left foot       Relevant Medications   betamethasone acetate-betamethasone sodium phosphate (CELESTONE) injection 12 mg       Plan of Care:   1. Patient evaluated. Xrays reviewed.   2. Injection of 0.5cc Celestone soluspan injected into the left plantar fascia.  3. Instructed patient regarding therapies and modalities at home to alleviate symptoms.  4. Patient is to continue all the conservative therapies and modalities that she's doing at home to alleviate symptoms. 5. Return to clinic in 4 weeks.

## 2016-07-18 ENCOUNTER — Other Ambulatory Visit: Payer: Self-pay | Admitting: Family Medicine

## 2016-07-18 DIAGNOSIS — Z0000 Encounter for general adult medical examination without abnormal findings: Secondary | ICD-10-CM

## 2016-07-18 DIAGNOSIS — E782 Mixed hyperlipidemia: Secondary | ICD-10-CM

## 2016-07-19 ENCOUNTER — Telehealth: Payer: Self-pay

## 2016-07-19 DIAGNOSIS — Z1159 Encounter for screening for other viral diseases: Secondary | ICD-10-CM

## 2016-07-19 NOTE — Telephone Encounter (Signed)
Adding Hep C to lab orders

## 2016-07-22 ENCOUNTER — Ambulatory Visit (INDEPENDENT_AMBULATORY_CARE_PROVIDER_SITE_OTHER): Payer: Medicare Other

## 2016-07-22 ENCOUNTER — Other Ambulatory Visit (INDEPENDENT_AMBULATORY_CARE_PROVIDER_SITE_OTHER): Payer: Medicare Other

## 2016-07-22 VITALS — BP 138/90 | HR 67 | Temp 97.9°F | Ht 61.5 in | Wt 183.2 lb

## 2016-07-22 DIAGNOSIS — E782 Mixed hyperlipidemia: Secondary | ICD-10-CM

## 2016-07-22 DIAGNOSIS — Z0000 Encounter for general adult medical examination without abnormal findings: Secondary | ICD-10-CM | POA: Diagnosis not present

## 2016-07-22 DIAGNOSIS — Z1159 Encounter for screening for other viral diseases: Secondary | ICD-10-CM

## 2016-07-22 LAB — COMPREHENSIVE METABOLIC PANEL
ALK PHOS: 61 U/L (ref 39–117)
ALT: 10 U/L (ref 0–35)
AST: 13 U/L (ref 0–37)
Albumin: 3.6 g/dL (ref 3.5–5.2)
BUN: 14 mg/dL (ref 6–23)
CHLORIDE: 108 meq/L (ref 96–112)
CO2: 29 meq/L (ref 19–32)
Calcium: 8.8 mg/dL (ref 8.4–10.5)
Creatinine, Ser: 0.74 mg/dL (ref 0.40–1.20)
GFR: 82.03 mL/min (ref >60.00)
GLUCOSE: 56 mg/dL — AB (ref 70–99)
POTASSIUM: 3.9 meq/L (ref 3.5–5.1)
SODIUM: 142 meq/L (ref 135–145)
TOTAL PROTEIN: 6.8 g/dL (ref 6.0–8.3)
Total Bilirubin: 1 mg/dL (ref 0.2–1.2)

## 2016-07-22 LAB — CBC WITH DIFFERENTIAL/PLATELET
BASOS ABS: 0 10*3/uL (ref 0.0–0.1)
Basophils Relative: 0.4 % (ref 0.0–3.0)
Eosinophils Absolute: 0.1 10*3/uL (ref 0.0–0.7)
Eosinophils Relative: 2 % (ref 0.0–5.0)
HCT: 37.4 % (ref 36.0–46.0)
Hemoglobin: 12.9 g/dL (ref 12.0–15.0)
LYMPHS ABS: 1.6 10*3/uL (ref 0.7–4.0)
Lymphocytes Relative: 31.6 % (ref 12.0–46.0)
MCHC: 34.4 g/dL (ref 30.0–36.0)
MCV: 91 fl (ref 78.0–100.0)
MONO ABS: 0.5 10*3/uL (ref 0.1–1.0)
Monocytes Relative: 9.5 % (ref 3.0–12.0)
NEUTROS ABS: 2.9 10*3/uL (ref 1.4–7.7)
NEUTROS PCT: 56.5 % (ref 43.0–77.0)
PLATELETS: 167 10*3/uL (ref 150.0–400.0)
RBC: 4.11 Mil/uL (ref 3.87–5.11)
RDW: 12.5 % (ref 11.5–15.5)
WBC: 5.1 10*3/uL (ref 4.0–10.5)

## 2016-07-22 LAB — TSH: TSH: 2.23 u[IU]/mL (ref 0.35–4.50)

## 2016-07-22 LAB — LIPID PANEL
CHOL/HDL RATIO: 3
Cholesterol: 172 mg/dL (ref 0–200)
HDL: 49.5 mg/dL (ref >39.00)
LDL CALC: 103 mg/dL — AB (ref 0–99)
NonHDL: 122.11
Triglycerides: 96 mg/dL (ref 0.0–149.0)
VLDL: 19.2 mg/dL (ref 0.0–40.0)

## 2016-07-22 NOTE — Progress Notes (Signed)
Pre visit review using our clinic review tool, if applicable. No additional management support is needed unless otherwise documented below in the visit note. 

## 2016-07-22 NOTE — Progress Notes (Signed)
Subjective:   Leah Melendez is a 70 y.o. female who presents for Medicare Annual (Subsequent) preventive examination.  Review of Systems:  N/A Cardiac Risk Factors include: advanced age (>64men, >74 women);dyslipidemia;obesity (BMI >30kg/m2)     Objective:     Vitals: BP 138/90 (BP Location: Right Arm, Patient Position: Sitting, Cuff Size: Normal)   Pulse 67   Temp 97.9 F (36.6 C) (Oral)   Ht 5' 1.5" (1.562 m) Comment: no shoes  Wt 183 lb 4 oz (83.1 kg)   SpO2 95%   BMI 34.06 kg/m   Body mass index is 34.06 kg/m.   Tobacco History  Smoking Status  . Never Smoker  Smokeless Tobacco  . Never Used     Counseling given: No   Past Medical History:  Diagnosis Date  . Fatty liver 2010  . GERD (gastroesophageal reflux disease)   . H. pylori infection   . Hemangioma 2010  . Hiatal hernia   . Hyperlipidemia    Past Surgical History:  Procedure Laterality Date  . CHOLECYSTECTOMY    . KNEE SURGERY  06/29/2007   Meniscus tear, left knee surgery   Family History  Problem Relation Age of Onset  . Colon cancer Neg Hx    History  Sexual Activity  . Sexual activity: Yes    Outpatient Encounter Prescriptions as of 07/22/2016  Medication Sig  . Ascorbic Acid (VITAMIN C) 500 MG tablet Take 500 mg by mouth daily.    Marland Kitchen aspirin EC 81 MG tablet Take 81 mg by mouth daily.  . Cholecalciferol ([employer] HIGH POTENCY VITAMIN D) 1000 UNITS tablet Take 1,000 Units by mouth daily.    . fish oil-omega-3 fatty acids 1000 MG capsule Take one capsule by mouth four times a day   . FLUAD 0.5 ML SUSY   . loratadine (CLARITIN) 10 MG tablet Take 10 mg by mouth daily as needed.   Marland Kitchen omeprazole-sodium bicarbonate (ZEGERID) 40-1100 MG capsule take 1 capsule by mouth at bedtime  . ranitidine (ZANTAC) 150 MG tablet Take 1 tablet (150 mg total) by mouth 2 (two) times daily. (Patient taking differently: Take 150 mg by mouth daily as needed. )  . simvastatin (ZOCOR) 40 MG tablet Take 1 tablet  (40 mg total) by mouth daily. OFFICE VISIT REQUIRED FOR ADDITIONAL REFILLS  . sulindac (CLINORIL) 200 MG tablet Take one tab twice a day for 2 weeks then as needed (Patient not taking: Reported on 07/22/2016)   Facility-Administered Encounter Medications as of 07/22/2016  Medication  . betamethasone acetate-betamethasone sodium phosphate (CELESTONE) injection 12 mg  . triamcinolone acetonide (KENALOG) 10 MG/ML injection 10 mg    Activities of Daily Living In your present state of health, do you have any difficulty performing the following activities: 07/22/2016  Hearing? N  Vision? N  Difficulty concentrating or making decisions? N  Walking or climbing stairs? N  Dressing or bathing? N  Doing errands, shopping? N  Preparing Food and eating ? N  Using the Toilet? N  In the past six months, have you accidently leaked urine? N  Do you have problems with loss of bowel control? N  Managing your Medications? N  Managing your Finances? N  Housekeeping or managing your Housekeeping? N  Some recent data might be hidden    Patient Care Team: Lucille Passy, MD as PCP - General (Family Medicine) Lemmie Evens. Matthew Saras, MD as Consulting Physician (Internal Medicine) Inda Castle, MD as Consulting Physician (Gastroenterology)    Assessment:  Hearing Screening   125Hz  250Hz  500Hz  1000Hz  2000Hz  3000Hz  4000Hz  6000Hz  8000Hz   Right ear:   40 40 40  40    Left ear:   40 40 40  40    Vision Screening Comments: Vision exam scheduled with [facility] on 07/22/16   Exercise Activities and Dietary recommendations Current Exercise Habits: Home exercise routine, Type of exercise: walking;stretching, Time (Minutes): 30, Frequency (Times/Week): 3, Weekly Exercise (Minutes/Week): 90, Intensity: Mild, Exercise limited by: None identified  Goals    . Increase physical activity          Starting 07/22/2016, I will continue to walk and stretch for 30 min 2-3 days per week.       Fall Risk Fall Risk   07/22/2016 07/18/2015 07/14/2014 01/13/2013  Falls in the past year? No No No No   Depression Screen PHQ 2/9 Scores 07/22/2016 07/18/2015 07/14/2014 01/13/2013  PHQ - 2 Score 0 0 0 0     Cognitive Testing MMSE - Mini Mental State Exam 07/22/2016  Orientation to time 5  Orientation to Place 5  Registration 3  Attention/ Calculation 0  Recall 3  Language- name 2 objects 0  Language- repeat 1  Language- follow 3 step command 3  Language- read & follow direction 0  Write a sentence 0  Copy design 0  Total score 20   PLEASE NOTE: A Mini-Cog screen was completed. Maximum score is 20. A value of 0 denotes this part of Folstein MMSE was not completed or the patient failed this part of the Mini-Cog screening.   Mini-Cog Screening Orientation to Time - Max 5 pts Orientation to Place - Max 5 pts Registration - Max 3 pts Recall - Max 3 pts Language Repeat - Max 1 pts Language Follow 3 Step Command - Max 3 pts  Immunization History  Administered Date(s) Administered  . Influenza Split 07/10/2011  . Influenza Whole 07/27/2010, 07/18/2012  . Influenza,inj,Quad PF,36+ Mos 07/14/2014  . Influenza-Unspecified 06/28/2013, 06/19/2015  . Pneumococcal Conjugate-13 04/05/2014  . Pneumococcal Polysaccharide-23 01/13/2013  . Td 11/30/2008  . Zoster 11/30/2008   Screening Tests Health Maintenance  Topic Date Due  . MAMMOGRAM  05/15/2017  . TETANUS/TDAP  11/30/2018  . COLONOSCOPY  07/24/2021  . INFLUENZA VACCINE  Addressed  . DEXA SCAN  Completed  . ZOSTAVAX  Completed  . Hepatitis C Screening  Completed  . PNA vac Low Risk Adult  Completed      Plan:     I have personally reviewed and addressed the Medicare Annual Wellness questionnaire and have noted the following in the patient's chart:  A. Medical and social history B. Use of alcohol, tobacco or illicit drugs  C. Current medications and supplements D. Functional ability and status E.  Nutritional status F.  Physical  activity G. Advance directives H. List of other physicians I.  Hospitalizations, surgeries, and ER visits in previous 12 months J.  [place] to include hearing, vision, cognitive, depression L. Referrals and appointments - none  In addition, I have reviewed and discussed with patient certain preventive protocols, quality metrics, and best practice recommendations. A written personalized care plan for preventive services as well as general preventive health recommendations were provided to patient.  See attached scanned questionnaire for additional information.   Signed,   Lindell Noe, MHA, BS, LPN Health Advisor

## 2016-07-22 NOTE — Progress Notes (Signed)
PCP notes:   Health maintenance:  Flu vaccine - per pt report, vaccine taken on 07/02/16 Hep C screening - completed  Abnormal screenings:   None  Patient concerns:   Pt has plantar fasciitis in both feet and is under the care of a podiatrist.   Nurse concerns:  None  Next PCP appt:   07/25/16 @ 0915

## 2016-07-22 NOTE — Progress Notes (Signed)
I reviewed health advisor's note, was available for consultation, and agree with documentation and plan.  

## 2016-07-22 NOTE — Patient Instructions (Signed)
Leah Melendez , Thank you for taking time to come for your Medicare Wellness Visit. I appreciate your ongoing commitment to your health goals. Please review the following plan we discussed and let me know if I can assist you in the future.   These are the goals we discussed: Goals    . Increase physical activity          Starting 07/22/2016, I will continue to walk and stretch for 30 min 2-3 days per week.        This is a list of the screening recommended for you and due dates:  Health Maintenance  Topic Date Due  . Mammogram  05/15/2017  . Tetanus Vaccine  11/30/2018  . Colon Cancer Screening  07/24/2021  . Flu Shot  Addressed  . DEXA scan (bone density measurement)  Completed  . Shingles Vaccine  Completed  .  Hepatitis C: One time screening is recommended by Center for Disease Control  (CDC) for  adults born from [birth year] through 1967.   Completed  . Pneumonia vaccines  Completed   Preventive Care for Adults  A healthy lifestyle and preventive care can promote health and wellness. Preventive health guidelines for adults include the following key practices.  . A routine yearly physical is a good way to check with your health care provider about your health and preventive screening. It is a chance to share any concerns and updates on your health and to receive a thorough exam.  . Visit your dentist for a routine exam and preventive care every 6 months. Brush your teeth twice a day and floss once a day. Good oral hygiene prevents tooth decay and gum disease.  . The frequency of eye exams is based on your age, health, family medical history, use  of contact lenses, and other factors. Follow your health care provider's ecommendations for frequency of eye exams.  . Eat a healthy diet. Foods like vegetables, fruits, whole grains, low-fat dairy products, and lean protein foods contain the nutrients you need without too many calories. Decrease your intake of foods high in solid fats, added  sugars, and salt. Eat the right amount of calories for you. Get information about a proper diet from your health care provider, if necessary.  . Regular physical exercise is one of the most important things you can do for your health. Most adults should get at least 150 minutes of moderate-intensity exercise (any activity that increases your heart rate and causes you to sweat) each week. In addition, most adults need muscle-strengthening exercises on 2 or more days a week.  Silver Sneakers may be a benefit available to you. To determine eligibility, you may visit the website: www.silversneakers.com or contact program at (918)069-1413 Mon-Fri between 8AM-8PM.   . Maintain a healthy weight. The body mass index (BMI) is a screening tool to identify possible weight problems. It provides an estimate of body fat based on height and weight. Your health care provider can find your BMI and can help you achieve or maintain a healthy weight.   For adults 20 years and older: ? A BMI below 18.5 is considered underweight. ? A BMI of 18.5 to 24.9 is normal. ? A BMI of 25 to 29.9 is considered overweight. ? A BMI of 30 and above is considered obese.   . Maintain normal blood lipids and cholesterol levels by exercising and minimizing your intake of saturated fat. Eat a balanced diet with plenty of fruit and vegetables. Blood tests for lipids  and cholesterol should begin at age 38 and be repeated every 5 years. If your lipid or cholesterol levels are high, you are over 50, or you are at high risk for heart disease, you may need your cholesterol levels checked more frequently. Ongoing high lipid and cholesterol levels should be treated with medicines if diet and exercise are not working.  . If you smoke, find out from your health care provider how to quit. If you do not use tobacco, please do not start.  . If you choose to drink alcohol, please do not consume more than 2 drinks per day. One drink is considered to  be 12 ounces (355 mL) of beer, 5 ounces (148 mL) of wine, or 1.5 ounces (44 mL) of liquor.  . If you are 45-61 years old, ask your health care provider if you should take aspirin to prevent strokes.  . Use sunscreen. Apply sunscreen liberally and repeatedly throughout the day. You should seek shade when your shadow is shorter than you. Protect yourself by wearing long sleeves, pants, a wide-brimmed hat, and sunglasses year round, whenever you are outdoors.  . Once a month, do a whole body skin exam, using a mirror to look at the skin on your back. Tell your health care provider of new moles, moles that have irregular borders, moles that are larger than a pencil eraser, or moles that have changed in shape or color.

## 2016-07-23 LAB — HEPATITIS C ANTIBODY: HCV Ab: NEGATIVE

## 2016-07-25 ENCOUNTER — Ambulatory Visit (INDEPENDENT_AMBULATORY_CARE_PROVIDER_SITE_OTHER): Payer: Medicare Other | Admitting: Family Medicine

## 2016-07-25 ENCOUNTER — Encounter: Payer: Self-pay | Admitting: Family Medicine

## 2016-07-25 ENCOUNTER — Other Ambulatory Visit: Payer: Self-pay | Admitting: Family Medicine

## 2016-07-25 VITALS — BP 142/92 | HR 67 | Temp 98.0°F | Ht 61.25 in | Wt 182.5 lb

## 2016-07-25 DIAGNOSIS — E782 Mixed hyperlipidemia: Secondary | ICD-10-CM

## 2016-07-25 DIAGNOSIS — Z01419 Encounter for gynecological examination (general) (routine) without abnormal findings: Secondary | ICD-10-CM | POA: Insufficient documentation

## 2016-07-25 DIAGNOSIS — Z0000 Encounter for general adult medical examination without abnormal findings: Secondary | ICD-10-CM

## 2016-07-25 NOTE — Assessment & Plan Note (Signed)
Reviewed preventive care protocols, scheduled due services, and updated immunizations Discussed nutrition, exercise, diet, and healthy lifestyle.  

## 2016-07-25 NOTE — Assessment & Plan Note (Signed)
Well controlled. No changes made to current dose of statin.

## 2016-07-25 NOTE — Progress Notes (Signed)
Very pleasant 70 yo female here for CPX and follow up of chronic medical conditions.  Annual medicare wellness visit with Candis Musa, RN on 07/22/16. Note reviewed.  Colonoscopy - Deatra Ina- 07/25/11- 10 year recall Mammogram 05/16/15 Bone density 2016  HLD- well controlled on current dose of zocor.  Denies myalgias. Lab Results  Component Value Date   CHOL 172 07/22/2016   HDL 49.50 07/22/2016   LDLCALC 103 (H) 07/22/2016   TRIG 96.0 07/22/2016   CHOLHDL 3 07/22/2016   Lab Results  Component Value Date   CREATININE 0.74 07/22/2016   Lab Results  Component Value Date   TSH 2.23 07/22/2016   Lab Results  Component Value Date   WBC 5.1 07/22/2016   HGB 12.9 07/22/2016   HCT 37.4 07/22/2016   MCV 91.0 07/22/2016   PLT 167.0 07/22/2016     Has OBGYN, Dr. Matthew Saras.   Patient Active Problem List  Diagnosis  . HYPERLIPIDEMIA, MIXED  . GERD  . HIATAL HERNIA  . Disorder of bone and cartilage  . Osteoarthritis, hand, primary localized  . Well woman exam   Past Medical History:  Diagnosis Date  . Fatty liver 2010  . GERD (gastroesophageal reflux disease)   . H. pylori infection   . Hemangioma 2010  . Hiatal hernia   . Hyperlipidemia    Past Surgical History:  Procedure Laterality Date  . CHOLECYSTECTOMY    . KNEE SURGERY  06/29/2007   Meniscus tear, left knee surgery   Social History  Substance Use Topics  . Smoking status: Never Smoker  . Smokeless tobacco: Never Used  . Alcohol use No   Family History  Problem Relation Age of Onset  . Colon cancer Neg Hx    Allergies  Allergen Reactions  . Dexilant [Dexlansoprazole] Diarrhea  . Sulfonamide Derivatives     REACTION: as child   Current Outpatient Prescriptions on File Prior to Visit  Medication Sig Dispense Refill  . Ascorbic Acid (VITAMIN C) 500 MG tablet Take 500 mg by mouth daily.      Marland Kitchen aspirin EC 81 MG tablet Take 81 mg by mouth daily.    . Cholecalciferol ([facility] HIGH POTENCY VITAMIN D) 1000  UNITS tablet Take 1,000 Units by mouth daily.      . fish oil-omega-3 fatty acids 1000 MG capsule Take one capsule by mouth four times a day     . FLUAD 0.5 ML SUSY     . loratadine (CLARITIN) 10 MG tablet Take 10 mg by mouth daily as needed.     Marland Kitchen omeprazole-sodium bicarbonate (ZEGERID) 40-1100 MG capsule take 1 capsule by mouth at bedtime 30 capsule 0  . ranitidine (ZANTAC) 150 MG tablet Take 1 tablet (150 mg total) by mouth 2 (two) times daily. (Patient taking differently: Take 150 mg by mouth daily as needed. ) 60 tablet 0  . simvastatin (ZOCOR) 40 MG tablet Take 1 tablet (40 mg total) by mouth daily. OFFICE VISIT REQUIRED FOR ADDITIONAL REFILLS 90 tablet 0  . sulindac (CLINORIL) 200 MG tablet Take one tab twice a day for 2 weeks then as needed 60 tablet 2   Current Facility-Administered Medications on File Prior to Visit  Medication Dose Route Frequency Provider Last Rate Last Dose  . betamethasone acetate-betamethasone sodium phosphate (CELESTONE) injection 12 mg  12 mg Intramuscular Once Edrick Kins, DPM      . triamcinolone acetonide (KENALOG) 10 MG/ML injection 10 mg  10 mg Other Once Landis Martins, DPM  The PMH, PSH, Social History, Family History, Medications, and allergies have been reviewed in [facility], and have been updated if relevant.  Review of Systems  Constitutional: Negative.   HENT: Negative.   Eyes: Negative.   Respiratory: Negative.   Cardiovascular: Negative.   Gastrointestinal: Negative.   Endocrine: Negative.   Genitourinary: Negative.   Musculoskeletal: Negative for arthralgias, back pain, gait problem, joint swelling, myalgias, neck pain and neck stiffness.  Skin: Negative.   Allergic/Immunologic: Negative.   Neurological: Negative.   Hematological: Negative.   Psychiatric/Behavioral: Negative.     Physical exam: BP (!) 142/92   Pulse 67   Temp 98 F (36.7 C) (Oral)   Ht 5' 1.25" (1.556 m)   Wt 182 lb 8 oz (82.8 kg)   SpO2 98%   BMI 34.20 kg/m    Wt Readings from Last 3 Encounters:  07/25/16 182 lb 8 oz (82.8 kg)  07/22/16 183 lb 4 oz (83.1 kg)  07/18/15 182 lb 8 oz (82.8 kg)   BP Readings from Last 3 Encounters:  07/25/16 (!) 142/92  07/22/16 138/90  07/18/15 112/70    General:  alert, well-developed, well-nourished, well-hydrated, and overweight-appearing.  NAD Head:  normocephalic and atraumatic.   Eyes:  vision grossly intact, pupils equal, pupils round, and pupils reactive to light.   Ears:  R ear normal and L ear normal.   Nose:  no external deformity.   Mouth:  good dentition.   Neck:  No deformities, masses, or tenderness noted. Lungs:  Normal respiratory effort, chest expands symmetrically. Lungs are clear to auscultation, no crackles or wheezes. Heart:  Normal rate and regular rhythm. S1 and S2 normal without gallop, murmur, click, rub or other extra sounds. Abdomen:  soft, non-tender, and normal bowel sounds.   Msk:  No deformity or scoliosis noted of thoracic or lumbar spine.   Extremities:  no edema Neurologic:  alert & oriented X3 and cranial nerves II-XII intact.   Skin:  Intact without suspicious lesions or rashes Psych:  normally interactive and moderately anxious.

## 2016-07-25 NOTE — Progress Notes (Signed)
Pre visit review using our clinic review tool, if applicable. No additional management support is needed unless otherwise documented below in the visit note. 

## 2016-08-02 ENCOUNTER — Encounter: Payer: Self-pay | Admitting: Podiatry

## 2016-08-02 ENCOUNTER — Ambulatory Visit (INDEPENDENT_AMBULATORY_CARE_PROVIDER_SITE_OTHER): Payer: Medicare Other | Admitting: Podiatry

## 2016-08-02 DIAGNOSIS — M79671 Pain in right foot: Secondary | ICD-10-CM | POA: Diagnosis not present

## 2016-08-02 DIAGNOSIS — M722 Plantar fascial fibromatosis: Secondary | ICD-10-CM

## 2016-08-02 DIAGNOSIS — M79672 Pain in left foot: Secondary | ICD-10-CM | POA: Diagnosis not present

## 2016-08-02 DIAGNOSIS — M79673 Pain in unspecified foot: Secondary | ICD-10-CM

## 2016-08-02 MED ORDER — BETAMETHASONE SOD PHOS & ACET 6 (3-3) MG/ML IJ SUSP: 3.0000 mg | Freq: Once | INTRAMUSCULAR | Status: DC

## 2016-08-02 NOTE — Patient Instructions (Signed)
Plantar Fasciitis Plantar fasciitis is a painful foot condition that affects the heel. It occurs when the band of tissue that connects the toes to the heel bone (plantar fascia) becomes irritated. This can happen after exercising too much or doing other repetitive activities (overuse injury). The pain from plantar fasciitis can range from mild irritation to severe pain that makes it difficult for you to walk or move. The pain is usually worse in the morning or after you have been sitting or lying down for a while. CAUSES This condition may be caused by:  Standing for long periods of time.  Wearing shoes that do not fit.  Doing high-impact activities, including running, aerobics, and ballet.  Being overweight.  Having an abnormal way of walking (gait).  Having tight calf muscles.  Having high arches in your feet.  Starting a new athletic activity. SYMPTOMS The main symptom of this condition is heel pain. Other symptoms include:  Pain that gets worse after activity or exercise.  Pain that is worse in the morning or after resting.  Pain that goes away after you walk for a few minutes. DIAGNOSIS This condition may be diagnosed based on your signs and symptoms. Your health care provider will also do a physical exam to check for:  A tender area on the bottom of your foot.  A high arch in your foot.  Pain when you move your foot.  Difficulty moving your foot. You may also need to have imaging studies to confirm the diagnosis. These can include:  X-rays.  Ultrasound.  MRI. TREATMENT  Treatment for plantar fasciitis depends on the severity of the condition. Your treatment may include:  Rest, ice, and over-the-counter pain medicines to manage your pain.  Exercises to stretch your calves and your plantar fascia.  A splint that holds your foot in a stretched, upward position while you sleep (night splint).  Physical therapy to relieve symptoms and prevent problems in the  future.  Cortisone injections to relieve severe pain.  Extracorporeal shock wave therapy (ESWT) to stimulate damaged plantar fascia with electrical impulses. It is often used as a last resort before surgery.  Surgery, if other treatments have not worked after 12 months. HOME CARE INSTRUCTIONS  Take medicines only as directed by your health care provider.  Avoid activities that cause pain.  Roll the bottom of your foot over a bag of ice or a bottle of cold water. Do this for 20 minutes, 3-4 times a day.  Perform simple stretches as directed by your health care provider.  Try wearing athletic shoes with air-sole or gel-sole cushions or soft shoe inserts.  Wear a night splint while sleeping, if directed by your health care provider.  Keep all follow-up appointments with your health care provider. PREVENTION   Do not perform exercises or activities that cause heel pain.  Consider finding low-impact activities if you continue to have problems.  Lose weight if you need to. The best way to prevent plantar fasciitis is to avoid the activities that aggravate your plantar fascia. SEEK MEDICAL CARE IF:  Your symptoms do not go away after treatment with home care measures.  Your pain gets worse.  Your pain affects your ability to move or do your daily activities.   This information is not intended to replace advice given to you by your health care provider. Make sure you discuss any questions you have with your health care provider.   Document Released: 07/09/2001 Document Revised: 07/05/2015 Document Reviewed: 08/24/2014 Elsevier   Interactive Patient Education 2016 Elsevier Inc.  

## 2016-08-02 NOTE — Progress Notes (Signed)
Subjective: Patient presents today for follow-up evaluation of left foot plantar fasciitis. Patient states that now she is beginning to get heel pain to the right foot. Patient does state that she notices improvement to her left heel. She does think that the injection did help. The patient went out and bought Vionic sandals which she states do help.  Objective: Physical Exam General: The patient is alert and oriented x3 in no acute distress.  Dermatology: Skin is warm, dry and supple bilateral lower extremities. Negative for open lesions or macerations bilateral.   Vascular: Dorsalis Pedis and Posterior Tibial pulses palpable bilateral.  Capillary fill time is immediate to all digits.  Neurological: Epicritic and protective threshold intact bilateral.   Musculoskeletal: Tenderness to palpation at the medial calcaneal tubercale and through the insertion of the plantar fascia of the bilateral feet. All other joints range of motion within normal limits bilateral. Strength 5/5 in all groups bilateral.    Assessment: #1 plantar fasciitis bilateral #2 pain in bilateral feet  Problem List Items Addressed This Visit    None    Visit Diagnoses   None.      Plan of Care:   1. Patient evaluated. Xrays reviewed.   2. Injection of 0.5cc Celestone soluspan injected into the left heel.   3. Instructed patient regarding therapies and modalities at home to alleviate symptoms.  4. Rx for anti-inflammatory pain cream through [place] given to patient.  5. Plantar fascial band(s) dispensed. 6. Return to clinic in 4 weeks.

## 2016-08-30 ENCOUNTER — Ambulatory Visit (INDEPENDENT_AMBULATORY_CARE_PROVIDER_SITE_OTHER): Payer: Medicare Other | Admitting: Podiatry

## 2016-08-30 DIAGNOSIS — M79672 Pain in left foot: Secondary | ICD-10-CM | POA: Diagnosis not present

## 2016-08-30 DIAGNOSIS — M79673 Pain in unspecified foot: Secondary | ICD-10-CM | POA: Diagnosis not present

## 2016-08-30 DIAGNOSIS — M722 Plantar fascial fibromatosis: Secondary | ICD-10-CM

## 2016-08-30 DIAGNOSIS — M79671 Pain in right foot: Secondary | ICD-10-CM

## 2016-08-31 MED ORDER — BETAMETHASONE SOD PHOS & ACET 6 (3-3) MG/ML IJ SUSP: 3.0000 mg | Freq: Once | INTRAMUSCULAR | Status: DC

## 2016-08-31 NOTE — Progress Notes (Signed)
Subjective: Patient presents today for pain and tenderness in the feet bilaterally. Patient states the foot pain has been hurting for several weeks now. Patient states that it hurts in the mornings with the first steps out of bed. Patient presents today for further treatment and evaluation  Objective: Physical Exam General: The patient is alert and oriented x3 in no acute distress.  Dermatology: Skin is warm, dry and supple bilateral lower extremities. Negative for open lesions or macerations bilateral.   Vascular: Dorsalis Pedis and Posterior Tibial pulses palpable bilateral.  Capillary fill time is immediate to all digits.  Neurological: Epicritic and protective threshold intact bilateral.   Musculoskeletal: Tenderness to palpation at the medial calcaneal tubercale and through the insertion of the plantar fascia of the bilateral feet. All other joints range of motion within normal limits bilateral. Strength 5/5 in all groups bilateral.   Radiographic exam: Normal osseous mineralization. Joint spaces preserved. No fracture/dislocation/boney destruction. Calcaneal spur present with mild thickening of plantar fascia bilateral. No other soft tissue abnormalities or radiopaque foreign bodies.   Assessment: #1 plantar fasciitis bilateral feet #2 pain in bilateral feet  Plan of Care:  1. Patient evaluated. Xrays reviewed.   2. Injection of 0.5cc Celestone soluspan injected into the bilateral heels.  3. Instructed patient regarding therapies and modalities at home to alleviate symptoms.  4. Continue anti-inflammatory cream, plantar fascial bands. 5. Return to clinic in 4 weeks.

## 2016-10-01 ENCOUNTER — Ambulatory Visit: Payer: Medicare Other | Admitting: Podiatry

## 2016-11-28 ENCOUNTER — Telehealth: Payer: Self-pay | Admitting: Family Medicine

## 2016-11-28 NOTE — Telephone Encounter (Signed)
Pt dropped off [employer] med change letter for review. She wants to know what you suggest. Please call home number. Letter dropped off is in the Rx tower and yours to keep. Pt has copy.

## 2016-12-24 NOTE — Telephone Encounter (Signed)
Pt went to p/u 3 mo meds but it was $128 due to tier change. She is requesting a change in prescription for lower tier/lower price. Pt is requesting a cb.

## 2017-01-03 MED ORDER — PANTOPRAZOLE SODIUM 40 MG PO TBEC: 40.0000 mg | DELAYED_RELEASE_TABLET | Freq: Every day | ORAL | 3 refills | Status: DC

## 2017-01-03 NOTE — Telephone Encounter (Signed)
Pt left v/m requesting cb about paperwork brought in 11/28/16.

## 2017-01-03 NOTE — Telephone Encounter (Signed)
Please apologize to pt.  We have had some staffing changes and I think this message was misplaced.  Can you see if this paperwork is on my desk?  Thank you.

## 2017-01-03 NOTE — Telephone Encounter (Signed)
Spoke to pt. She gave me the pharmacy info and it is the [facility]. [address]. I have sent in the Pantoprazole

## 2017-01-03 NOTE — Telephone Encounter (Signed)
Noted.  Thank you.  Let's try pantoprazole since it is a bit stronger and we are changing from a combo medication to one medication.  Attempted to send eRx sent to pharmacy requested but I cannot find [facility].  If you can find that pharmacy, please send in .Please keep [place] updated.

## 2017-01-03 NOTE — Telephone Encounter (Signed)
Spoke to pt. She said the omeprazole-bicarbonate 40mg  is moving from a tier 1 to a tier 3. It went from $9 to $40. The letter said they will cover plain omeprazole, esomeprazole, and pantoprazole on tier 1. Name Brand Nexium on tier 2. She wants to change to any of the tier 1 meds. Needs a 90 day supply to [facility].

## 2017-03-17 ENCOUNTER — Ambulatory Visit (INDEPENDENT_AMBULATORY_CARE_PROVIDER_SITE_OTHER)
Admission: RE | Admit: 2017-03-17 | Discharge: 2017-03-17 | Disposition: A | Discharge status: Home / Self Care | Payer: Medicare Other | Source: Ambulatory Visit | Attending: Family Medicine | Admitting: Family Medicine

## 2017-03-17 ENCOUNTER — Ambulatory Visit (INDEPENDENT_AMBULATORY_CARE_PROVIDER_SITE_OTHER): Payer: Medicare Other | Admitting: Family Medicine

## 2017-03-17 ENCOUNTER — Encounter: Payer: Self-pay | Admitting: Family Medicine

## 2017-03-17 DIAGNOSIS — S199XXA Unspecified injury of neck, initial encounter: Secondary | ICD-10-CM | POA: Diagnosis not present

## 2017-03-17 MED ORDER — AMOXICILLIN-POT CLAVULANATE 875-125 MG PO TABS: 1.0000 | ORAL_TABLET | Freq: Two times a day (BID) | ORAL | 0 refills | Status: AC

## 2017-03-17 NOTE — Progress Notes (Signed)
SUBJECTIVE:  Leah Melendez is a 71 y.o. female who complains of an injury causing neck pain 5 day(s) ago. The pain is positional with movement of neck without radiation of pain down the arms. Mechanism of injury: feel on her back and tailbone and felt her neck "snap back" and she hit her head.  No LOC.  Symptoms have been insidious since that time. Prior history of neck problems: no prior neck problems. There is no numbness, tingling, weakness in the arms.  Current Outpatient Prescriptions on File Prior to Visit  Medication Sig Dispense Refill  . Ascorbic Acid (VITAMIN C) 500 MG tablet Take 500 mg by mouth daily.      Marland Kitchen aspirin EC 81 MG tablet Take 81 mg by mouth daily.    . Cholecalciferol ([facility] HIGH POTENCY VITAMIN D) 1000 UNITS tablet Take 1,000 Units by mouth daily.      . fish oil-omega-3 fatty acids 1000 MG capsule Take one capsule by mouth four times a day     . FLUAD 0.5 ML SUSY     . loratadine (CLARITIN) 10 MG tablet Take 10 mg by mouth daily as needed.     . NONFORMULARY OR COMPOUNDED ITEM Achilles Tendonitis Cream-[facility] 2 refills    . omeprazole-sodium bicarbonate (ZEGERID) 40-1100 MG capsule take 1 capsule by mouth at bedtime 90 capsule 2  . pantoprazole (PROTONIX) 40 MG tablet Take 1 tablet (40 mg total) by mouth daily. 90 tablet 3  . ranitidine (ZANTAC) 150 MG tablet Take 1 tablet (150 mg total) by mouth 2 (two) times daily. (Patient taking differently: Take 150 mg by mouth daily as needed. ) 60 tablet 0  . simvastatin (ZOCOR) 40 MG tablet Take 1 tablet (40 mg total) by mouth daily. 90 tablet 2  . sulindac (CLINORIL) 200 MG tablet Take one tab twice a day for 2 weeks then as needed 60 tablet 2   Current Facility-Administered Medications on File Prior to Visit  Medication Dose Route Frequency Provider Last Rate Last Dose  . betamethasone acetate-betamethasone sodium phosphate (CELESTONE) injection 12 mg  12 mg Intramuscular Once Daylene Katayama M, DPM      .  betamethasone acetate-betamethasone sodium phosphate (CELESTONE) injection 3 mg  3 mg Intramuscular Once Daylene Katayama M, DPM      . betamethasone acetate-betamethasone sodium phosphate (CELESTONE) injection 3 mg  3 mg Intramuscular Once Daylene Katayama M, DPM      . triamcinolone acetonide (KENALOG) 10 MG/ML injection 10 mg  10 mg Other Once Landis Martins, DPM        Allergies  Allergen Reactions  . Dexilant [Dexlansoprazole] Diarrhea  . Sulfonamide Derivatives     REACTION: as child    Past Medical History:  Diagnosis Date  . Fatty liver 2010  . GERD (gastroesophageal reflux disease)   . H. pylori infection   . Hemangioma 2010  . Hiatal hernia   . Hyperlipidemia     Past Surgical History:  Procedure Laterality Date  . CHOLECYSTECTOMY    . KNEE SURGERY  06/29/2007   Meniscus tear, left knee surgery    Family History  Problem Relation Age of Onset  . Colon cancer Neg Hx     Social History   Social History  . Marital status: Married    Spouse name: N/A  . Number of children: 2  . Years of education: N/A   Occupational History  . Retired Garment/textile technologist    Social History Main Topics  .  Smoking status: Never Smoker  . Smokeless tobacco: Never Used  . Alcohol use No  . Drug use: No  . Sexual activity: Yes   Other Topics Concern  . Not on file   Social History Narrative   Has living will.  Husband, [name] is HPOA.  Daughters have a copy of living will and [name] would be HPOA if husband was not able.      Would desire CPR.  She would not want heroic measures.               The PMH, PSH, Social History, Family History, Medications, and allergies have been reviewed in [facility], and have been updated if relevant.  OBJECTIVE: BP 122/80   Pulse (!) 121   Temp 97.7 F (36.5 C)   Ht 5' 1.25" (1.556 m)   Wt 181 lb (82.1 kg)   SpO2 98%   BMI 33.92 kg/m   Vital signs as noted above. Patient appears to be in mild to moderate  pain.  Neck exam: tenderness over trapezial muscles, reduced painful C-spine range of motion. X-Ray: ordered, but results not yet available.  ASSESSMENT:  contusion  PLAN: rest the injured area as much as practical Consider Physical Therapy and XRay studies if not improving.  Call or return to clinic prn if these symptoms worsen or fail to improve as anticipated.

## 2017-03-17 NOTE — Progress Notes (Signed)
Pre visit review using our clinic review tool, if applicable. No additional management support is needed unless otherwise documented below in the visit note. 

## 2017-04-17 ENCOUNTER — Other Ambulatory Visit: Payer: Self-pay | Admitting: Family Medicine

## 2017-07-28 ENCOUNTER — Ambulatory Visit: Payer: Medicare Other

## 2017-07-29 ENCOUNTER — Ambulatory Visit (INDEPENDENT_AMBULATORY_CARE_PROVIDER_SITE_OTHER): Payer: Medicare Other

## 2017-07-29 VITALS — BP 136/84 | HR 77 | Temp 97.7°F | Ht 61.0 in | Wt 180.5 lb

## 2017-07-29 DIAGNOSIS — M949 Disorder of cartilage, unspecified: Secondary | ICD-10-CM

## 2017-07-29 DIAGNOSIS — M899 Disorder of bone, unspecified: Secondary | ICD-10-CM

## 2017-07-29 DIAGNOSIS — Z0000 Encounter for general adult medical examination without abnormal findings: Secondary | ICD-10-CM

## 2017-07-29 DIAGNOSIS — E782 Mixed hyperlipidemia: Secondary | ICD-10-CM

## 2017-07-29 LAB — CBC WITH DIFFERENTIAL/PLATELET
BASOS ABS: 0 10*3/uL (ref 0.0–0.1)
Basophils Relative: 0.3 % (ref 0.0–3.0)
EOS ABS: 0.1 10*3/uL (ref 0.0–0.7)
Eosinophils Relative: 1 % (ref 0.0–5.0)
HCT: 40.2 % (ref 36.0–46.0)
Hemoglobin: 13.4 g/dL (ref 12.0–15.0)
LYMPHS ABS: 1.6 10*3/uL (ref 0.7–4.0)
Lymphocytes Relative: 28.1 % (ref 12.0–46.0)
MCHC: 33.4 g/dL (ref 30.0–36.0)
MCV: 93.7 fl (ref 78.0–100.0)
MONO ABS: 0.4 10*3/uL (ref 0.1–1.0)
MONOS PCT: 6.9 % (ref 3.0–12.0)
NEUTROS ABS: 3.6 10*3/uL (ref 1.4–7.7)
NEUTROS PCT: 63.7 % (ref 43.0–77.0)
PLATELETS: 176 10*3/uL (ref 150.0–400.0)
RBC: 4.29 Mil/uL (ref 3.87–5.11)
RDW: 12.2 % (ref 11.5–15.5)
WBC: 5.6 10*3/uL (ref 4.0–10.5)

## 2017-07-29 LAB — COMPREHENSIVE METABOLIC PANEL
ALK PHOS: 59 U/L (ref 39–117)
ALT: 9 U/L (ref 0–35)
AST: 13 U/L (ref 0–37)
Albumin: 4.1 g/dL (ref 3.5–5.2)
BILIRUBIN TOTAL: 1.3 mg/dL — AB (ref 0.2–1.2)
BUN: 12 mg/dL (ref 6–23)
CO2: 29 meq/L (ref 19–32)
CREATININE: 0.73 mg/dL (ref 0.40–1.20)
Calcium: 9.5 mg/dL (ref 8.4–10.5)
Chloride: 104 mEq/L (ref 96–112)
GFR: 83.09 mL/min (ref >60.00)
GLUCOSE: 90 mg/dL (ref 70–99)
Potassium: 4.4 mEq/L (ref 3.5–5.1)
Sodium: 139 mEq/L (ref 135–145)
TOTAL PROTEIN: 7.3 g/dL (ref 6.0–8.3)

## 2017-07-29 LAB — TSH: TSH: 2.43 u[IU]/mL (ref 0.35–4.50)

## 2017-07-29 LAB — LIPID PANEL
Cholesterol: 164 mg/dL (ref 0–200)
HDL: 48.3 mg/dL (ref >39.00)
LDL Cholesterol: 98 mg/dL (ref 0–99)
NONHDL: 116.14
TRIGLYCERIDES: 90 mg/dL (ref 0.0–149.0)
Total CHOL/HDL Ratio: 3
VLDL: 18 mg/dL (ref 0.0–40.0)

## 2017-07-29 LAB — VITAMIN D 25 HYDROXY (VIT D DEFICIENCY, FRACTURES): VITD: 21.9 ng/mL — AB (ref 30.00–100.00)

## 2017-07-29 NOTE — Progress Notes (Signed)
Pre visit review using our clinic review tool, if applicable. No additional management support is needed unless otherwise documented below in the visit note. 

## 2017-07-29 NOTE — Progress Notes (Signed)
PCP notes:   Health maintenance:  Flu vaccine - addressed; pt plans to obtain vaccine at pharmacy Mammogram - per pt, exam in Aug 2018 Bone density - per pt, screening in Aug 2018  Abnormal screenings:   Fall risk - hx of fall with injury  Patient concerns:   Shingrix - PCP please discuss with patient at next appt  Nurse concerns:  None  Next PCP appt:   07/31/17 @ 0915

## 2017-07-29 NOTE — Patient Instructions (Signed)
Leah Melendez , Thank you for taking time to come for your Medicare Wellness Visit. I appreciate your ongoing commitment to your health goals. Please review the following plan we discussed and let me know if I can assist you in the future.   These are the goals we discussed: Goals    . Increase physical activity          Starting 07/22/2016, I will continue to walk and stretch for 30 min 2-3 days per week.        This is a list of the screening recommended for you and due dates:  Health Maintenance  Topic Date Due  . Flu Shot  01/25/2018*  . Tetanus Vaccine  11/30/2018  . Mammogram  06/18/2019  . Colon Cancer Screening  07/24/2021  . DEXA scan (bone density measurement)  Completed  .  Hepatitis C: One time screening is recommended by Center for Disease Control  (CDC) for  adults born from [birth year] through 1967.   Completed  . Pneumonia vaccines  Completed  *Topic was postponed. The date shown is not the original due date.   Preventive Care for Adults  A healthy lifestyle and preventive care can promote health and wellness. Preventive health guidelines for adults include the following key practices.  . A routine yearly physical is a good way to check with your health care provider about your health and preventive screening. It is a chance to share any concerns and updates on your health and to receive a thorough exam.  . Visit your dentist for a routine exam and preventive care every 6 months. Brush your teeth twice a day and floss once a day. Good oral hygiene prevents tooth decay and gum disease.  . The frequency of eye exams is based on your age, health, family medical history, use  of contact lenses, and other factors. Follow your health care provider's ecommendations for frequency of eye exams.  . Eat a healthy diet. Foods like vegetables, fruits, whole grains, low-fat dairy products, and lean protein foods contain the nutrients you need without too many calories. Decrease your  intake of foods high in solid fats, added sugars, and salt. Eat the right amount of calories for you. Get information about a proper diet from your health care provider, if necessary.  . Regular physical exercise is one of the most important things you can do for your health. Most adults should get at least 150 minutes of moderate-intensity exercise (any activity that increases your heart rate and causes you to sweat) each week. In addition, most adults need muscle-strengthening exercises on 2 or more days a week.  Silver Sneakers may be a benefit available to you. To determine eligibility, you may visit the website: www.silversneakers.com or contact program at 804-655-0650 Mon-Fri between 8AM-8PM.   . Maintain a healthy weight. The body mass index (BMI) is a screening tool to identify possible weight problems. It provides an estimate of body fat based on height and weight. Your health care provider can find your BMI and can help you achieve or maintain a healthy weight.   For adults 20 years and older: ? A BMI below 18.5 is considered underweight. ? A BMI of 18.5 to 24.9 is normal. ? A BMI of 25 to 29.9 is considered overweight. ? A BMI of 30 and above is considered obese.   . Maintain normal blood lipids and cholesterol levels by exercising and minimizing your intake of saturated fat. Eat a balanced diet with plenty of  fruit and vegetables. Blood tests for lipids and cholesterol should begin at age 2 and be repeated every 5 years. If your lipid or cholesterol levels are high, you are over 50, or you are at high risk for heart disease, you may need your cholesterol levels checked more frequently. Ongoing high lipid and cholesterol levels should be treated with medicines if diet and exercise are not working.  . If you smoke, find out from your health care provider how to quit. If you do not use tobacco, please do not start.  . If you choose to drink alcohol, please do not consume more than 2  drinks per day. One drink is considered to be 12 ounces (355 mL) of beer, 5 ounces (148 mL) of wine, or 1.5 ounces (44 mL) of liquor.  . If you are 61-66 years old, ask your health care provider if you should take aspirin to prevent strokes.  . Use sunscreen. Apply sunscreen liberally and repeatedly throughout the day. You should seek shade when your shadow is shorter than you. Protect yourself by wearing long sleeves, pants, a wide-brimmed hat, and sunglasses year round, whenever you are outdoors.  . Once a month, do a whole body skin exam, using a mirror to look at the skin on your back. Tell your health care provider of new moles, moles that have irregular borders, moles that are larger than a pencil eraser, or moles that have changed in shape or color.

## 2017-07-29 NOTE — Progress Notes (Signed)
Subjective:   Leah Melendez is a 71 y.o. female who presents for Medicare Annual (Subsequent) preventive examination.  Review of Systems:  N/A Cardiac Risk Factors include: advanced age (>41men, >38 women);obesity (BMI >30kg/m2);dyslipidemia     Objective:     Vitals: BP 136/84 (BP Location: Right Arm, Patient Position: Sitting, Cuff Size: Normal)   Pulse 77   Temp 97.7 F (36.5 C) (Oral)   Ht 5\' 1"  (1.549 m) Comment: no shoes  Wt 180 lb 8 oz (81.9 kg)   SpO2 94%   BMI 34.11 kg/m   Body mass index is 34.11 kg/m.   Tobacco History  Smoking Status  . Never Smoker  Smokeless Tobacco  . Never Used     Counseling given: No   Past Medical History:  Diagnosis Date  . Fatty liver 2010  . GERD (gastroesophageal reflux disease)   . H. pylori infection   . Hemangioma 2010  . Hiatal hernia   . Hyperlipidemia    Past Surgical History:  Procedure Laterality Date  . CHOLECYSTECTOMY    . KNEE SURGERY  06/29/2007   Meniscus tear, left knee surgery   Family History  Problem Relation Age of Onset  . Colon cancer Neg Hx    History  Sexual Activity  . Sexual activity: Yes    Outpatient Encounter Prescriptions as of 07/29/2017  Medication Sig  . Ascorbic Acid (VITAMIN C) 500 MG tablet Take 500 mg by mouth daily.    Marland Kitchen aspirin EC 81 MG tablet Take 81 mg by mouth daily.  . Calcium Carbonate-Vitamin D (CALCIUM 600/VITAMIN D PO) Take 1 tablet by mouth 2 (two) times daily.  . Cholecalciferol ([facility] HIGH POTENCY VITAMIN D) 1000 UNITS tablet Take 1,000 Units by mouth daily.    . fish oil-omega-3 fatty acids 1000 MG capsule Take 1 capsule by mouth 2 (two) times daily. Take one capsule by mouth four times a day   . FLUAD 0.5 ML SUSY   . loratadine (CLARITIN) 10 MG tablet Take 10 mg by mouth daily as needed.   . NONFORMULARY OR COMPOUNDED ITEM Achilles Tendonitis Cream[facility] 2 refills  . pantoprazole (PROTONIX) 40 MG tablet Take 1 tablet (40 mg total) by mouth  daily.  . ranitidine (ZANTAC) 150 MG tablet Take 1 tablet (150 mg total) by mouth 2 (two) times daily. (Patient taking differently: Take 150 mg by mouth daily as needed. )  . simvastatin (ZOCOR) 40 MG tablet take 1 tablet by mouth once daily  . sulindac (CLINORIL) 200 MG tablet Take one tab twice a day for 2 weeks then as needed  . VITAMIN E PO Take 1 capsule by mouth daily.  . [DISCONTINUED] omeprazole-sodium bicarbonate (ZEGERID) 40-1100 MG capsule take 1 capsule by mouth at bedtime   Facility-Administered Encounter Medications as of 07/29/2017  Medication  . betamethasone acetate-betamethasone sodium phosphate (CELESTONE) injection 12 mg  . betamethasone acetate-betamethasone sodium phosphate (CELESTONE) injection 3 mg  . betamethasone acetate-betamethasone sodium phosphate (CELESTONE) injection 3 mg  . triamcinolone acetonide (KENALOG) 10 MG/ML injection 10 mg    Activities of Daily Living In your present state of health, do you have any difficulty performing the following activities: 07/29/2017  Hearing? N  Vision? N  Difficulty concentrating or making decisions? N  Walking or climbing stairs? N  Dressing or bathing? N  Doing errands, shopping? N  Preparing Food and eating ? N  Using the Toilet? N  In the past six months, have you accidently leaked urine?  N  Do you have problems with loss of bowel control? N  Managing your Medications? N  Managing your Finances? N  Housekeeping or managing your Housekeeping? N  Some recent data might be hidden    Patient Care Team: Lucille Passy, MD as PCP - General (Family Medicine) Johnney Ou., MD as Consulting Physician (Internal Medicine) Inda Castle, MD as Consulting Physician (Gastroenterology)    Assessment:     Hearing Screening   125Hz  250Hz  500Hz  1000Hz  2000Hz  3000Hz  4000Hz  6000Hz  8000Hz   Right ear:   40 40 40  40    Left ear:   40 40 40  40    Vision Screening Comments: Last vision exam in December  2017  Exercise Activities and Dietary recommendations Current Exercise Habits: The patient does not participate in regular exercise at present, Exercise limited by: None identified  Goals    . Increase physical activity          Starting 07/29/2017, I will attempt to walk and stretch for 30 min 2-3 days per week.       Fall Risk Fall Risk  07/29/2017 07/22/2016 07/18/2015 07/14/2014 01/13/2013  Falls in the past year? Yes No No No No  Number falls in past yr: 1 - - - -  Injury with Fall? Yes - - - -   Depression Screen PHQ 2/9 Scores 07/29/2017 07/22/2016 07/18/2015 07/14/2014  PHQ - 2 Score 0 0 0 0  PHQ- 9 Score 0 - - -     Cognitive Function MMSE - Mini Mental State Exam 07/29/2017 07/22/2016  Orientation to time 5 5  Orientation to Place 5 5  Registration 3 3  Attention/ Calculation 0 0  Recall 3 3  Language- name 2 objects 0 0  Language- repeat 1 1  Language- follow 3 step command 3 3  Language- read & follow direction 0 0  Write a sentence 0 0  Copy design 0 0  Total score 20 20       PLEASE NOTE: A Mini-Cog screen was completed. Maximum score is 20. A value of 0 denotes this part of Folstein MMSE was not completed or the patient failed this part of the Mini-Cog screening.   Mini-Cog Screening Orientation to Time - Max 5 pts Orientation to Place - Max 5 pts Registration - Max 3 pts Recall - Max 3 pts Language Repeat - Max 1 pts Language Follow 3 Step Command - Max 3 pts   Immunization History  Administered Date(s) Administered  . Influenza Split 07/10/2011  . Influenza Whole 07/27/2010, 07/18/2012  . Influenza,inj,Quad PF,6+ Mos 07/14/2014  . Influenza-Unspecified 06/28/2013, 06/19/2015  . Pneumococcal Conjugate-13 04/05/2014  . Pneumococcal Polysaccharide-23 01/13/2013  . Td 11/30/2008  . Zoster 11/30/2008   Screening Tests Health Maintenance  Topic Date Due  . INFLUENZA VACCINE  01/25/2018 (Originally 05/28/2017)  . TETANUS/TDAP  11/30/2018  . MAMMOGRAM   06/18/2019  . COLONOSCOPY  07/24/2021  . DEXA SCAN  Completed  . Hepatitis C Screening  Completed  . PNA vac Low Risk Adult  Completed      Plan:     I have personally reviewed and addressed the Medicare Annual Wellness questionnaire and have noted the following in the patient's chart:  A. Medical and social history B. Use of alcohol, tobacco or illicit drugs  C. Current medications and supplements D. Functional ability and status E.  Nutritional status F.  Physical activity G. Advance directives H. List of other physicians I.  Hospitalizations, surgeries, and ER visits in previous 12 months J.  [place] to include hearing, vision, cognitive, depression L. Referrals and appointments - none  In addition, I have reviewed and discussed with patient certain preventive protocols, quality metrics, and best practice recommendations. A written personalized care plan for preventive services as well as general preventive health recommendations were provided to patient.  See attached scanned questionnaire for additional information.   Signed,   Lindell Noe, MHA, BS, LPN Health Coach

## 2017-07-30 NOTE — Progress Notes (Signed)
I reviewed health advisor's note, was available for consultation, and agree with documentation and plan.  

## 2017-07-31 ENCOUNTER — Ambulatory Visit (INDEPENDENT_AMBULATORY_CARE_PROVIDER_SITE_OTHER): Payer: Medicare Other | Admitting: Family Medicine

## 2017-07-31 VITALS — BP 140/86 | HR 69 | Temp 98.0°F | Ht 61.0 in | Wt 182.0 lb

## 2017-07-31 DIAGNOSIS — Z01419 Encounter for gynecological examination (general) (routine) without abnormal findings: Secondary | ICD-10-CM

## 2017-07-31 DIAGNOSIS — K219 Gastro-esophageal reflux disease without esophagitis: Secondary | ICD-10-CM

## 2017-07-31 DIAGNOSIS — Z0000 Encounter for general adult medical examination without abnormal findings: Secondary | ICD-10-CM

## 2017-07-31 DIAGNOSIS — E782 Mixed hyperlipidemia: Secondary | ICD-10-CM | POA: Diagnosis not present

## 2017-07-31 MED ORDER — RANITIDINE HCL 150 MG PO TABS: 150.0000 mg | ORAL_TABLET | Freq: Two times a day (BID) | ORAL | 3 refills | Status: DC

## 2017-07-31 NOTE — Assessment & Plan Note (Signed)
Well controlled.  No changes made. 

## 2017-07-31 NOTE — Progress Notes (Signed)
Very pleasant 71 yo female here for CPX and follow up of chronic medical conditions.  Annual medicare wellness visit with Candis Musa, RN on 07/29/17. Note reviewed.  Health Maintenance  Topic Date Due  . INFLUENZA VACCINE  01/25/2018 (Originally 05/28/2017)  . TETANUS/TDAP  11/30/2018  . MAMMOGRAM  06/18/2019  . COLONOSCOPY  07/24/2021  . DEXA SCAN  Completed  . Hepatitis C Screening  Completed  . PNA vac Low Risk Adult  Completed    HLD- well controlled on current dose of zocor.  Denies myalgias. Lab Results  Component Value Date   CHOL 164 07/29/2017   HDL 48.30 07/29/2017   LDLCALC 98 07/29/2017   TRIG 90.0 07/29/2017   CHOLHDL 3 07/29/2017   Lab Results  Component Value Date   CREATININE 0.73 07/29/2017   Lab Results  Component Value Date   TSH 2.43 07/29/2017   Lab Results  Component Value Date   WBC 5.6 07/29/2017   HGB 13.4 07/29/2017   HCT 40.2 07/29/2017   MCV 93.7 07/29/2017   PLT 176.0 07/29/2017   GERD- has been well controlled with daily protonix with as needed zantac.  Has OBGYN, Dr. Matthew Saras.  Just saw him- pap smear, mammogram and dexa.   Patient Active Problem List   Diagnosis Date Noted  . Neck injury, initial encounter 03/17/2017  . Well woman exam 07/25/2016  . 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 HIATAL HERNIA 11/30/2008  . Disorder of bone and cartilage 11/30/2008  . GERD 06/01/2008  . HYPERLIPIDEMIA, MIXED 03/19/2007   Past Medical History:  Diagnosis Date  . Fatty liver 2010  . GERD (gastroesophageal reflux disease)   . H. pylori infection   . Hemangioma 2010  . Hiatal hernia   . Hyperlipidemia    Past Surgical History:  Procedure Laterality Date  . CHOLECYSTECTOMY    . KNEE SURGERY  06/29/2007   Meniscus tear, left knee surgery   Social History  Substance Use Topics  . Smoking status: Never Smoker  . Smokeless tobacco: Never Used  . Alcohol use No   Family History  Problem Relation Age of Onset   . Colon cancer Neg Hx    Allergies  Allergen Reactions  . Dexilant [Dexlansoprazole] Diarrhea  . Sulfonamide Derivatives     REACTION: as child   Current Outpatient Prescriptions on File Prior to Visit  Medication Sig Dispense Refill  . Ascorbic Acid (VITAMIN C) 500 MG tablet Take 500 mg by mouth daily.      Marland Kitchen aspirin EC 81 MG tablet Take 81 mg by mouth daily.    . Calcium Carbonate-Vitamin D (CALCIUM 600/VITAMIN D PO) Take 1 tablet by mouth 2 (two) times daily.    . Cholecalciferol ([facility] HIGH POTENCY VITAMIN D) 1000 UNITS tablet Take 1,000 Units by mouth daily.      . fish oil-omega-3 fatty acids 1000 MG capsule Take 1 capsule by mouth 2 (two) times daily. Take one capsule by mouth four times a day     . FLUAD 0.5 ML SUSY     . loratadine (CLARITIN) 10 MG tablet Take 10 mg by mouth daily as needed.     . NONFORMULARY OR COMPOUNDED ITEM Achilles Tendonitis Cream[facility] 2 refills    . pantoprazole (PROTONIX) 40 MG tablet Take 1 tablet (40 mg total) by mouth daily. 90 tablet 3  . ranitidine (ZANTAC) 150 MG tablet Take 1 tablet (150 mg total) by mouth 2 (two) times daily. (Patient taking differently: Take 150  mg by mouth daily as needed. ) 60 tablet 0  . simvastatin (ZOCOR) 40 MG tablet take 1 tablet by mouth once daily 90 tablet 1  . sulindac (CLINORIL) 200 MG tablet Take one tab twice a day for 2 weeks then as needed 60 tablet 2  . VITAMIN E PO Take 1 capsule by mouth daily.     Current Facility-Administered Medications on File Prior to Visit  Medication Dose Route Frequency Provider Last Rate Last Dose  . betamethasone acetate-betamethasone sodium phosphate (CELESTONE) injection 12 mg  12 mg Intramuscular Once Daylene Katayama M, DPM      . betamethasone acetate-betamethasone sodium phosphate (CELESTONE) injection 3 mg  3 mg Intramuscular Once Daylene Katayama M, DPM      . betamethasone acetate-betamethasone sodium phosphate (CELESTONE) injection 3 mg  3 mg Intramuscular Once Daylene Katayama M, DPM      . triamcinolone acetonide (KENALOG) 10 MG/ML injection 10 mg  10 mg Other Once Landis Martins, DPM       The PMH, PSH, Social History, Family History, Medications, and allergies have been reviewed in [facility], and have been updated if relevant.  Review of Systems  Constitutional: Negative.   HENT: Negative.   Eyes: Negative.   Respiratory: Negative.   Cardiovascular: Negative.   Gastrointestinal: Negative.   Endocrine: Negative.   Genitourinary: Negative.   Musculoskeletal: Negative for arthralgias, back pain, gait problem, joint swelling, myalgias, neck pain and neck stiffness.  Skin: Negative.   Allergic/Immunologic: Negative.   Neurological: Negative.   Hematological: Negative.   Psychiatric/Behavioral: Negative.     Physical exam: BP 140/86 (BP Location: Right Arm, Patient Position: Sitting, Cuff Size: Normal)   Pulse 69   Temp 98 F (36.7 C) (Oral)   Ht 5\' 1"  (1.549 m)   Wt 182 lb (82.6 kg)   SpO2 98%   BMI 34.39 kg/m   Wt Readings from Last 3 Encounters:  07/31/17 182 lb (82.6 kg)  07/29/17 180 lb 8 oz (81.9 kg)  03/17/17 181 lb (82.1 kg)   BP Readings from Last 3 Encounters:  07/31/17 140/86  07/29/17 136/84  03/17/17 122/80    General:  Well-developed,well-nourished,in no acute distress; alert,appropriate and cooperative throughout examination Head:  normocephalic and atraumatic.   Eyes:  vision grossly intact, PERRL Ears:  R ear normal and L ear normal externally, TMs clear bilaterally Nose:  no external deformity.   Mouth:  good dentition.   Neck:  No deformities, masses, or tenderness noted. Breasts:  No mass, nodules, thickening, tenderness, bulging, retraction, inflamation, nipple discharge or skin changes noted.   Lungs:  Normal respiratory effort, chest expands symmetrically. Lungs are clear to auscultation, no crackles or wheezes. Heart:  Normal rate and regular rhythm. S1 and S2 normal without gallop, murmur, click, rub or other  extra sounds. Abdomen:  Bowel sounds positive,abdomen soft and non-tender without masses, organomegaly or hernias noted. Msk:  No deformity or scoliosis noted of thoracic or lumbar spine.   Extremities:  No clubbing, cyanosis, edema, or deformity noted with normal full range of motion of all joints.   Neurologic:  alert & oriented X3 and gait normal.   Skin:  Intact without suspicious lesions or rashes Cervical Nodes:  No lymphadenopathy noted Axillary Nodes:  No palpable lymphadenopathy Psych:  Cognition and judgment appear intact. Alert and cooperative with normal attention span and concentration. No apparent delusions, illusions, hallucinations

## 2017-07-31 NOTE — Assessment & Plan Note (Signed)
Well controlled 

## 2017-07-31 NOTE — Assessment & Plan Note (Signed)
Reviewed preventive care protocols, scheduled due services, and updated immunizations Discussed nutrition, exercise, diet, and healthy lifestyle.  

## 2017-08-11 ENCOUNTER — Encounter: Payer: Self-pay | Admitting: Family Medicine

## 2017-08-11 ENCOUNTER — Ambulatory Visit (INDEPENDENT_AMBULATORY_CARE_PROVIDER_SITE_OTHER): Payer: Medicare Other | Admitting: Family Medicine

## 2017-08-11 VITALS — BP 138/88 | HR 105 | Temp 97.7°F | Ht 61.0 in | Wt 183.8 lb

## 2017-08-11 DIAGNOSIS — M65332 Trigger finger, left middle finger: Secondary | ICD-10-CM | POA: Diagnosis not present

## 2017-08-11 MED ORDER — METHYLPREDNISOLONE ACETATE 40 MG/ML IJ SUSP
20.0000 mg | Freq: Once | INTRAMUSCULAR | Status: AC
  Administered 2017-08-11: 20 mg via INTRA_ARTICULAR

## 2017-08-11 NOTE — Progress Notes (Signed)
   Dr. Frederico Hamman T. Junelle Hashemi, MD, [facility] [place], [address] Phone: [phone] Fax: [phone]  08/11/2017  Patient: Leah Melendez, MRN: [id number], DOB: December 08, 1945, 71 y.o.  Primary Physician:  Lucille Passy, MD   Chief Complaint  Patient presents with  . Trigger Finger    Left Middle Finger   Subjective:   Trigger Finger Injection, L 3rd Verbal consent was obtained. Risks (including rare risk of infection, potential risk for skin lightening and potential atrophy), benefits and alternatives were discussed. Prepped with Chloraprep and Ethyl Chloride used for anesthesia. Under sterile conditions, patient injected at palmar crease aiming distally with 45 degree angle towards nodule; injected directly into tendon sheath. Medication flowed freely without resistance.  Needle size: 22 gauge 1 1/2 inch Injection: 1/2 cc of Lidocaine 1% and Depo-Medrol 20 mg   Other occ hand OA issues and pain, too.  Signed,  Maud Deed. Chantrice Hagg, MD

## 2017-09-26 ENCOUNTER — Other Ambulatory Visit: Payer: Self-pay | Admitting: Family Medicine

## 2018-06-26 ENCOUNTER — Other Ambulatory Visit: Payer: Self-pay | Admitting: Family Medicine

## 2018-08-05 ENCOUNTER — Ambulatory Visit: Payer: Medicare Other | Admitting: Behavioral Health

## 2018-08-05 ENCOUNTER — Other Ambulatory Visit: Payer: Medicare Other

## 2018-08-11 ENCOUNTER — Telehealth: Payer: Self-pay

## 2018-08-11 NOTE — Telephone Encounter (Signed)
PEC-I left a message asking if patient would like to also be put on the schedule to see our wellness coach at [place] on 10.16.19 (schedule for "Leah Melendez) then we can get her fasting labs and she could go get something to eat then come back for the 10am visit with Dr. Deborra Medina?  If she is ok with this please schedule her accordingly/thx dmf

## 2018-08-12 ENCOUNTER — Ambulatory Visit (INDEPENDENT_AMBULATORY_CARE_PROVIDER_SITE_OTHER): Payer: Medicare Other | Admitting: Behavioral Health

## 2018-08-12 ENCOUNTER — Encounter: Payer: Self-pay | Admitting: Family Medicine

## 2018-08-12 ENCOUNTER — Telehealth: Payer: Self-pay | Admitting: Family Medicine

## 2018-08-12 ENCOUNTER — Ambulatory Visit (INDEPENDENT_AMBULATORY_CARE_PROVIDER_SITE_OTHER): Payer: Medicare Other | Admitting: Family Medicine

## 2018-08-12 ENCOUNTER — Encounter: Payer: Self-pay | Admitting: Behavioral Health

## 2018-08-12 VITALS — BP 138/78 | HR 78 | Temp 97.8°F | Ht 61.0 in | Wt 169.0 lb

## 2018-08-12 VITALS — BP 138/78 | HR 78 | Ht 61.0 in | Wt 169.0 lb

## 2018-08-12 DIAGNOSIS — M899 Disorder of bone, unspecified: Secondary | ICD-10-CM | POA: Diagnosis not present

## 2018-08-12 DIAGNOSIS — F4321 Adjustment disorder with depressed mood: Secondary | ICD-10-CM | POA: Diagnosis not present

## 2018-08-12 DIAGNOSIS — Z0000 Encounter for general adult medical examination without abnormal findings: Secondary | ICD-10-CM | POA: Diagnosis not present

## 2018-08-12 DIAGNOSIS — K219 Gastro-esophageal reflux disease without esophagitis: Secondary | ICD-10-CM

## 2018-08-12 DIAGNOSIS — E782 Mixed hyperlipidemia: Secondary | ICD-10-CM

## 2018-08-12 DIAGNOSIS — M949 Disorder of cartilage, unspecified: Secondary | ICD-10-CM

## 2018-08-12 DIAGNOSIS — Z1211 Encounter for screening for malignant neoplasm of colon: Secondary | ICD-10-CM

## 2018-08-12 DIAGNOSIS — E559 Vitamin D deficiency, unspecified: Secondary | ICD-10-CM

## 2018-08-12 LAB — COMPREHENSIVE METABOLIC PANEL
ALBUMIN: 3.9 g/dL (ref 3.5–5.2)
ALT: 10 U/L (ref 0–35)
AST: 10 U/L (ref 0–37)
Alkaline Phosphatase: 58 U/L (ref 39–117)
BUN: 16 mg/dL (ref 6–23)
CALCIUM: 9.4 mg/dL (ref 8.4–10.5)
CHLORIDE: 109 meq/L (ref 96–112)
CO2: 26 mEq/L (ref 19–32)
Creatinine, Ser: 0.79 mg/dL (ref 0.40–1.20)
GFR: 75.63 mL/min (ref >60.00)
Glucose, Bld: 99 mg/dL (ref 70–99)
POTASSIUM: 4.5 meq/L (ref 3.5–5.1)
SODIUM: 141 meq/L (ref 135–145)
Total Bilirubin: 1.2 mg/dL (ref 0.2–1.2)
Total Protein: 6.9 g/dL (ref 6.0–8.3)

## 2018-08-12 LAB — CBC
HEMATOCRIT: 39.6 % (ref 36.0–46.0)
HEMOGLOBIN: 13.5 g/dL (ref 12.0–15.0)
MCHC: 34.2 g/dL (ref 30.0–36.0)
MCV: 92.7 fl (ref 78.0–100.0)
Platelets: 169 10*3/uL (ref 150.0–400.0)
RBC: 4.28 Mil/uL (ref 3.87–5.11)
RDW: 12.3 % (ref 11.5–15.5)
WBC: 5.7 10*3/uL (ref 4.0–10.5)

## 2018-08-12 LAB — LIPID PANEL
CHOLESTEROL: 163 mg/dL (ref 0–200)
HDL: 47.7 mg/dL (ref >39.00)
LDL CALC: 92 mg/dL (ref 0–99)
NonHDL: 115.59
TRIGLYCERIDES: 117 mg/dL (ref 0.0–149.0)
Total CHOL/HDL Ratio: 3
VLDL: 23.4 mg/dL (ref 0.0–40.0)

## 2018-08-12 LAB — TSH: TSH: 2.84 u[IU]/mL (ref 0.35–4.50)

## 2018-08-12 LAB — VITAMIN D 25 HYDROXY (VIT D DEFICIENCY, FRACTURES): VITD: 26.2 ng/mL — ABNORMAL LOW (ref 30.00–100.00)

## 2018-08-12 MED ORDER — SIMVASTATIN 40 MG PO TABS: 40.0000 mg | ORAL_TABLET | Freq: Every day | ORAL | 3 refills | Status: DC

## 2018-08-12 MED ORDER — PANTOPRAZOLE SODIUM 40 MG PO TBEC: 40.0000 mg | DELAYED_RELEASE_TABLET | Freq: Every day | ORAL | 3 refills | Status: DC

## 2018-08-12 NOTE — Assessment & Plan Note (Signed)
Feels she needs current dose of PPI.  Given recent stressors, no changes made.

## 2018-08-12 NOTE — Patient Instructions (Addendum)
Please schedule your next medicare wellness visit with me in 1 yr.  Continue to eat heart healthy diet (full of fruits, vegetables, whole grains, lean protein, water--limit salt, fat, and sugar intake) and increase physical activity as tolerated.  Bring a copy of your living will and/or healthcare power of attorney to your next office visit.  Please let us know if you would like [place] to order Cologuard.  Ms. Leah Melendez , Thank you for taking time to come for your Medicare Wellness Visit. I appreciate your ongoing commitment to your health goals. Please review the following plan we discussed and let me know if I can assist you in the future.   These are the goals we discussed: Goals    . Increase physical activity     Starting 07/29/2017, I will attempt to walk and stretch for 30 min 2-3 days per week.        This is a list of the screening recommended for you and due dates:  Health Maintenance  Topic Date Due  . Tetanus Vaccine  11/30/2018  . Mammogram  06/18/2019  . Colon Cancer Screening  07/24/2021  . Flu Shot  Completed  . DEXA scan (bone density measurement)  Completed  .  Hepatitis C: One time screening is recommended by Center for Disease Control  (CDC) for  adults born from [birth year] through 1967.   Completed  . Pneumonia vaccines  Completed    Health Maintenance for Postmenopausal Women Menopause is a normal process in which your reproductive ability comes to an end. This process happens gradually over a span of months to years, usually between the ages of 65 and 64. Menopause is complete when you have missed 12 consecutive menstrual periods. It is important to talk with your health care provider about some of the most common conditions that affect postmenopausal women, such as heart disease, cancer, and bone loss (osteoporosis). Adopting a healthy lifestyle and getting preventive care can help to promote your health and wellness. Those actions can also lower your chances of  developing some of these common conditions. What should I know about menopause? During menopause, you may experience a number of symptoms, such as:  Moderate-to-severe hot flashes.  Night sweats.  Decrease in sex drive.  Mood swings.  Headaches.  Tiredness.  Irritability.  Memory problems.  Insomnia.  Choosing to treat or not to treat menopausal changes is an individual decision that you make with your health care provider. What should I know about hormone replacement therapy and supplements? Hormone therapy products are effective for treating symptoms that are associated with menopause, such as hot flashes and night sweats. Hormone replacement carries certain risks, especially as you become older. If you are thinking about using estrogen or estrogen with progestin treatments, discuss the benefits and risks with your health care provider. What should I know about heart disease and stroke? Heart disease, heart attack, and stroke become more likely as you age. This may be due, in part, to the hormonal changes that your body experiences during menopause. These can affect how your body processes dietary fats, triglycerides, and cholesterol. Heart attack and stroke are both medical emergencies. There are many things that you can do to help prevent heart disease and stroke:  Have your blood pressure checked at least every 1-2 years. High blood pressure causes heart disease and increases the risk of stroke.  If you are 110-71 years old, ask your health care provider if you should take aspirin to prevent a heart  attack or a stroke.  Do not use any tobacco products, including cigarettes, chewing tobacco, or electronic cigarettes. If you need help quitting, ask your health care provider.  It is important to eat a healthy diet and maintain a healthy weight. ? Be sure to include plenty of vegetables, fruits, low-fat dairy products, and lean protein. ? Avoid eating foods that are high in solid  fats, added sugars, or salt (sodium).  Get regular exercise. This is one of the most important things that you can do for your health. ? Try to exercise for at least 150 minutes each week. The type of exercise that you do should increase your heart rate and make you sweat. This is known as moderate-intensity exercise. ? Try to do strengthening exercises at least twice each week. Do these in addition to the moderate-intensity exercise.  Know your numbers.Ask your health care provider to check your cholesterol and your blood glucose. Continue to have your blood tested as directed by your health care provider.  What should I know about cancer screening? There are several types of cancer. Take the following steps to reduce your risk and to catch any cancer development as early as possible. Breast Cancer  Practice breast self-awareness. ? This means understanding how your breasts normally appear and feel. ? It also means doing regular breast self-exams. Let your health care provider know about any changes, no matter how small.  If you are 33 or older, have a clinician do a breast exam (clinical breast exam or CBE) every year. Depending on your age, family history, and medical history, it may be recommended that you also have a yearly breast X-ray (mammogram).  If you have a family history of breast cancer, talk with your health care provider about genetic screening.  If you are at high risk for breast cancer, talk with your health care provider about having an MRI and a mammogram every year.  Breast cancer (BRCA) gene test is recommended for women who have family members with BRCA-related cancers. Results of the assessment will determine the need for genetic counseling and BRCA1 and for BRCA2 testing. BRCA-related cancers include these types: ? Breast. This occurs in males or females. ? Ovarian. ? Tubal. This may also be called fallopian tube cancer. ? Cancer of the abdominal or pelvic lining  (peritoneal cancer). ? Prostate. ? Pancreatic.  Cervical, Uterine, and Ovarian Cancer Your health care provider may recommend that you be screened regularly for cancer of the pelvic organs. These include your ovaries, uterus, and vagina. This screening involves a pelvic exam, which includes checking for microscopic changes to the surface of your cervix (Pap test).  For women ages 21-65, health care providers may recommend a pelvic exam and a Pap test every three years. For women ages 85-65, they may recommend the Pap test and pelvic exam, combined with testing for human papilloma virus (HPV), every five years. Some types of HPV increase your risk of cervical cancer. Testing for HPV may also be done on women of any age who have unclear Pap test results.  Other health care providers may not recommend any screening for nonpregnant women who are considered low risk for pelvic cancer and have no symptoms. Ask your health care provider if a screening pelvic exam is right for you.  If you have had past treatment for cervical cancer or a condition that could lead to cancer, you need Pap tests and screening for cancer for at least 20 years after your treatment. If  Pap tests have been discontinued for you, your risk factors (such as having a new sexual partner) need to be reassessed to determine if you should start having screenings again. Some women have medical problems that increase the chance of getting cervical cancer. In these cases, your health care provider may recommend that you have screening and Pap tests more often.  If you have a family history of uterine cancer or ovarian cancer, talk with your health care provider about genetic screening.  If you have vaginal bleeding after reaching menopause, tell your health care provider.  There are currently no reliable tests available to screen for ovarian cancer.  Lung Cancer Lung cancer screening is recommended for adults 69-25 years old who are at  high risk for lung cancer because of a history of smoking. A yearly low-dose CT scan of the lungs is recommended if you:  Currently smoke.  Have a history of at least 30 pack-years of smoking and you currently smoke or have quit within the past 15 years. A pack-year is smoking an average of one pack of cigarettes per day for one year.  Yearly screening should:  Continue until it has been 15 years since you quit.  Stop if you develop a health problem that would prevent you from having lung cancer treatment.  Colorectal Cancer  This type of cancer can be detected and can often be prevented.  Routine colorectal cancer screening usually begins at age 65 and continues through age 37.  If you have risk factors for colon cancer, your health care provider may recommend that you be screened at an earlier age.  If you have a family history of colorectal cancer, talk with your health care provider about genetic screening.  Your health care provider may also recommend using home test kits to check for hidden blood in your stool.  A small camera at the end of a tube can be used to examine your colon directly (sigmoidoscopy or colonoscopy). This is done to check for the earliest forms of colorectal cancer.  Direct examination of the colon should be repeated every 5-10 years until age 42. However, if early forms of precancerous polyps or small growths are found or if you have a family history or genetic risk for colorectal cancer, you may need to be screened more often.  Skin Cancer  Check your skin from head to toe regularly.  Monitor any moles. Be sure to tell your health care provider: ? About any new moles or changes in moles, especially if there is a change in a mole's shape or color. ? If you have a mole that is larger than the size of a pencil eraser.  If any of your family members has a history of skin cancer, especially at a young age, talk with your health care provider about genetic  screening.  Always use sunscreen. Apply sunscreen liberally and repeatedly throughout the day.  Whenever you are outside, protect yourself by wearing long sleeves, pants, a wide-brimmed hat, and sunglasses.  What should I know about osteoporosis? Osteoporosis is a condition in which bone destruction happens more quickly than new bone creation. After menopause, you may be at an increased risk for osteoporosis. To help prevent osteoporosis or the bone fractures that can happen because of osteoporosis, the following is recommended:  If you are 21-73 years old, get at least 1,000 mg of calcium and at least 600 mg of vitamin D per day.  If you are older than age 11 but  younger than age 19, get at least 1,200 mg of calcium and at least 600 mg of vitamin D per day.  If you are older than age 39, get at least 1,200 mg of calcium and at least 800 mg of vitamin D per day.  Smoking and excessive alcohol intake increase the risk of osteoporosis. Eat foods that are rich in calcium and vitamin D, and do weight-bearing exercises several times each week as directed by your health care provider. What should I know about how menopause affects my mental health? Depression may occur at any age, but it is more common as you become older. Common symptoms of depression include:  Low or sad mood.  Changes in sleep patterns.  Changes in appetite or eating patterns.  Feeling an overall lack of motivation or enjoyment of activities that you previously enjoyed.  Frequent crying spells.  Talk with your health care provider if you think that you are experiencing depression. What should I know about immunizations? It is important that you get and maintain your immunizations. These include:  Tetanus, diphtheria, and pertussis (Tdap) booster vaccine.  Influenza every year before the flu season begins.  Pneumonia vaccine.  Shingles vaccine.  Your health care provider may also recommend other  immunizations. This information is not intended to replace advice given to you by your health care provider. Make sure you discuss any questions you have with your health care provider. Document Released: 12/06/2005 Document Revised: 05/03/2016 Document Reviewed: 07/18/2015 Elsevier Interactive Patient Education  2018 [employer].

## 2018-08-12 NOTE — Progress Notes (Signed)
Subjective:   Patient ID: Leah Melendez, female    DOB: 1945/11/17, 72 y.o.   MRN: [id number]  Leah Melendez is a pleasant 72 y.o. year old female who presents to clinic today with Follow-up  on 08/12/2018  HPI:   Bradly Bienenstock, RN this morning for annual wellness visit.  Health Maintenance  Topic Date Due  . TETANUS/TDAP  11/30/2018  . MAMMOGRAM  06/18/2019  . COLONOSCOPY  07/24/2021  . INFLUENZA VACCINE  Completed  . DEXA SCAN  Completed  . Hepatitis C Screening  Completed  . PNA vac Low Risk Adult  Completed   Grief- unfortunately her husband died unexpectedly 4 months ago.  She has not attended hospice or grief counseling but she feels she is coping well.  She has some resentment and guilt about his death.   She wants to discuss this with me today.  HLD- On zocor 40 mg daily. Due for labs today. Lab Results  Component Value Date   CHOL 164 07/29/2017   HDL 48.30 07/29/2017   LDLCALC 98 07/29/2017   TRIG 90.0 07/29/2017   CHOLHDL 3 07/29/2017   Lab Results  Component Value Date   ALT 9 07/29/2017   AST 13 07/29/2017   ALKPHOS 59 07/29/2017   BILITOT 1.3 (H) 07/29/2017   GERD- feels she does still need to take protonix.  Current Outpatient Medications on File Prior to Visit  Medication Sig Dispense Refill  . Ascorbic Acid (VITAMIN C) 500 MG tablet Take 500 mg by mouth daily.      Marland Kitchen aspirin EC 81 MG tablet Take 81 mg by mouth daily.    . Calcium Carbonate-Vitamin D (CALCIUM 600/VITAMIN D PO) Take 1 tablet by mouth 2 (two) times daily.    . Cholecalciferol ([facility] HIGH POTENCY VITAMIN D) 1000 UNITS tablet Take 1,000 Units by mouth daily.      . fish oil-omega-3 fatty acids 1000 MG capsule Take 1 capsule by mouth 2 (two) times daily. Take one capsule by mouth four times a day     . loratadine (CLARITIN) 10 MG tablet Take 10 mg by mouth daily as needed.     . NONFORMULARY OR COMPOUNDED ITEM Achilles Tendonitis Cream-[facility] 2 refills    .  VITAMIN E PO Take 1 capsule by mouth daily.     No current facility-administered medications on file prior to visit.     Allergies  Allergen Reactions  . Dexilant [Dexlansoprazole] Diarrhea  . Sulfonamide Derivatives     REACTION: as child    Past Medical History:  Diagnosis Date  . Fatty liver 2010  . GERD (gastroesophageal reflux disease)   . H. pylori infection   . Hemangioma 2010  . Hiatal hernia   . Hyperlipidemia     Past Surgical History:  Procedure Laterality Date  . CHOLECYSTECTOMY    . KNEE SURGERY  06/29/2007   Meniscus tear, left knee surgery    Family History  Problem Relation Age of Onset  . Colon cancer Neg Hx     Social History   Socioeconomic History  . Marital status: Married    Spouse name: Not on file  . Number of children: 2  . Years of education: Not on file  . Highest education level: Not on file  Occupational History  . Occupation: Retired Garment/textile technologist  Social Needs  . Financial resource strain: Not on file  . Food insecurity:    Worry: Not on file  Inability: Not on file  . Transportation needs:    Medical: Not on file    Non-medical: Not on file  Tobacco Use  . Smoking status: Never Smoker  . Smokeless tobacco: Never Used  Substance and Sexual Activity  . Alcohol use: No  . Drug use: No  . Sexual activity: Not Currently  Lifestyle  . Physical activity:    Days per week: Not on file    Minutes per session: Not on file  . Stress: Not on file  Relationships  . Social connections:    Talks on phone: Not on file    Gets together: Not on file    Attends religious service: Not on file    Active member of club or organization: Not on file    Attends meetings of clubs or organizations: Not on file    Relationship status: Not on file  . Intimate partner violence:    Fear of current or ex partner: Not on file    Emotionally abused: Not on file    Physically abused: Not on file    Forced sexual activity: Not  on file  Other Topics Concern  . Not on file  Social History Narrative   Has living will.  Husband, [name] is HPOA.  Daughters have a copy of living will and [name] would be HPOA if husband was not able.      Would desire CPR.  She would not want heroic measures.               The PMH, PSH, Social History, Family History, Medications, and allergies have been reviewed in [facility], and have been updated if relevant.  Review of Systems  Constitutional: Negative.   HENT: Negative.   Respiratory: Negative.   Cardiovascular: Negative.   Gastrointestinal: Negative.   Musculoskeletal: Negative.   Neurological: Negative.   Psychiatric/Behavioral: Negative for behavioral problems, confusion, decreased concentration, dysphoric mood, hallucinations, self-injury, sleep disturbance and suicidal ideas. The patient is not hyperactive.   All other systems reviewed and are negative.      Objective:    BP 138/78 (BP Location: Left Arm, Patient Position: Sitting, Cuff Size: Normal)   Pulse 78   Temp 97.8 F (36.6 C) (Oral)   Ht 5\' 1"  (1.549 m)   Wt 169 lb (76.7 kg)   SpO2 98%   BMI 31.93 kg/m   Wt Readings from Last 3 Encounters:  08/12/18 169 lb (76.7 kg)  08/12/18 169 lb (76.7 kg)  08/11/17 183 lb 12 oz (83.3 kg)    Physical Exam  Constitutional: She is oriented to person, place, and time. She appears well-developed and well-nourished. No distress.  HENT:  Head: Normocephalic and atraumatic.  Eyes: EOM are normal.  Neck: Normal range of motion.  Cardiovascular: Normal rate.  Pulmonary/Chest: Effort normal.  Musculoskeletal: Normal range of motion. She exhibits no edema.  Neurological: She is alert and oriented to person, place, and time. No cranial nerve deficit.  Skin: Skin is warm and dry. She is not diaphoretic.  Psychiatric: She has a normal mood and affect. Her behavior is normal. Judgment and thought content normal.  Nursing note and vitals  reviewed.         Assessment & Plan:   Healthcare maintenance - Plan: VITAMIN D 25 Hydroxy (Vit-D Deficiency, Fractures), Comprehensive metabolic panel, Lipid panel, TSH, CBC  Medicare annual wellness visit, subsequent - Plan: CBC  Hyperlipidemia, mixed - Plan: Comprehensive metabolic panel, Lipid panel, TSH  HYPERLIPIDEMIA, MIXED  Gastroesophageal reflux disease, esophagitis presence not specified  Well woman exam  Grief No follow-ups on file.

## 2018-08-12 NOTE — Telephone Encounter (Signed)
Copied from [place] [phone]. Topic: Quick Communication - See Telephone Encounter >> Aug 12, 2018  4:30 PM Vernona Rieger wrote: CRM for notification. See Telephone encounter for: 08/12/18.  Patient states she called [employer] today and they have no record of her last colonoscopy. She thinks it has been atleast over 10 years ago. She said that she is interested in doing the colo-guard if that is okay with Dr Deborra Medina. Please contact patient. She said that they will pay for either one.

## 2018-08-12 NOTE — Assessment & Plan Note (Signed)
On zocor. Check labs today. The patient indicates understanding of these issues and agrees with the plan.

## 2018-08-12 NOTE — Patient Instructions (Addendum)
Great to see you. Happy birthday!  I am so sorry for your loss.  You can call hospice about grief counseling.  Please call your insurance company- make sure they cover cologuard and when your last colonoscopy.

## 2018-08-12 NOTE — Assessment & Plan Note (Signed)
We talked at length about how she is doing and some guilt and resentment feelings she has.  She has joined a widowers group which she thinks has helped. Her grief is certainly appropriate.  I did also suggest grief counseling through hospice.  Offered my support and ask that she continue to keep me updated.

## 2018-08-12 NOTE — Progress Notes (Signed)
I reviewed health advisor's note, was available for consultation, and agree with documentation and plan.  

## 2018-08-12 NOTE — Progress Notes (Signed)
Subjective:   Leah Melendez is a 72 y.o. female who presents for Medicare Annual (Subsequent) preventive examination.  Review of Systems: No ROS.  Medicare Wellness Visit. Additional risk factors are reflected in the social history. Cardiac Risk Factors include: advanced age (>110men, >3 women) Sleep patterns: Takes Tylenol PM nightly and sleeps   Home Safety/Smoke Alarms: Feels safe in home. Smoke alarms in place. Lives alone in 1 story home.  Female:     Mammo- pt reports 06/22/18-normal. Dr.Holland w/ [facility]    Dexa scan- utd       CCS-last reported 2006 Eye-yearly in December.    Objective:     Vitals: BP 138/78 (BP Location: Left Arm, Patient Position: Sitting, Cuff Size: Normal)   Pulse 78   Ht 5\' 1"  (1.549 m)   Wt 169 lb (76.7 kg)   SpO2 97%   BMI 31.93 kg/m   Body mass index is 31.93 kg/m.  Advanced Directives 08/12/2018 07/29/2017 07/22/2016  Does Patient Have a Medical Advance Directive? Yes Yes Yes  Type of Paramedic of [place];Living will [place];Living will [place];Living will  Does patient want to make changes to medical advance directive? - - No - Patient declined  Copy of [place] in Chart? No - copy requested No - copy requested No - copy requested    Tobacco Social History   Tobacco Use  Smoking Status Never Smoker  Smokeless Tobacco Never Used     Counseling given: Not Answered   Clinical Intake: Pain : No/denies pain   Past Medical History:  Diagnosis Date  . Fatty liver 2010  . GERD (gastroesophageal reflux disease)   . H. pylori infection   . Hemangioma 2010  . Hiatal hernia   . Hyperlipidemia    Past Surgical History:  Procedure Laterality Date  . CHOLECYSTECTOMY    . KNEE SURGERY  06/29/2007   Meniscus tear, left knee surgery   Family History  Problem Relation Age of Onset  . Colon cancer Neg Hx    Social History    Socioeconomic History  . Marital status: Married    Spouse name: Not on file  . Number of children: 2  . Years of education: Not on file  . Highest education level: Not on file  Occupational History  . Occupation: Retired Garment/textile technologist  Social Needs  . Financial resource strain: Not on file  . Food insecurity:    Worry: Not on file    Inability: Not on file  . Transportation needs:    Medical: Not on file    Non-medical: Not on file  Tobacco Use  . Smoking status: Never Smoker  . Smokeless tobacco: Never Used  Substance and Sexual Activity  . Alcohol use: No  . Drug use: No  . Sexual activity: Not Currently  Lifestyle  . Physical activity:    Days per week: Not on file    Minutes per session: Not on file  . Stress: Not on file  Relationships  . Social connections:    Talks on phone: Not on file    Gets together: Not on file    Attends religious service: Not on file    Active member of club or organization: Not on file    Attends meetings of clubs or organizations: Not on file    Relationship status: Not on file  Other Topics Concern  . Not on file  Social  History Narrative   Has living will.  Husband, [name] is HPOA.  Daughters have a copy of living will and [name] would be HPOA if husband was not able.      Would desire CPR.  She would not want heroic measures.                Outpatient Encounter Medications as of 08/12/2018  Medication Sig  . Ascorbic Acid (VITAMIN C) 500 MG tablet Take 500 mg by mouth daily.    Marland Kitchen aspirin EC 81 MG tablet Take 81 mg by mouth daily.  . Calcium Carbonate-Vitamin D (CALCIUM 600/VITAMIN D PO) Take 1 tablet by mouth 2 (two) times daily.  . Cholecalciferol ([facility]S HIGH POTENCY VITAMIN D) 1000 UNITS tablet Take 1,000 Units by mouth daily.    . fish oil-omega-3 fatty acids 1000 MG capsule Take 1 capsule by mouth 2 (two) times daily. Take one capsule by mouth four times a day   . loratadine  (CLARITIN) 10 MG tablet Take 10 mg by mouth daily as needed.   . NONFORMULARY OR COMPOUNDED ITEM Achilles Tendonitis Cream[facility] 2 refills  . pantoprazole (PROTONIX) 40 MG tablet take 1 tablet by mouth once daily  . ranitidine (ZANTAC) 150 MG tablet Take 1 tablet (150 mg total) by mouth 2 (two) times daily.  . simvastatin (ZOCOR) 40 MG tablet TAKE 1 TABLET BY MOUTH ONCE DAILY  . VITAMIN E PO Take 1 capsule by mouth daily.  . [DISCONTINUED] FLUAD 0.5 ML SUSY    Facility-Administered Encounter Medications as of 08/12/2018  Medication  . betamethasone acetate-betamethasone sodium phosphate (CELESTONE) injection 12 mg  . betamethasone acetate-betamethasone sodium phosphate (CELESTONE) injection 3 mg  . betamethasone acetate-betamethasone sodium phosphate (CELESTONE) injection 3 mg  . triamcinolone acetonide (KENALOG) 10 MG/ML injection 10 mg    Activities of Daily Living In your present state of health, do you have any difficulty performing the following activities: 08/12/2018  Hearing? N  Vision? N  Difficulty concentrating or making decisions? N  Walking or climbing stairs? N  Dressing or bathing? N  Doing errands, shopping? N  Preparing Food and eating ? N  Using the Toilet? N  In the past six months, have you accidently leaked urine? N  Do you have problems with loss of bowel control? N  Managing your Medications? N  Managing your Finances? N  Housekeeping or managing your Housekeeping? N  Some recent data might be hidden    Patient Care Team: Lucille Passy, MD as PCP - General (Family Medicine) Johnney Ou., MD as Consulting Physician (Internal Medicine) Inda Castle, MD (Inactive) as Consulting Physician (Gastroenterology)    Assessment:   This is a routine wellness examination for Leah Melendez. Physical assessment deferred to PCP.  Exercise Activities and Dietary recommendations Current Exercise Habits: The patient does not participate in regular  exercise at present, Exercise limited by: None identified Diet (meal preparation, eat out, water intake, caffeinated beverages, dairy products, fruits and vegetables): well balanced   Goals    . Increase physical activity     Starting 07/29/2017, I will attempt to walk and stretch for 30 min 2-3 days per week.        Fall Risk Fall Risk  08/12/2018 07/31/2017 07/29/2017 07/22/2016 07/18/2015  Falls in the past year? No No Yes No No  Number falls in past yr: - - 1 - -  Injury with Fall? - - Yes - -    Depression Screen PHQ  2/9 Scores 08/12/2018 07/31/2017 07/29/2017 07/22/2016  PHQ - 2 Score 0 0 0 0  PHQ- 9 Score - - 0 -     Cognitive Function Ad8 score reviewed for issues:  Issues making decisions:no  Less interest in hobbies / activities:no  Repeats questions, stories (family complaining):no  Trouble using ordinary gadgets (microwave, computer, phone):no  Forgets the month or year: no  Mismanaging finances: no  Remembering appts:no  Daily problems with thinking and/or memory:no Ad8 score is=0   MMSE - Mini Mental State Exam 07/29/2017 07/22/2016  Orientation to time 5 5  Orientation to Place 5 5  Registration 3 3  Attention/ Calculation 0 0  Recall 3 3  Language- name 2 objects 0 0  Language- repeat 1 1  Language- follow 3 step command 3 3  Language- read & follow direction 0 0  Write a sentence 0 0  Copy design 0 0  Total score 20 20        Immunization History  Administered Date(s) Administered  . Influenza Split 07/10/2011  . Influenza Whole 07/27/2010, 07/18/2012  . Influenza,inj,Quad PF,6+ Mos 07/14/2014  . Influenza-Unspecified 06/28/2013, 06/19/2015  . Pneumococcal Conjugate-13 04/05/2014  . Pneumococcal Polysaccharide-23 01/13/2013  . Td 11/30/2008  . Zoster 11/30/2008  . Zoster Recombinat (Shingrix) 01/29/2018, 05/06/2018    Screening Tests Health Maintenance  Topic Date Due  . TETANUS/TDAP  11/30/2018  . MAMMOGRAM  06/18/2019  .  COLONOSCOPY  07/24/2021  . INFLUENZA VACCINE  Completed  . DEXA SCAN  Completed  . Hepatitis C Screening  Completed  . PNA vac Low Risk Adult  Completed       Plan:    Please schedule your next medicare wellness visit with me in 1 yr.  Continue to eat heart healthy diet (full of fruits, vegetables, whole grains, lean protein, water--limit salt, fat, and sugar intake) and increase physical activity as tolerated.  Bring a copy of your living will and/or healthcare power of attorney to your next office visit.  Please let us know if you would like [place] to order Cologuard.  I have personally reviewed and noted the following in the patient's chart:   . Medical and social history . Use of alcohol, tobacco or illicit drugs  . Current medications and supplements . Functional ability and status . Nutritional status . Physical activity . Advanced directives . List of other physicians . Hospitalizations, surgeries, and ER visits in previous 12 months . Vitals . Screenings to include cognitive, depression, and falls . Referrals and appointments  In addition, I have reviewed and discussed with patient certain preventive protocols, quality metrics, and best practice recommendations. A written personalized care plan for preventive services as well as general preventive health recommendations were provided to patient.     Shela Nevin, [place]  08/12/2018

## 2018-08-13 NOTE — Telephone Encounter (Addendum)
Cologuard order placed, left patient a voicemail letting her know she should receive the kit soon.

## 2018-08-13 NOTE — Telephone Encounter (Signed)
Yes okay to order 

## 2018-08-13 NOTE — Telephone Encounter (Signed)
Order form signed & faxed to Cologuard.

## 2018-08-13 NOTE — Addendum Note (Signed)
Addended by: Kateri Mc E on: 08/13/2018 01:17 PM   Modules accepted: Orders

## 2018-08-17 DIAGNOSIS — E559 Vitamin D deficiency, unspecified: Secondary | ICD-10-CM | POA: Insufficient documentation

## 2018-08-17 NOTE — Assessment & Plan Note (Signed)
History of osteopenia.

## 2018-08-17 NOTE — Assessment & Plan Note (Signed)
Order Vit D today.

## 2018-08-19 LAB — COLOGUARD: Cologuard: NEGATIVE

## 2018-08-26 ENCOUNTER — Telehealth: Payer: Self-pay

## 2018-08-26 NOTE — Telephone Encounter (Signed)
PEC-LMOVM that Cologuard results were WNL which are great/you may advise pt of this/thx dmf

## 2019-06-21 ENCOUNTER — Other Ambulatory Visit: Payer: Self-pay | Admitting: Family Medicine

## 2019-06-21 ENCOUNTER — Telehealth: Payer: Self-pay

## 2019-06-21 NOTE — Telephone Encounter (Signed)
Okay with me.  Very nice lady.

## 2019-06-21 NOTE — Telephone Encounter (Signed)
Patient needs new pt appt with Dr Aundra Dubin

## 2019-06-21 NOTE — Telephone Encounter (Signed)
Copied from [place] [phone]. Topic: Quick Communication - See Telephone Encounter >> Jun 21, 2019 11:57 AM Loma [place] wrote: CRM for notification. See Telephone encounter for: 06/21/19. Pt is requesting form Dr Deborra Medina to transfer to [facility] with Dr [name] McLean-Scocuzza. She is 83 and the drive from [place] is getting to much.  Pls FU with Pt AT [phone]

## 2019-07-01 ENCOUNTER — Emergency Department
Admission: EM | Admit: 2019-07-01 | Discharge: 2019-07-01 | Disposition: A | Discharge status: Home / Self Care | Payer: Medicare Other | Attending: Student in an Organized Health Care Education/Training Program | Admitting: Student in an Organized Health Care Education/Training Program

## 2019-07-01 ENCOUNTER — Emergency Department: Payer: Medicare Other

## 2019-07-01 ENCOUNTER — Other Ambulatory Visit: Payer: Self-pay

## 2019-07-01 ENCOUNTER — Encounter: Payer: Self-pay | Admitting: Emergency Medicine

## 2019-07-01 DIAGNOSIS — E860 Dehydration: Secondary | ICD-10-CM | POA: Diagnosis not present

## 2019-07-01 DIAGNOSIS — Z7982 Long term (current) use of aspirin: Secondary | ICD-10-CM | POA: Insufficient documentation

## 2019-07-01 DIAGNOSIS — R55 Syncope and collapse: Secondary | ICD-10-CM | POA: Insufficient documentation

## 2019-07-01 DIAGNOSIS — M8580 Other specified disorders of bone density and structure, unspecified site: Secondary | ICD-10-CM | POA: Insufficient documentation

## 2019-07-01 DIAGNOSIS — Z79899 Other long term (current) drug therapy: Secondary | ICD-10-CM | POA: Insufficient documentation

## 2019-07-01 LAB — BASIC METABOLIC PANEL
Anion gap: 10 (ref 5–15)
BUN: 13 mg/dL (ref 8–23)
CO2: 23 mmol/L (ref 22–32)
Calcium: 9.4 mg/dL (ref 8.9–10.3)
Chloride: 106 mmol/L (ref 98–111)
Creatinine, Ser: 0.75 mg/dL (ref 0.44–1.00)
GFR calc Af Amer: >60 mL/min (ref >60)
GFR calc non Af Amer: >60 mL/min (ref >60)
Glucose, Bld: 106 mg/dL — ABNORMAL HIGH (ref 70–99)
Potassium: 4.1 mmol/L (ref 3.5–5.1)
Sodium: 139 mmol/L (ref 135–145)

## 2019-07-01 LAB — URINALYSIS, COMPLETE (UACMP) WITH MICROSCOPIC
Bacteria, UA: NONE SEEN
Bilirubin Urine: NEGATIVE
Glucose, UA: NEGATIVE mg/dL
Ketones, ur: NEGATIVE mg/dL
Nitrite: NEGATIVE
Protein, ur: NEGATIVE mg/dL
Specific Gravity, Urine: 1.008 (ref 1.005–1.030)
pH: 6 (ref 5.0–8.0)

## 2019-07-01 LAB — CBC
HCT: 41.6 % (ref 36.0–46.0)
Hemoglobin: 14.1 g/dL (ref 12.0–15.0)
MCH: 31 pg (ref 26.0–34.0)
MCHC: 33.9 g/dL (ref 30.0–36.0)
MCV: 91.4 fL (ref 80.0–100.0)
Platelets: 170 10*3/uL (ref 150–400)
RBC: 4.55 MIL/uL (ref 3.87–5.11)
RDW: 11.4 % — ABNORMAL LOW (ref 11.5–15.5)
WBC: 8 10*3/uL (ref 4.0–10.5)
nRBC: 0 % (ref 0.0–0.2)

## 2019-07-01 LAB — HM DEXA SCAN

## 2019-07-01 LAB — TROPONIN I (HIGH SENSITIVITY): Troponin I (High Sensitivity): 4 ng/L (ref <18)

## 2019-07-01 MED ORDER — ACETAMINOPHEN 500 MG PO TABS
1000.0000 mg | ORAL_TABLET | Freq: Once | ORAL | Status: AC
  Administered 2019-07-01: 1000 mg via ORAL
  Filled 2019-07-01: qty 2

## 2019-07-01 MED ORDER — SODIUM CHLORIDE 0.9 % IV BOLUS
1000.0000 mL | Freq: Once | INTRAVENOUS | Status: AC
  Administered 2019-07-01: 1000 mL via INTRAVENOUS

## 2019-07-01 NOTE — ED Provider Notes (Signed)
[facility] Emergency Department Provider Note    First MD Initiated Contact with Patient 07/01/19 1829     (approximate)  I have reviewed the triage vital signs and the nursing notes.   HISTORY  Chief Complaint Loss of Consciousness    HPI Leah Melendez is a 73 y.o. female close past medical history presents to the ER for evaluation of 3 near syncopal episodes that occurred after she was being seen at her OB/GYN clinic.  States the last time she had anything to eat or drink was this morning.  She had a cereal bar and then 2 cups of coffee.  This afternoon while she was being seen in clinic for her mammogram and bone density scan after lying down for her.  She was sat up quickly and started feeling lightheaded like she was about to pass out.  Did not fully lose consciousness.  This happened 3 times in a row.  She denies any chest pain.  No nausea or vomiting.  No numbness or tingling.  Feels dehydrated.    Past Medical History:  Diagnosis Date  . Fatty liver 2010  . GERD (gastroesophageal reflux disease)   . H. pylori infection   . Hemangioma 2010  . Hiatal hernia   . Hyperlipidemia    Family History  Problem Relation Age of Onset  . Colon cancer Neg Hx    Past Surgical History:  Procedure Laterality Date  . CHOLECYSTECTOMY    . KNEE SURGERY  06/29/2007   Meniscus tear, left knee surgery   Patient Active Problem List   Diagnosis Date Noted  . Vitamin D deficiency 08/17/2018  . Grief 08/12/2018  . 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 HIATAL HERNIA 11/30/2008  . Disorder of bone and cartilage 11/30/2008  . GERD 06/01/2008  . HYPERLIPIDEMIA, MIXED 03/19/2007      Prior to Admission medications   Medication Sig Start Date End Date Taking? Authorizing Provider  Ascorbic Acid (VITAMIN C) 500 MG tablet Take 500 mg by mouth daily.     Yes [provider]  aspirin EC 81 MG tablet Take 81 mg by mouth daily.   Yes  [provider]  Calcium Carbonate-Vitamin D (CALCIUM 600/VITAMIN D PO) Take 1 tablet by mouth 2 (two) times daily.   Yes [provider]  Cholecalciferol (CVS HIGH POTENCY VITAMIN D) 1000 UNITS tablet Take 1,000 Units by mouth daily.     Yes [provider]  fish oil-omega-3 fatty acids 1000 MG capsule Take 2 g by mouth daily.    Yes [provider]  loratadine (CLARITIN) 10 MG tablet Take 10 mg by mouth daily as needed for allergies.    Yes [provider]  NONFORMULARY OR COMPOUNDED ITEM Achilles Tendonitis Cream[facility] 2 refills   Yes [provider]  pantoprazole (PROTONIX) 40 MG tablet TAKE 1 TABLET(40 MG) BY MOUTH DAILY Patient taking differently: Take 40 mg by mouth daily.  06/21/19  Yes Lucille Passy, MD  simvastatin (ZOCOR) 40 MG tablet Take 1 tablet (40 mg total) by mouth daily. 08/12/18  Yes Lucille Passy, MD  vitamin E 400 UNIT capsule Take 400 Units by mouth daily.    Yes [provider]    Allergies Dexilant [dexlansoprazole] and Sulfonamide derivatives    Social History Social History   Tobacco Use  . Smoking status: Never Smoker  . Smokeless tobacco: Never Used  Substance Use Topics  . Alcohol use: No  .  Drug use: No    Review of Systems Patient denies headaches, rhinorrhea, blurry vision, numbness, shortness of breath, chest pain, edema, cough, abdominal pain, nausea, vomiting, diarrhea, dysuria, fevers, rashes or hallucinations unless otherwise stated above in HPI. ____________________________________________   PHYSICAL EXAM:  VITAL SIGNS: Vitals:   07/01/19 2223 07/01/19 2225  BP: (!) 147/79   Pulse: 80 90  Resp: 14 19  Temp:    SpO2: 100% 100%    Constitutional: Alert and oriented.  Eyes: Conjunctivae are normal.  Head: Atraumatic. Nose: No congestion/rhinnorhea. Mouth/Throat: Mucous membranes are moist.   Neck: No stridor. Painless ROM.  Cardiovascular: Normal rate,  regular rhythm. Grossly normal heart sounds.  Good peripheral circulation. Respiratory: Normal respiratory effort.  No retractions. Lungs CTAB. Gastrointestinal: Soft and nontender. No distention. No abdominal bruits. No CVA tenderness. Genitourinary:  Musculoskeletal: No lower extremity tenderness nor edema.  No joint effusions. Neurologic:  Normal speech and language. No gross focal neurologic deficits are appreciated. No facial droop Skin:  Skin is warm, dry and intact. No rash noted. Psychiatric: Mood and affect are normal. Speech and behavior are normal.  ____________________________________________   LABS (all labs ordered are listed, but only abnormal results are displayed)  Results for orders placed or performed during the hospital encounter of 07/01/19 (from the past 24 hour(s))  Basic metabolic panel     Status: Abnormal   Collection Time: 07/01/19  6:35 PM  Result Value Ref Range   Sodium 139 135 - 145 mmol/L   Potassium 4.1 3.5 - 5.1 mmol/L   Chloride 106 98 - 111 mmol/L   CO2 23 22 - 32 mmol/L   Glucose, Bld 106 (H) 70 - 99 mg/dL   BUN 13 8 - 23 mg/dL   Creatinine, Ser 0.75 0.44 - 1.00 mg/dL   Calcium 9.4 8.9 - 10.3 mg/dL   GFR calc non Af Amer >60 >60 mL/min   GFR calc Af Amer >60 >60 mL/min   Anion gap 10 5 - 15  CBC     Status: Abnormal   Collection Time: 07/01/19  6:35 PM  Result Value Ref Range   WBC 8.0 4.0 - 10.5 K/uL   RBC 4.55 3.87 - 5.11 MIL/uL   Hemoglobin 14.1 12.0 - 15.0 g/dL   HCT 41.6 36.0 - 46.0 %   MCV 91.4 80.0 - 100.0 fL   MCH 31.0 26.0 - 34.0 pg   MCHC 33.9 30.0 - 36.0 g/dL   RDW 11.4 (L) 11.5 - 15.5 %   Platelets 170 150 - 400 K/uL   nRBC 0.0 0.0 - 0.2 %  Urinalysis, Complete w Microscopic     Status: Abnormal   Collection Time: 07/01/19  6:35 PM  Result Value Ref Range   Color, Urine YELLOW (A) YELLOW   APPearance CLEAR (A) CLEAR   Specific Gravity, Urine 1.008 1.005 - 1.030   pH 6.0 5.0 - 8.0   Glucose, UA NEGATIVE NEGATIVE mg/dL    Hgb urine dipstick SMALL (A) NEGATIVE   Bilirubin Urine NEGATIVE NEGATIVE   Ketones, ur NEGATIVE NEGATIVE mg/dL   Protein, ur NEGATIVE NEGATIVE mg/dL   Nitrite NEGATIVE NEGATIVE   Leukocytes,Ua MODERATE (A) NEGATIVE   RBC / HPF 0-5 0 - 5 RBC/hpf   WBC, UA 11-20 0 - 5 WBC/hpf   Bacteria, UA NONE SEEN NONE SEEN   Squamous Epithelial / LPF 0-5 0 - 5  Troponin I (High Sensitivity)     Status: None   Collection Time: 07/01/19  6:35  PM  Result Value Ref Range   Troponin I (High Sensitivity) 4 <18 ng/L   ____________________________________________  EKG My review and personal interpretation at Time: 18:17   Indication: near syncope  Rate: 100  Rhythm: sinus Axis: normal Other: normal intervals, no stemi ____________________________________________  RADIOLOGY   ____________________________________________   PROCEDURES  Procedure(s) performed:  Procedures    Critical Care performed: no ____________________________________________   INITIAL IMPRESSION / ASSESSMENT AND PLAN / ED COURSE  Pertinent labs & imaging results that were available during my care of the patient were reviewed by me and considered in my medical decision making (see chart for details).   DDX: dehydration, dysrhythmia, electrolye abn, vertigo,  JHOSELIN CRAYCRAFT is a 73 y.o. who presents to the ED with multiple near syncopal episodes as described above.  Sound like she was getting dehydrated and orthostatic.  Her neuro exam is nonfocal.  Complains of mild headache.  Will give IV fluids and reassess.  Clinical Course as of Jun 30 2322  Thu Jul 01, 2019  2144 CT head is reassuring.  Patient able to ambulate with steady gait.   [PR]    Clinical Course User Index [PR] Merlyn Lot, MD  Patient had significant improvement after IV fluids.  Denies any discomfort.  No signs of infectious process.  Stable appropriate for outpatient follow-up.  The patient was evaluated in Emergency Department today  for the symptoms described in the history of present illness. He/she was evaluated in the context of the global COVID-19 pandemic, which necessitated consideration that the patient might be at risk for infection with the SARS-CoV-2 virus that causes COVID-19. Institutional protocols and algorithms that pertain to the evaluation of patients at risk for COVID-19 are in a state of rapid change based on information released by regulatory bodies including the CDC and federal and state organizations. These policies and algorithms were followed during the patient's care in the ED.  As part of my medical decision making, I reviewed the following data within the [place] notes reviewed and incorporated, Labs reviewed, notes from prior ED visits and [employer] Controlled Substance Database   ____________________________________________   FINAL CLINICAL IMPRESSION(S) / ED DIAGNOSES  Final diagnoses:  Dehydration  Near syncope      NEW MEDICATIONS STARTED DURING THIS VISIT:  Discharge Medication List as of 07/01/2019  9:45 PM       Note:  This document was prepared using Dragon voice recognition software and may include unintentional dictation errors.    Merlyn Lot, MD 07/01/19 [phone]

## 2019-07-01 NOTE — ED Notes (Addendum)
Pt assisted to/from bedside toilet. Pt denies dizziness and is steady. Warm blankets given to pt.

## 2019-07-01 NOTE — ED Notes (Signed)
Next troponin sent to lab.

## 2019-07-01 NOTE — ED Triage Notes (Signed)
Patient presents to the ED via EMS from [facility] for a syncopal episode.  Patient reports 3-4 syncopal episodes today.  The first episode was when she was sat up after a bone density scan at her gynecologist today.  Per EMS staff at [facility] was concerned that patient's ekg showed an inferior infarct.  EMS brought this EKG with them and this RN will place in chart.  Patient denies any pain.  Patient cannot tolerate orthostatics, when she sits up she feels very dizzy and like she may pass out.  Patient is hypertensive which is abnormal for her.

## 2019-07-01 NOTE — ED Notes (Signed)
While walking with pt vitals 115 HR; 20 RR; 98% RA; pt states she feels a little dizzy but much better than earlier today; pt steady.

## 2019-07-01 NOTE — ED Notes (Signed)
Pt given food tray and drink verbal okay from [place].

## 2019-07-02 ENCOUNTER — Ambulatory Visit: Payer: Medicare Other | Admitting: Family Medicine

## 2019-07-06 LAB — HM MAMMOGRAPHY

## 2019-07-12 ENCOUNTER — Telehealth: Payer: Self-pay | Admitting: Family Medicine

## 2019-07-12 NOTE — Telephone Encounter (Signed)
Patient schedued TOC with Dr. Terese Door 09/10/2019

## 2019-07-12 NOTE — Telephone Encounter (Signed)
°  Relation to pt: self  Call back number: [phone]  Pharmacy:  [facility] [place], [place] [phone] (Phone) [phone] (Fax)    Reason for call:  Patient schedued TOC with Dr. Terese Door 09/10/2019 and requesting a refill of pantoprazole (PROTONIX) 40 MG tablet and simvastatin (ZOCOR) 40 MG tablet to hold her over until appointment.

## 2019-07-15 ENCOUNTER — Encounter: Payer: Self-pay | Admitting: Family Medicine

## 2019-07-15 ENCOUNTER — Ambulatory Visit: Payer: Medicare Other | Admitting: Family Medicine

## 2019-07-15 ENCOUNTER — Other Ambulatory Visit: Payer: Self-pay

## 2019-07-15 VITALS — BP 142/90 | HR 82 | Temp 97.9°F | Ht 61.0 in | Wt 178.8 lb

## 2019-07-15 DIAGNOSIS — R55 Syncope and collapse: Secondary | ICD-10-CM

## 2019-07-15 DIAGNOSIS — R0609 Other forms of dyspnea: Secondary | ICD-10-CM | POA: Diagnosis not present

## 2019-07-15 HISTORY — DX: Other forms of dyspnea: R06.09

## 2019-07-15 HISTORY — DX: Syncope and collapse: R55

## 2019-07-15 NOTE — Assessment & Plan Note (Signed)
-  This seems to have resolved and was likely caused by orthostasis related to dehydration. -Reminded to remain well hydrated.  Recheck electrolytes today.

## 2019-07-15 NOTE — Patient Instructions (Signed)
I have entered a referral to cardiology, you should be contacted to set up an appointment.  If you have increasing chest pain or tightness or increasing shortness of breath please call [phone]

## 2019-07-15 NOTE — Assessment & Plan Note (Signed)
-  Her symptoms of chest tightness with shortness of breath and arm heaviness on exertion are concerning.  -I reviewed recent EKG from ER, no signs of ischemia on previous EKG.   -I have recommended that she see a cardiologist and I have placed a referral for this.  She requests Dr. Humphrey Rolls in [facility] who is the former cardiologist of her late husband.   -Discussed that if she has worsening chest pain or increasing shortness of breath she should contact 911.

## 2019-07-15 NOTE — Progress Notes (Signed)
NYHA PADO - 73 y.o. female MRN [id number]  Date of birth: 1946/01/27  Subjective Chief Complaint  Patient presents with  . Loss of Consciousness    HPI Leah Melendez is a 73 y.o. female with history of HLD here today for follow up of syncope. She reports that on 07/01/2019 she was at her GYN office getting a DEXA and mammogram.  Upon sitting up on table after DEXA she felt lightheaded and reports that she passed out.  She woke up to medical staff at her GYN around her and sat there for a while.  When she tried to stand up she reports that she passed out again.  Her daughter then took her to a local [facility] urgent care where she was evaluated.  She reports having two additional presyncopal episodes there.  An EKG was completed that showed mild tachycardia with mild 1st degree AV block and possible inferior infarct.  She was advised to go to the hospital from there.  In the ER she was given fluid and EKG repeated showing sinus tachycardia.  After fluid repletion she states that she felt much better.  She did not have any electrolyte abnormalities and   She has not had any further episodes of presyncope since that time.  She does however report that she has been having tightness in her chest with mild sob and feeling of heaviness down her left arm with exertional activities such as carrying in groceries or walking up hills. She first noticed this a couple of weeks ago The pain will resolve with rest.  She denies palpitations.  She had an in home evaluation by a nurse from her insurance company today who suggested that she see our clinic to discuss this.    ROS:  A comprehensive ROS was completed and negative except as noted per HPI  Allergies  Allergen Reactions  . Dexilant [Dexlansoprazole] Diarrhea  . Sulfonamide Derivatives     REACTION: as child    Past Medical History:  Diagnosis Date  . Fatty liver 2010  . GERD (gastroesophageal reflux disease)   . H. pylori infection    . Hemangioma 2010  . Hiatal hernia   . Hyperlipidemia     Past Surgical History:  Procedure Laterality Date  . CHOLECYSTECTOMY    . KNEE SURGERY  06/29/2007   Meniscus tear, left knee surgery    Social History   Socioeconomic History  . Marital status: Married    Spouse name: Not on file  . Number of children: 2  . Years of education: Not on file  . Highest education level: Not on file  Occupational History  . Occupation: Retired Garment/textile technologist  Social Needs  . Financial resource strain: Not on file  . Food insecurity    Worry: Not on file    Inability: Not on file  . Transportation needs    Medical: Not on file    Non-medical: Not on file  Tobacco Use  . Smoking status: Never Smoker  . Smokeless tobacco: Never Used  Substance and Sexual Activity  . Alcohol use: No  . Drug use: No  . Sexual activity: Not Currently  Lifestyle  . Physical activity    Days per week: Not on file    Minutes per session: Not on file  . Stress: Not on file  Relationships  . Social Herbalist on phone: Not on file    Gets together: Not on file    Attends  religious service: Not on file    Active member of club or organization: Not on file    Attends meetings of clubs or organizations: Not on file    Relationship status: Not on file  Other Topics Concern  . Not on file  Social History Narrative   Has living will.  Husband, [name] is HPOA.  Daughters have a copy of living will and [name] would be HPOA if husband was not able.      Would desire CPR.  She would not want heroic measures.                Family History  Problem Relation Age of Onset  . Colon cancer Neg Hx     Health Maintenance  Topic Date Due  . TETANUS/TDAP  11/30/2018  . INFLUENZA VACCINE  05/29/2019  . MAMMOGRAM  06/18/2019  . COLONOSCOPY  07/24/2021  . DEXA SCAN  Completed  . Hepatitis C Screening  Completed  . PNA vac Low Risk Adult  Completed     ----------------------------------------------------------------------------------------------------------------------------------------------------------------------------------------------------------------- Physical Exam BP (!) 142/90   Pulse 82   Temp 97.9 F (36.6 C) (Oral)   Ht 5\' 1"  (1.549 m)   Wt 178 lb 12.8 oz (81.1 kg)   SpO2 98%   BMI 33.78 kg/m   Physical Exam Constitutional:      Appearance: Normal appearance.  HENT:     Head: Normocephalic and atraumatic.     Right Ear: Tympanic membrane normal.     Left Ear: Tympanic membrane normal.     Mouth/Throat:     Mouth: Mucous membranes are moist.  Eyes:     General: No scleral icterus. Neck:     Musculoskeletal: Neck supple.  Cardiovascular:     Rate and Rhythm: Normal rate and regular rhythm.     Pulses: Normal pulses.     Heart sounds: Normal heart sounds.  Pulmonary:     Effort: Pulmonary effort is normal.     Breath sounds: Normal breath sounds.  Skin:    General: Skin is warm and dry.  Neurological:     General: No focal deficit present.     Mental Status: She is alert.  Psychiatric:        Mood and Affect: Mood normal.        Behavior: Behavior normal.     ------------------------------------------------------------------------------------------------------------------------------------------------------------------------------------------------------------------- Assessment and Plan  Syncope -This seems to have resolved and was likely caused by orthostasis related to dehydration. -Reminded to remain well hydrated.  Recheck electrolytes today.   Exertional dyspnea -Her symptoms of chest tightness with shortness of breath and arm heaviness on exertion are concerning.  -I reviewed recent EKG from ER, no signs of ischemia on previous EKG.   -I have recommended that she see a cardiologist and I have placed a referral for this.  She requests Dr. Humphrey Rolls in [facility] who is the former cardiologist of her  late husband.   -Discussed that if she has worsening chest pain or increasing shortness of breath she should contact 911.

## 2019-07-16 LAB — CBC
HCT: 38.7 % (ref 36.0–46.0)
Hemoglobin: 13.1 g/dL (ref 12.0–15.0)
MCHC: 33.8 g/dL (ref 30.0–36.0)
MCV: 93.2 fl (ref 78.0–100.0)
Platelets: 172 10*3/uL (ref 150.0–400.0)
RBC: 4.16 Mil/uL (ref 3.87–5.11)
RDW: 11.8 % (ref 11.5–15.5)
WBC: 5.4 10*3/uL (ref 4.0–10.5)

## 2019-07-16 LAB — BASIC METABOLIC PANEL
BUN: 10 mg/dL (ref 6–23)
CO2: 28 mEq/L (ref 19–32)
Calcium: 9.6 mg/dL (ref 8.4–10.5)
Chloride: 103 mEq/L (ref 96–112)
Creatinine, Ser: 0.75 mg/dL (ref 0.40–1.20)
GFR: 75.37 mL/min (ref >60.00)
Glucose, Bld: 88 mg/dL (ref 70–99)
Potassium: 4.2 mEq/L (ref 3.5–5.1)
Sodium: 139 mEq/L (ref 135–145)

## 2019-07-19 ENCOUNTER — Other Ambulatory Visit: Payer: Self-pay

## 2019-07-19 ENCOUNTER — Ambulatory Visit (INDEPENDENT_AMBULATORY_CARE_PROVIDER_SITE_OTHER): Payer: Medicare Other | Admitting: Internal Medicine

## 2019-07-19 ENCOUNTER — Encounter: Payer: Self-pay | Admitting: Internal Medicine

## 2019-07-19 VITALS — BP 149/91 | HR 76 | Ht 61.0 in | Wt 180.0 lb

## 2019-07-19 DIAGNOSIS — R0789 Other chest pain: Secondary | ICD-10-CM

## 2019-07-19 DIAGNOSIS — R55 Syncope and collapse: Secondary | ICD-10-CM | POA: Diagnosis not present

## 2019-07-19 NOTE — Progress Notes (Signed)
New Outpatient Visit Date: 07/19/2019  Referring Provider: Luetta Nutting, [address],  [place] [address]  Chief Complaint: Dizziness and passing out  HPI:  Ms. Nuckolls is a 73 y.o. female who is being seen today for the evaluation of syncope at the request of Luetta Nutting, DO. She has a history of hyperlipidemia, hiatal hernia, and GERD.  She had three episodes of marked orthostatic lightheadedness with transient loss of consciousness when sitting up after a DEXA scan with her gynecologist on 07/01/2019.  She initially went to urgent care and was found to be tachycardic; two further brief syncopal episodes occurred there as well.  She was subsequently seen in the ED, where her symptoms were attributed to tachycardia.  She was seen in follow-up by Dr. Zigmund Daniel on 07/15/2019, at which time she denied further episodes of presyncope/syncope.  However, she reported chest tightness radiating to the left arm as well as exertional dyspnea when carrying groceries or walking uphill.  Today, Ms. Sima reports that she continues have frequent dizziness (combination of feeling lightheaded and off balance).  She did have this prior to her syncopal episodes.  There were no prodromal symptoms before she passed out earlier this month.  She has been seen by Dr. Pryor Ochoa (ENT) and was put on a course of prednisone.  She continues to note some sinus congestion as well as sore throat.  She denies fevers, chills, chest pain, and shortness of breath.  He endorses occasional nausea with the dizziness.  She has a long history of dependent edema, which is stable.  Ms. Bolduc also reports chest heaviness and left arm pain when she walks uphill or does other strenuous activities.  She has noticed this, along with exertional dyspnea when using her exercise equipment, over the last month or two.  She has not had any recent travel or immobilization.  She denies a history of DVT/PE as well as malignancy.   --------------------------------------------------------------------------------------------------  Cardiovascular History & Procedures: Cardiovascular Problems:  Syncope  Chest pain  Risk Factors:  Hyperlipidemia, obesity, and age > 57  Cath/PCI:  None  CV Surgery:  None  EP Procedures and Devices:  None  Non-Invasive Evaluation(s):  Exercise tolerance test (07/05/2013): Normal study without ischemia.  Exercised 7 minutes.  Recent CV Pertinent Labs: Lab Results  Component Value Date   CHOL 163 08/12/2018   HDL 47.70 08/12/2018   LDLCALC 92 08/12/2018   TRIG 117.0 08/12/2018   CHOLHDL 3 08/12/2018   K 4.2 07/15/2019   K 4.2 06/07/2013   BUN 10 07/15/2019   BUN 13 06/07/2013   CREATININE 0.75 07/15/2019   CREATININE 0.77 06/07/2013    --------------------------------------------------------------------------------------------------  Past Medical History:  Diagnosis Date  . Fatty liver 2010  . GERD (gastroesophageal reflux disease)   . H. pylori infection   . Hemangioma 2010  . Hiatal hernia   . Hyperlipidemia     Past Surgical History:  Procedure Laterality Date  . CHOLECYSTECTOMY    . KNEE SURGERY  06/29/2007   Meniscus tear, left knee surgery    Current Meds  Medication Sig  . Ascorbic Acid (VITAMIN C) 500 MG tablet Take 500 mg by mouth daily.    Marland Kitchen aspirin EC 81 MG tablet Take 81 mg by mouth daily.  . Calcium Carbonate-Vitamin D (CALCIUM 600/VITAMIN D PO) Take 1 tablet by mouth 2 (two) times daily.  . Cholecalciferol ([facility] HIGH POTENCY VITAMIN D) 1000 UNITS tablet Take 1,000 Units by mouth daily.    Marland Kitchen  fish oil-omega-3 fatty acids 1000 MG capsule Take 2 g by mouth daily.   Marland Kitchen loratadine (CLARITIN) 10 MG tablet Take 10 mg by mouth daily as needed for allergies.   . NONFORMULARY OR COMPOUNDED ITEM Achilles Tendonitis Cream[facility] 2 refills  . pantoprazole (PROTONIX) 40 MG tablet TAKE 1 TABLET(40 MG) BY MOUTH DAILY (Patient taking  differently: Take 40 mg by mouth daily. )  . predniSONE (DELTASONE) 10 MG tablet as directed.  . simvastatin (ZOCOR) 40 MG tablet Take 1 tablet (40 mg total) by mouth daily.  . vitamin E 400 UNIT capsule Take 400 Units by mouth daily.     Allergies: Dexilant [dexlansoprazole] and Sulfonamide derivatives  Social History   Tobacco Use  . Smoking status: Never Smoker  . Smokeless tobacco: Never Used  Substance Use Topics  . Alcohol use: No  . Drug use: No    Family History  Problem Relation Age of Onset  . Hypertension Sister   . Hypertension Brother   . Hypertension Sister   . Colon cancer Neg Hx     Review of Systems: A 12-system review of systems was performed and was negative except as noted in the HPI.  --------------------------------------------------------------------------------------------------  Physical Exam: BP (!) 149/91 (BP Location: Right Arm, Patient Position: Sitting, Cuff Size: Normal)   Pulse 76   Ht 5\' 1"  (1.549 m)   Wt 180 lb (81.6 kg)   SpO2 98%   BMI 34.01 kg/m   General:  NAD HEENT: No conjunctival pallor or scleral icterus. Moist mucous membranes. OP clear. Neck: Supple without lymphadenopathy, thyromegaly, JVD, or HJR. No carotid bruit. Lungs: Normal work of breathing. Clear to auscultation bilaterally without wheezes or crackles. Heart: Regular rate and rhythm without murmurs, rubs, or gallops. Non-displaced PMI. Abd: Bowel sounds present. Soft, NT/ND without hepatosplenomegaly Ext: Trace to 1+ calf edema, left > right. Radial, PT, and DP pulses are 2+ bilaterally Skin: Warm and dry without rash. Neuro: CNIII-XII intact. Strength and fine-touch sensation intact in upper and lower extremities bilaterally. Psych: Normal mood and affect.  EKG:  NSR with poor R wave progression and inferior Q waves.  Since prior tracing on 07/01/2019, septal and inferior Q waves are now present.  Heart rate has also decreased.  Lab Results  Component Value Date    WBC 5.4 07/15/2019   HGB 13.1 07/15/2019   HCT 38.7 07/15/2019   MCV 93.2 07/15/2019   PLT 172.0 07/15/2019    Lab Results  Component Value Date   NA 139 07/15/2019   K 4.2 07/15/2019   CL 103 07/15/2019   CO2 28 07/15/2019   BUN 10 07/15/2019   CREATININE 0.75 07/15/2019   GLUCOSE 88 07/15/2019   ALT 10 08/12/2018    Lab Results  Component Value Date   CHOL 163 08/12/2018   HDL 47.70 08/12/2018   LDLCALC 92 08/12/2018   TRIG 117.0 08/12/2018   CHOLHDL 3 08/12/2018     --------------------------------------------------------------------------------------------------  ASSESSMENT AND PLAN: Syncope: Episodes were orthostatic in nature in the setting of dehydration, though recurrent episodes of complete loss of consciousness are quite profound.  Question if there is some underlying autonomic dysfunction.  I have recommended that we begin with a transthoracic echocardiogram.  We will also need ischemia evaluation, as outlined below, given EKG abnormalities and exertional symptoms.  Ms. Goossen should continue her ongoing ENT evaluation.  Positional nature argues against primary arrhythmia causing syncope.  Chest pain: Ms. Doolin reports a exertional chest tightness, left arm pain,  and dyspnea over the last few months.  She has not had symptoms at rest.  EKG today shows findings of possible septal and inferior infarct, which were not evident on prior tracing earlier this month.  We will begin with a transthoracic echocardiogram; based on results, we will discuss optimal evaluation for ischemic heart disease (cath versus noninvasive testing).  In the meantime, we will continue current medications.  I have advised Ms. Saye to refrain from doing strenuous activities and to seek immediate medical attention should she have recurrent/persistent chest pain.  Hyperlipidemia: Continue simvastatin pending aforementioned cardiac evaluation.  If there is evidence of significant  ASCVD, we will need to consider escalation to high-intensity statin therapy with target LDL < 70.  Follow-up: RTC 1 month.  Nelva Bush, MD 07/20/2019 9:00 PM

## 2019-07-19 NOTE — Patient Instructions (Signed)
Medication Instructions:  Your physician recommends that you continue on your current medications as directed. Please refer to the Current Medication list given to you today.  If you need a refill on your cardiac medications before your next appointment, please call your pharmacy.   Lab work: NONE  If you have labs (blood work) drawn today and your tests are completely normal, you will receive your results only by: Marland Kitchen MyChart Message (if you have MyChart) OR . A paper copy in the mail If you have any lab test that is abnormal or we need to change your treatment, we will call you to review the results.  Testing/Procedures: Your physician has requested that you have an echocardiogram. Echocardiography is a painless test that uses sound waves to create images of your heart. It provides your doctor with information about the size and shape of your heart and how well your heart's chambers and valves are working. This procedure takes approximately one hour. There are no restrictions for this procedure. You may get an IV, if needed, to receive an ultrasound enhancing agent through to better visualize your heart.     Follow-Up: At [facility], you and your health needs are our priority.  As part of our continuing mission to provide you with exceptional heart care, we have created designated Provider Care Teams.  These Care Teams include your primary Cardiologist (physician) and Advanced Practice Providers (APPs -  Physician Assistants and Nurse Practitioners) who all work together to provide you with the care you need, when you need it. You will need a follow up appointment in 1 months.   You may see DR Harrell Gave END or one of the following Advanced Practice Providers on your designated Care Team:   Murray Hodgkins, NP Christell Faith, PA-C . Marrianne Mood, PA-C   Echocardiogram An echocardiogram is a procedure that uses painless sound waves (ultrasound) to produce an image of the heart.  Images from an echocardiogram can provide important information about:  Signs of coronary artery disease (CAD).  Aneurysm detection. An aneurysm is a weak or damaged part of an artery wall that bulges out from the normal force of blood pumping through the body.  Heart size and shape. Changes in the size or shape of the heart can be associated with certain conditions, including heart failure, aneurysm, and CAD.  Heart muscle function.  Heart valve function.  Signs of a past heart attack.  Fluid buildup around the heart.  Thickening of the heart muscle.  A tumor or infectious growth around the heart valves. Tell a health care provider about:  Any allergies you have.  All medicines you are taking, including vitamins, herbs, eye drops, creams, and over-the-counter medicines.  Any blood disorders you have.  Any surgeries you have had.  Any medical conditions you have.  Whether you are pregnant or may be pregnant. What are the risks? Generally, this is a safe procedure. However, problems may occur, including:  Allergic reaction to dye (contrast) that may be used during the procedure. What happens before the procedure? No specific preparation is needed. You may eat and drink normally. What happens during the procedure?   An IV tube may be inserted into one of your veins.  You may receive contrast through this tube. A contrast is an injection that improves the quality of the pictures from your heart.  A gel will be applied to your chest.  A wand-like tool (transducer) will be moved over your chest. The gel will help  to transmit the sound waves from the transducer.  The sound waves will harmlessly bounce off of your heart to allow the heart images to be captured in real-time motion. The images will be recorded on a computer. The procedure may vary among health care providers and hospitals. What happens after the procedure?  You may return to your normal, everyday life,  including diet, activities, and medicines, unless your health care provider tells you not to do that. Summary  An echocardiogram is a procedure that uses painless sound waves (ultrasound) to produce an image of the heart.  Images from an echocardiogram can provide important information about the size and shape of your heart, heart muscle function, heart valve function, and fluid buildup around your heart.  You do not need to do anything to prepare before this procedure. You may eat and drink normally.  After the echocardiogram is completed, you may return to your normal, everyday life, unless your health care provider tells you not to do that. This information is not intended to replace advice given to you by your health care provider. Make sure you discuss any questions you have with your health care provider. Document Released: 10/11/2000 Document Revised: 02/04/2019 Document Reviewed: 11/16/2016 Elsevier Patient Education  2020 [employer].

## 2019-07-20 ENCOUNTER — Encounter: Payer: Self-pay | Admitting: Internal Medicine

## 2019-07-28 ENCOUNTER — Other Ambulatory Visit: Payer: Self-pay

## 2019-07-28 ENCOUNTER — Ambulatory Visit (HOSPITAL_COMMUNITY): Payer: Medicare Other | Attending: Cardiovascular Disease

## 2019-07-28 DIAGNOSIS — R55 Syncope and collapse: Secondary | ICD-10-CM

## 2019-07-28 DIAGNOSIS — R0789 Other chest pain: Secondary | ICD-10-CM | POA: Diagnosis not present

## 2019-07-29 DIAGNOSIS — F419 Anxiety disorder, unspecified: Secondary | ICD-10-CM | POA: Insufficient documentation

## 2019-08-03 ENCOUNTER — Telehealth: Payer: Self-pay | Admitting: *Deleted

## 2019-08-03 DIAGNOSIS — R079 Chest pain, unspecified: Secondary | ICD-10-CM

## 2019-08-03 NOTE — Telephone Encounter (Signed)
Results called to pt. Pt verbalized understanding. Patient agreeable to the [facility] stress test.  She verbalized understanding of the preprocedural instructions below. Also posted to MyChart. Message sent to precert.    Lamar  Your caregiver has ordered a Stress Test with nuclear imaging. The purpose of this test is to evaluate the blood supply to your heart muscle. This procedure is referred to as a "Non-Invasive Stress Test." This is because other than having an IV started in your vein, nothing is inserted or "invades" your body. Cardiac stress tests are done to find areas of poor blood flow to the heart by determining the extent of coronary artery disease (CAD). Some patients exercise on a treadmill, which naturally increases the blood flow to your heart, while others who are  unable to walk on a treadmill due to physical limitations have a pharmacologic/chemical stress agent called Lexiscan . This medicine will mimic walking on a treadmill by temporarily increasing your coronary blood flow.   Please note: these test may take anywhere between 2-4 hours to complete  PLEASE REPORT TO [facility] AT THE FIRST DESK WILL DIRECT YOU WHERE TO GO  Date of Procedure:_____10/09/2020____________  Arrival Time for Procedure:_______08:15 am________   PLEASE NOTIFY THE OFFICE AT LEAST 24 HOURS IN ADVANCE IF YOU ARE UNABLE TO KEEP YOUR APPOINTMENT.  [phone] AND  PLEASE NOTIFY NUCLEAR MEDICINE AT [facility] AT LEAST 24 HOURS IN ADVANCE IF YOU ARE UNABLE TO KEEP YOUR APPOINTMENT. [phone]  How to prepare for your Myoview test:  1. Do not eat or drink after midnight 2. No caffeine for 24 hours prior to test 3. No smoking 24 hours prior to test. 4. Your medication may be taken with water.  If your doctor stopped a medication because of this test, do not take that medication. 5. Ladies, please do not wear dresses.  Skirts or pants are appropriate.  Please wear a short sleeve shirt. 6. No perfume, cologne or lotion. 7. Wear comfortable walking shoes.

## 2019-08-03 NOTE — Telephone Encounter (Signed)
-----   Message from Nelva Bush, MD sent at 07/30/2019  6:54 AM EDT ----- Please let Ms. Mersereau know that her echocardiogram does not show any significant abnormalities.  I recommend that we proceed with a pharmacologic myocardial perfusion stress test at her convenience for evaluation of her chest discomfort and syncope.  We should follow-up shortly after completion of the stress test.

## 2019-08-06 ENCOUNTER — Other Ambulatory Visit: Payer: Self-pay

## 2019-08-06 ENCOUNTER — Ambulatory Visit
Admission: RE | Admit: 2019-08-06 | Discharge: 2019-08-06 | Disposition: A | Discharge status: Home / Self Care | Payer: Medicare Other | Source: Ambulatory Visit | Attending: Internal Medicine | Admitting: Internal Medicine

## 2019-08-06 DIAGNOSIS — R079 Chest pain, unspecified: Secondary | ICD-10-CM | POA: Insufficient documentation

## 2019-08-06 LAB — NM MYOCAR MULTI W/SPECT W/WALL MOTION / EF
LV dias vol: 44 mL (ref 46–106)
LV sys vol: 12 mL
Peak HR: 127 {beats}/min
Percent HR: 86 %
Rest HR: 95 {beats}/min
SDS: 2
SRS: 2
SSS: 1
TID: 0.9

## 2019-08-06 MED ORDER — TECHNETIUM TC 99M TETROFOSMIN IV KIT
10.8200 | PACK | Freq: Once | INTRAVENOUS | Status: AC | PRN
  Administered 2019-08-06: 10.82 via INTRAVENOUS

## 2019-08-06 MED ORDER — TECHNETIUM TC 99M TETROFOSMIN IV KIT
30.0000 | PACK | Freq: Once | INTRAVENOUS | Status: AC | PRN
  Administered 2019-08-06: 09:00:00 30.724 via INTRAVENOUS

## 2019-08-06 MED ORDER — REGADENOSON 0.4 MG/5ML IV SOLN
0.4000 mg | Freq: Once | INTRAVENOUS | Status: AC
  Administered 2019-08-06: 0.4 mg via INTRAVENOUS

## 2019-08-17 ENCOUNTER — Other Ambulatory Visit: Payer: Medicare Other

## 2019-08-18 ENCOUNTER — Other Ambulatory Visit: Payer: Self-pay | Admitting: Otolaryngology

## 2019-08-18 ENCOUNTER — Encounter: Payer: Self-pay | Admitting: Internal Medicine

## 2019-08-18 ENCOUNTER — Ambulatory Visit (INDEPENDENT_AMBULATORY_CARE_PROVIDER_SITE_OTHER): Payer: Medicare Other | Admitting: Internal Medicine

## 2019-08-18 ENCOUNTER — Other Ambulatory Visit: Payer: Self-pay

## 2019-08-18 VITALS — BP 158/92 | HR 96 | Ht 61.0 in | Wt 180.5 lb

## 2019-08-18 DIAGNOSIS — I10 Essential (primary) hypertension: Secondary | ICD-10-CM

## 2019-08-18 DIAGNOSIS — R9431 Abnormal electrocardiogram [ECG] [EKG]: Secondary | ICD-10-CM | POA: Diagnosis not present

## 2019-08-18 DIAGNOSIS — R42 Dizziness and giddiness: Secondary | ICD-10-CM

## 2019-08-18 NOTE — Progress Notes (Signed)
Follow-up Outpatient Visit Date: 08/18/2019  Primary Care Provider: Lucille Passy, MD [place] [id number]  Chief Complaint: Dizziness  HPI:  Leah Melendez is a 73 y.o. year-old female with history of hyperlipidemia, hiatal hernia, and GERD, who presents for follow-up of syncope.  I met her last month after multiple episodes of orthostatic lightheadedness and transient loss of consciousness after undergoing DEXA scan.  At the time of our meeting in September, she continued to have frequent episodes of dizziness.  She had been put on prednisone by Dr. Pryor Ochoa.  Subsequent echo showed preserved LVEF with grade 1 diastolic dysfunction.  No significant valvular disease was identified.  Myocardial perfusion stress test was low risk without ischemia or scar.  Mild aortic atherosclerosis was noted.  Today, Leah Melendez continues to have some dizziness, similar to our last visit.  She was recently seen again by Dr. Pryor Ochoa, who is concerned that she may have a central process contributing to her dizziness.  MRI of the brain has been ordered.  Leah Melendez denies lightheadedness, as well as palpitations and shortness of breath.  She feels like she gets short of breath quickly with modest activity.  She recently took low-dose alprazolam and feels like this may have made her feel better in general.  --------------------------------------------------------------------------------------------------  Cardiovascular History & Procedures: Cardiovascular Problems:  Syncope  Chest pain  Risk Factors:  Hyperlipidemia, obesity, and age > 80  Cath/PCI:  None  CV Surgery:  None  EP Procedures and Devices:  None  Non-Invasive Evaluation(s):  Pharmacologic myocardial perfusion stress test (08/06/2019): Low risk study without ischemia or scar.  LVEF 87%.  Mild aortic atherosclerosis.  TTE (07/28/2019): Normal LV size and wall thickness.  LVEF 55-60%.  Grade 1  diastolic dysfunction with elevated filling pressure.  Normal RV size and function.  No significant valvular abnormality.  Exercise tolerance test (07/05/2013): Normal study without ischemia.  Exercised 7 minutes.  Recent CV Pertinent Labs: Lab Results  Component Value Date   CHOL 163 08/12/2018   HDL 47.70 08/12/2018   LDLCALC 92 08/12/2018   TRIG 117.0 08/12/2018   CHOLHDL 3 08/12/2018   K 4.2 07/15/2019   K 4.2 06/07/2013   BUN 10 07/15/2019   BUN 13 06/07/2013   CREATININE 0.75 07/15/2019   CREATININE 0.77 06/07/2013    Past medical and surgical history were reviewed and updated in EPIC.  Current Meds  Medication Sig  . ALPRAZolam (XANAX) 0.25 MG tablet Take 0.5 mg by mouth 2 (two) times daily as needed.   . Ascorbic Acid (VITAMIN C) 500 MG tablet Take 500 mg by mouth daily.    Marland Kitchen aspirin EC 81 MG tablet Take 81 mg by mouth daily.  . Calcium Carbonate-Vitamin D (CALCIUM 600/VITAMIN D PO) Take 1 tablet by mouth 2 (two) times daily.  . fish oil-omega-3 fatty acids 1000 MG capsule Take 2 g by mouth daily.   Marland Kitchen loratadine (CLARITIN) 10 MG tablet Take 10 mg by mouth daily as needed for allergies.   . NONFORMULARY OR COMPOUNDED ITEM Achilles Tendonitis Cream[facility] 2 refills  . pantoprazole (PROTONIX) 40 MG tablet TAKE 1 TABLET(40 MG) BY MOUTH DAILY (Patient taking differently: Take 40 mg by mouth daily. )  . simvastatin (ZOCOR) 40 MG tablet Take 1 tablet (40 mg total) by mouth daily.  . vitamin E 400 UNIT capsule Take 400 Units by mouth daily.     Allergies: Dexilant [dexlansoprazole] and Sulfonamide derivatives  Social History  Tobacco Use  . Smoking status: Never Smoker  . Smokeless tobacco: Never Used  Substance Use Topics  . Alcohol use: No  . Drug use: No    Family History  Problem Relation Age of Onset  . Hypertension Sister   . Hypertension Brother   . Hypertension Sister   . Colon cancer Neg Hx     Review of Systems: A 12-system review of  systems was performed and was negative except as noted in the HPI.  --------------------------------------------------------------------------------------------------  Physical Exam: BP (!) 158/92 (BP Location: Left Arm, Patient Position: Sitting, Cuff Size: Normal)   Pulse 96   Ht '5\' 1"'  (1.549 m)   Wt 180 lb 8 oz (81.9 kg)   SpO2 98%   BMI 34.11 kg/m   General:  NAD. HEENT: No conjunctival pallor or scleral icterus. Facemask in place. Neck: Supple without lymphadenopathy, thyromegaly, JVD, or HJR. Lungs: Normal work of breathing. Clear to auscultation bilaterally without wheezes or crackles. Heart: Regular rate and rhythm without murmurs, rubs, or gallops. Non-displaced PMI. Abd: Bowel sounds present. Soft, NT/ND without hepatosplenomegaly Ext: No lower extremity edema. Radial, PT, and DP pulses are 2+ bilaterally. Skin: Warm and dry without rash.  EKG:  NSR with inferior Q waves and poor R wave progression.  No significant change since 07/19/2019.  Lab Results  Component Value Date   WBC 5.4 07/15/2019   HGB 13.1 07/15/2019   HCT 38.7 07/15/2019   MCV 93.2 07/15/2019   PLT 172.0 07/15/2019    Lab Results  Component Value Date   NA 139 07/15/2019   K 4.2 07/15/2019   CL 103 07/15/2019   CO2 28 07/15/2019   BUN 10 07/15/2019   CREATININE 0.75 07/15/2019   GLUCOSE 88 07/15/2019   ALT 10 08/12/2018    Lab Results  Component Value Date   CHOL 163 08/12/2018   HDL 47.70 08/12/2018   LDLCALC 92 08/12/2018   TRIG 117.0 08/12/2018   CHOLHDL 3 08/12/2018    --------------------------------------------------------------------------------------------------  ASSESSMENT AND PLAN: Dizziness: Quality today is more consistent with vertigo than a cardiogenic/arrhythmic etiology.  We have agreed to obtain carotid Dopplers to exclude significant cerebrovascular disease, as vertibrobasilar insufficiency is a potential though unlikely cause of positional dizziness.  I agree with  MRI ordered by Dr. Pryor Ochoa.  We have agreed to defer cardiac event monitoring at this time.  No medication changes at this time.  Abnormal EKG: EKG today again shows poor R wave progression and inferior Q-waves.  However, recent echo and myocardial perfusion stress test did not show evidence of ischemia/MI.  We will defer further workup for ischemic heart disease at this time.  Hypertension: BP moderately elevated today, though Ms. Bezek states that her blood pressure is typically lower.  Review of readings over the last 2 years indicates that most BP's are upper normal to elevated.  We discussed the role of pharmacotherapy in blood pressure control but have agreed to defer this for now.  I have advised Ms. Falin to limit her sodium intake.  If her blood pressure remains elevated at follow-up, it may be worthwhile to consider addition of a beta-blocker for blood pressure control as well as to help lower her heart rate.  Follow-up: Return to clinic in 1 month.  Nelva Bush, MD 08/19/2019 8:32 PM

## 2019-08-18 NOTE — Patient Instructions (Signed)
Medication Instructions:  Your physician recommends that you continue on your current medications as directed. Please refer to the Current Medication list given to you today.  *If you need a refill on your cardiac medications before your next appointment, please call your pharmacy*  Lab Work: none If you have labs (blood work) drawn today and your tests are completely normal, you will receive your results only by: Marland Kitchen MyChart Message (if you have MyChart) OR . A paper copy in the mail If you have any lab test that is abnormal or we need to change your treatment, we will call you to review the results.  Testing/Procedures: Your physician has requested that you have a carotid duplex. This test is an ultrasound of the carotid arteries in your neck. It looks at blood flow through these arteries that supply the brain with blood. Allow one hour for this exam. There are no restrictions or special instructions.   Follow-Up: At [facility], you and your health needs are our priority.  As part of our continuing mission to provide you with exceptional heart care, we have created designated Provider Care Teams.  These Care Teams include your primary Cardiologist (physician) and Advanced Practice Providers (APPs -  Physician Assistants and Nurse Practitioners) who all work together to provide you with the care you need, when you need it.  Your next appointment:   1 month.  The format for your next appointment:   In Person  Provider:    You may see DR Harrell Gave END or one of the following Advanced Practice Providers on your designated Care Team:    Murray Hodgkins, NP  Christell Faith, PA-C  Marrianne Mood, PA-C

## 2019-08-19 ENCOUNTER — Encounter: Payer: Self-pay | Admitting: Internal Medicine

## 2019-08-19 DIAGNOSIS — R9431 Abnormal electrocardiogram [ECG] [EKG]: Secondary | ICD-10-CM

## 2019-08-19 DIAGNOSIS — R42 Dizziness and giddiness: Secondary | ICD-10-CM | POA: Insufficient documentation

## 2019-08-19 DIAGNOSIS — I10 Essential (primary) hypertension: Secondary | ICD-10-CM | POA: Insufficient documentation

## 2019-08-19 HISTORY — DX: Dizziness and giddiness: R42

## 2019-08-19 HISTORY — DX: Abnormal electrocardiogram (ECG) (EKG): R94.31

## 2019-08-31 ENCOUNTER — Other Ambulatory Visit: Payer: Self-pay

## 2019-08-31 ENCOUNTER — Ambulatory Visit
Admission: RE | Admit: 2019-08-31 | Discharge: 2019-08-31 | Disposition: A | Discharge status: Home / Self Care | Payer: Medicare Other | Source: Ambulatory Visit | Attending: Otolaryngology | Admitting: Otolaryngology

## 2019-08-31 DIAGNOSIS — R42 Dizziness and giddiness: Secondary | ICD-10-CM | POA: Insufficient documentation

## 2019-08-31 LAB — POCT I-STAT CREATININE: Creatinine, Ser: 0.7 mg/dL (ref 0.44–1.00)

## 2019-08-31 MED ORDER — GADOBUTROL 1 MMOL/ML IV SOLN
7.5000 mL | Freq: Once | INTRAVENOUS | Status: AC | PRN
  Administered 2019-08-31: 7.5 mL via INTRAVENOUS

## 2019-09-08 ENCOUNTER — Other Ambulatory Visit: Payer: Self-pay

## 2019-09-08 ENCOUNTER — Ambulatory Visit (INDEPENDENT_AMBULATORY_CARE_PROVIDER_SITE_OTHER): Payer: Medicare Other

## 2019-09-08 DIAGNOSIS — R42 Dizziness and giddiness: Secondary | ICD-10-CM | POA: Diagnosis not present

## 2019-09-10 ENCOUNTER — Ambulatory Visit (INDEPENDENT_AMBULATORY_CARE_PROVIDER_SITE_OTHER): Payer: Medicare Other | Admitting: Internal Medicine

## 2019-09-10 ENCOUNTER — Other Ambulatory Visit: Payer: Self-pay | Admitting: Internal Medicine

## 2019-09-10 ENCOUNTER — Encounter: Payer: Self-pay | Admitting: Internal Medicine

## 2019-09-10 ENCOUNTER — Other Ambulatory Visit: Payer: Self-pay

## 2019-09-10 VITALS — BP 116/82 | HR 130 | Temp 97.3°F | Ht 61.0 in | Wt 178.4 lb

## 2019-09-10 DIAGNOSIS — M8580 Other specified disorders of bone density and structure, unspecified site: Secondary | ICD-10-CM

## 2019-09-10 DIAGNOSIS — R000 Tachycardia, unspecified: Secondary | ICD-10-CM

## 2019-09-10 DIAGNOSIS — R55 Syncope and collapse: Secondary | ICD-10-CM | POA: Diagnosis not present

## 2019-09-10 DIAGNOSIS — E782 Mixed hyperlipidemia: Secondary | ICD-10-CM | POA: Diagnosis not present

## 2019-09-10 DIAGNOSIS — R600 Localized edema: Secondary | ICD-10-CM

## 2019-09-10 DIAGNOSIS — E559 Vitamin D deficiency, unspecified: Secondary | ICD-10-CM

## 2019-09-10 DIAGNOSIS — F411 Generalized anxiety disorder: Secondary | ICD-10-CM

## 2019-09-10 DIAGNOSIS — I890 Lymphedema, not elsewhere classified: Secondary | ICD-10-CM

## 2019-09-10 DIAGNOSIS — R7989 Other specified abnormal findings of blood chemistry: Secondary | ICD-10-CM

## 2019-09-10 DIAGNOSIS — J309 Allergic rhinitis, unspecified: Secondary | ICD-10-CM

## 2019-09-10 DIAGNOSIS — L309 Dermatitis, unspecified: Secondary | ICD-10-CM

## 2019-09-10 HISTORY — DX: Tachycardia, unspecified: R00.0

## 2019-09-10 HISTORY — DX: Lymphedema, not elsewhere classified: I89.0

## 2019-09-10 NOTE — Progress Notes (Signed)
No chief complaint on file.  New patient  1. C/o LOC 07/01/19 ob/gyn appt x 3 times in 1 setting and possibly another 2 more this day had w/u so far MRI negative saw ENT Dr. Pryor Ochoa and cardiology appt with Dr. Saunders Revel 10/01/2019. She reports prior to having sxs was having ear pain on right side which has since resolved and also had sob with incline walking and left arm pain  Neurology appt pending 11/09/19 with Dr. Manuella Ghazi  2. C/o chronic leg edema L>R  3. Anxiety not really taking xanax 0.25 mg bid prn  4. ST today 130 repeat manual 116 pt declines BB today toprol xl 25 mg qd will defer to upcoming cards appt    Review of Systems  Constitutional: Negative for weight loss.  HENT: Negative for hearing loss.   Eyes: Negative for blurred vision.  Respiratory: Positive for shortness of breath.   Cardiovascular: Positive for palpitations.  Gastrointestinal: Negative for abdominal pain.  Musculoskeletal: Negative for falls.  Skin: Negative for rash.  Neurological: Positive for loss of consciousness.  Psychiatric/Behavioral: The patient is nervous/anxious.    Past Medical History:  Diagnosis Date  . Anxiety   . Fatty liver 2010  . GERD (gastroesophageal reflux disease)   . H. pylori infection   . Hemangioma 2010  . Hiatal hernia   . Hyperlipidemia   . Liver hemangioma    Past Surgical History:  Procedure Laterality Date  . bilateral lens replacement    . CHOLECYSTECTOMY    . KNEE SURGERY  06/29/2007   Meniscus tear, left knee surgery   Family History  Problem Relation Age of Onset  . Hypertension Sister   . Hypertension Brother   . Hypertension Sister   . Heart attack Sister        44 y.o as of 09/10/19   . Colon cancer Neg Hx    Social History   Socioeconomic History  . Marital status: Widowed    Spouse name: Not on file  . Number of children: 2  . Years of education: Not on file  . Highest education level: Not on file  Occupational History  . Occupation: Retired Gaffer  Social Needs  . Financial resource strain: Not on file  . Food insecurity    Worry: Not on file    Inability: Not on file  . Transportation needs    Medical: Not on file    Non-medical: Not on file  Tobacco Use  . Smoking status: Never Smoker  . Smokeless tobacco: Never Used  Substance and Sexual Activity  . Alcohol use: No  . Drug use: No  . Sexual activity: Not Currently  Lifestyle  . Physical activity    Days per week: Not on file    Minutes per session: Not on file  . Stress: Not on file  Relationships  . Social Herbalist on phone: Not on file    Gets together: Not on file    Attends religious service: Not on file    Active member of club or organization: Not on file    Attends meetings of clubs or organizations: Not on file    Relationship status: Not on file  . Intimate partner violence    Fear of current or ex partner: Not on file    Emotionally abused: Not on file    Physically abused: Not on file    Forced sexual activity: Not on file  Other Topics Concern  .  Not on file  Social History Narrative   Has living will.  Widow as of 04/08/18.  Daughters have a copy of living will and [name] would be HPOA if husband was not able.   1 daughter lives in [place] has 4 kids and 1 daughter in [place] with 2 kids      Would desire CPR.  She would not want heroic measures.            Current Meds  Medication Sig  . ALPRAZolam (XANAX) 0.25 MG tablet Take 0.5 mg by mouth 2 (two) times daily as needed.   . Ascorbic Acid (VITAMIN C) 500 MG tablet Take 500 mg by mouth daily.    Marland Kitchen aspirin EC 81 MG tablet Take 81 mg by mouth daily.  Marland Kitchen azelastine (ASTELIN) 0.1 % nasal spray Place 2 sprays into both nostrils 2 (two) times daily. Use in each nostril as directed  . Calcium Carbonate-Vitamin D (CALCIUM 600/VITAMIN D PO) Take 1 tablet by mouth 2 (two) times daily.  . fish oil-omega-3 fatty acids 1000 MG capsule Take 2 g by mouth daily.   .  Fluocinolone Acetonide 0.01 % OIL INT 2 TO 4 GTS IN AU QHS FOR 4 DAYS THEN PRN DRY SKIN AND IRRITATION  . loratadine (CLARITIN) 10 MG tablet Take 10 mg by mouth daily as needed for allergies.   . NONFORMULARY OR COMPOUNDED ITEM Achilles Tendonitis Cream[facility] 2 refills  . pantoprazole (PROTONIX) 40 MG tablet TAKE 1 TABLET(40 MG) BY MOUTH DAILY (Patient taking differently: Take 40 mg by mouth daily. )  . simvastatin (ZOCOR) 40 MG tablet Take 1 tablet (40 mg total) by mouth daily.  . vitamin E 400 UNIT capsule Take 400 Units by mouth daily.    Allergies  Allergen Reactions  . Dexilant [Dexlansoprazole] Diarrhea  . Sulfonamide Derivatives     REACTION: as child   Recent Results (from the past 2160 hour(s))  HM DEXA SCAN     Status: None   Collection Time: 07/01/19 12:00 AM  Result Value Ref Range   HM Dexa Scan Osteopenia   Basic metabolic panel     Status: Abnormal   Collection Time: 07/01/19  6:35 PM  Result Value Ref Range   Sodium 139 135 - 145 mmol/L   Potassium 4.1 3.5 - 5.1 mmol/L   Chloride 106 98 - 111 mmol/L   CO2 23 22 - 32 mmol/L   Glucose, Bld 106 (H) 70 - 99 mg/dL   BUN 13 8 - 23 mg/dL   Creatinine, Ser 0.75 0.44 - 1.00 mg/dL   Calcium 9.4 8.9 - 10.3 mg/dL   GFR calc non Af Amer >60 >60 mL/min   GFR calc Af Amer >60 >60 mL/min   Anion gap 10 5 - 15    Comment: Performed at [facility], [address] [place], [place] [address]  CBC     Status: Abnormal   Collection Time: 07/01/19  6:35 PM  Result Value Ref Range   WBC 8.0 4.0 - 10.5 K/uL   RBC 4.55 3.87 - 5.11 MIL/uL   Hemoglobin 14.1 12.0 - 15.0 g/dL   HCT 41.6 36.0 - 46.0 %   MCV 91.4 80.0 - 100.0 fL   MCH 31.0 26.0 - 34.0 pg   MCHC 33.9 30.0 - 36.0 g/dL   RDW 11.4 (L) 11.5 - 15.5 %   Platelets 170 150 - 400 K/uL   nRBC 0.0 0.0 - 0.2 %  Comment: Performed at [facility], [address] [place], [place] [address]  Urinalysis, Complete w Microscopic     Status: Abnormal    Collection Time: 07/01/19  6:35 PM  Result Value Ref Range   Color, Urine YELLOW (A) YELLOW   APPearance CLEAR (A) CLEAR   Specific Gravity, Urine 1.008 1.005 - 1.030   pH 6.0 5.0 - 8.0   Glucose, UA NEGATIVE NEGATIVE mg/dL   Hgb urine dipstick SMALL (A) NEGATIVE   Bilirubin Urine NEGATIVE NEGATIVE   Ketones, ur NEGATIVE NEGATIVE mg/dL   Protein, ur NEGATIVE NEGATIVE mg/dL   Nitrite NEGATIVE NEGATIVE   Leukocytes,Ua MODERATE (A) NEGATIVE   RBC / HPF 0-5 0 - 5 RBC/hpf   WBC, UA 11-20 0 - 5 WBC/hpf   Bacteria, UA NONE SEEN NONE SEEN   Squamous Epithelial / LPF 0-5 0 - 5    Comment: Performed at [facility], [place], [place] [address]  Troponin I (High Sensitivity)     Status: None   Collection Time: 07/01/19  6:35 PM  Result Value Ref Range   Troponin I (High Sensitivity) 4 <18 ng/L    Comment: (NOTE) Elevated high sensitivity troponin I (hsTnI) values and significant  changes across serial measurements may suggest ACS but many other  chronic and acute conditions are known to elevate hsTnI results.  Refer to the "Links" section for chest pain algorithms and additional  guidance. Performed at [facility], [address] [place], [place] [address]   HM MAMMOGRAPHY     Status: None   Collection Time: 07/06/19 12:00 AM  Result Value Ref Range   HM Mammogram 0-4 Bi-Rad 0-4 Bi-Rad, Self Reported Normal    Comment: normal  Basic Metabolic Panel (BMET)     Status: None   Collection Time: 07/15/19  3:12 PM  Result Value Ref Range   Sodium 139 135 - 145 mEq/L   Potassium 4.2 3.5 - 5.1 mEq/L   Chloride 103 96 - 112 mEq/L   CO2 28 19 - 32 mEq/L   Glucose, Bld 88 70 - 99 mg/dL   BUN 10 6 - 23 mg/dL   Creatinine, Ser 0.75 0.40 - 1.20 mg/dL   Calcium 9.6 8.4 - 10.5 mg/dL   GFR 75.37 >60.00 mL/min  CBC     Status: None   Collection Time: 07/15/19  3:12 PM  Result Value Ref Range   WBC 5.4 4.0 - 10.5 K/uL   RBC 4.16 3.87 - 5.11 Mil/uL    Platelets 172.0 150.0 - 400.0 K/uL   Hemoglobin 13.1 12.0 - 15.0 g/dL   HCT 38.7 36.0 - 46.0 %   MCV 93.2 78.0 - 100.0 fl   MCHC 33.8 30.0 - 36.0 g/dL   RDW 11.8 11.5 - 15.5 %  NM Myocar Multi W/Spect W/Wall Motion / EF     Status: None   Collection Time: 08/06/19 10:23 AM  Result Value Ref Range   Rest HR 95 bpm   Rest BP 148/86 mmHg   Percent HR 86 %   Peak HR 127 bpm   Peak BP 161/84 mmHg   SSS 1    SRS 2    SDS 2    TID 0.90    LV sys vol 12 mL   LV dias vol 44 46 - 106 mL  I-STAT creatinine     Status: None   Collection Time: 08/31/19  1:01 PM  Result Value Ref Range   Creatinine,  Ser 0.70 0.44 - 1.00 mg/dL  D-Dimer, Quantitative     Status: Abnormal   Collection Time: 09/10/19  3:51 PM  Result Value Ref Range   D-Dimer, Quant 0.66 (H) <0.50 mcg/mL FEU    Comment: . The D-Dimer test is used frequently to exclude an acute PE or DVT. In patients with a low to moderate clinical risk assessment and a D-Dimer result <0.50 mcg/mL FEU, the likelihood of a PE or DVT is very low. However, a thromboembolic event should not be excluded solely on the basis of the D-Dimer level. Increased levels of D-Dimer are associated with a PE, DVT, DIC, malignancies, inflammation, sepsis, surgery, trauma, pregnancy, and advancing patient age. [Jama 2006 11:295(2):199-207] . For additional information, please refer to: http://education.questdiagnostics.com/faq/FAQ149 (This link is being provided for informational/ educational purposes only) .   Hepatic function panel     Status: None   Collection Time: 09/10/19  3:51 PM  Result Value Ref Range   Total Protein 7.2 6.1 - 8.1 g/dL   Albumin 4.4 3.6 - 5.1 g/dL   Globulin 2.8 1.9 - 3.7 g/dL (calc)   AG Ratio 1.6 1.0 - 2.5 (calc)   Total Bilirubin 1.0 0.2 - 1.2 mg/dL   Bilirubin, Direct 0.2 0.0 - 0.2 mg/dL   Indirect Bilirubin 0.8 0.2 - 1.2 mg/dL (calc)   Alkaline phosphatase (APISO) 62 37 - 153 U/L   AST 15 10 - 35 U/L   ALT 13 6 -  29 U/L  TSH     Status: None   Collection Time: 09/10/19  3:51 PM  Result Value Ref Range   TSH 1.87 0.40 - 4.50 mIU/L  Urinalysis, Routine w reflex microscopic     Status: Abnormal   Collection Time: 09/10/19  3:51 PM  Result Value Ref Range   Color, Urine YELLOW YELLOW   APPearance CLEAR CLEAR   Specific Gravity, Urine 1.005 1.001 - 1.03   pH 5.5 5.0 - 8.0   Glucose, UA NEGATIVE NEGATIVE   Bilirubin Urine NEGATIVE NEGATIVE   Ketones, ur NEGATIVE NEGATIVE   Hgb urine dipstick NEGATIVE NEGATIVE   Protein, ur NEGATIVE NEGATIVE   Nitrite NEGATIVE NEGATIVE   Leukocytes,Ua 1+ (A) NEGATIVE   WBC, UA 0-5 0 - 5 /HPF   RBC / HPF NONE SEEN 0 - 2 /HPF   Squamous Epithelial / LPF 0-5 < OR = 5 /HPF   Bacteria, UA NONE SEEN NONE SEEN /HPF   Hyaline Cast NONE SEEN NONE SEEN /LPF  Vitamin D (25 hydroxy)     Status: Abnormal   Collection Time: 09/10/19  3:51 PM  Result Value Ref Range   Vit D, 25-Hydroxy 18 (L) 30 - 100 ng/mL    Comment: Vitamin D Status         25-OH Vitamin D: . Deficiency:                    <20 ng/mL Insufficiency:             20 - 29 ng/mL Optimal:                 > or = 30 ng/mL . For 25-OH Vitamin D testing on patients on  D2-supplementation and patients for whom quantitation  of D2 and D3 fractions is required, the QuestAssureD(TM) 25-OH VIT D, (D2,D3), LC/MS/MS is recommended: order  code [id number] (patients >34yrs). See Note 1 . Note 1 . For additional information, please refer to  http://education.[facility].com/faq/FAQ199  (This link  is being provided for informational/ educational purposes only.)   Basic metabolic panel     Status: Abnormal   Collection Time: 09/11/19  6:11 AM  Result Value Ref Range   Sodium 143 135 - 145 mmol/L   Potassium 4.0 3.5 - 5.1 mmol/L   Chloride 110 98 - 111 mmol/L   CO2 23 22 - 32 mmol/L   Glucose, Bld 102 (H) 70 - 99 mg/dL   BUN 13 8 - 23 mg/dL   Creatinine, Ser 0.69 0.44 - 1.00 mg/dL   Calcium 9.5 8.9 - 10.3 mg/dL    GFR calc non Af Amer >60 >60 mL/min   GFR calc Af Amer >60 >60 mL/min   Anion gap 10 5 - 15    Comment: Performed at [facility], [address] [place], [place] [address]  CBC with Differential     Status: None   Collection Time: 09/11/19  6:11 AM  Result Value Ref Range   WBC 5.5 4.0 - 10.5 K/uL   RBC 4.28 3.87 - 5.11 MIL/uL   Hemoglobin 13.6 12.0 - 15.0 g/dL   HCT 39.3 36.0 - 46.0 %   MCV 91.8 80.0 - 100.0 fL   MCH 31.8 26.0 - 34.0 pg   MCHC 34.6 30.0 - 36.0 g/dL   RDW 11.6 11.5 - 15.5 %   Platelets 185 150 - 400 K/uL   nRBC 0.0 0.0 - 0.2 %   Neutrophils Relative % 60 %   Neutro Abs 3.3 1.7 - 7.7 K/uL   Lymphocytes Relative 30 %   Lymphs Abs 1.6 0.7 - 4.0 K/uL   Monocytes Relative 8 %   Monocytes Absolute 0.5 0.1 - 1.0 K/uL   Eosinophils Relative 2 %   Eosinophils Absolute 0.1 0.0 - 0.5 K/uL   Basophils Relative 0 %   Basophils Absolute 0.0 0.0 - 0.1 K/uL   Immature Granulocytes 0 %   Abs Immature Granulocytes 0.00 0.00 - 0.07 K/uL    Comment: Performed at [facility], [address] [place], [place] [address]  Lipid panel     Status: None   Collection Time: 09/13/19 11:54 AM  Result Value Ref Range   Cholesterol 156 0 - 200 mg/dL    Comment: ATP III Classification       Desirable:  < 200 mg/dL               Borderline High:  200 - 239 mg/dL          High:  > = 240 mg/dL   Triglycerides 124.0 0.0 - 149.0 mg/dL    Comment: Normal:  <150 mg/dLBorderline High:  150 - 199 mg/dL   HDL 47.00 >39.00 mg/dL   VLDL 24.8 0.0 - 40.0 mg/dL   LDL Cholesterol 85 0 - 99 mg/dL   Total CHOL/HDL Ratio 3     Comment:                Men          Women1/2 Average Risk     3.4          3.3Average Risk          5.0          4.42X Average Risk          9.6          7.13X Average Risk          15.0  11.0                       NonHDL 109.48     Comment: NOTE:  Non-HDL goal should be 30 mg/dL higher than patient's LDL goal (i.e. LDL goal of < 70 mg/dL, would have  non-HDL goal of < 100 mg/dL)   Objective  Body mass index is 33.71 kg/m. Wt Readings from Last 3 Encounters:  09/10/19 178 lb 6.4 oz (80.9 kg)  08/18/19 180 lb 8 oz (81.9 kg)  07/19/19 180 lb (81.6 kg)   Temp Readings from Last 3 Encounters:  09/11/19 98.3 F (36.8 C) (Oral)  09/10/19 (!) 97.3 F (36.3 C) (Skin)  07/15/19 97.9 F (36.6 C) (Oral)   BP Readings from Last 3 Encounters:  09/11/19 138/65  09/10/19 116/82  08/18/19 (!) 158/92   Pulse Readings from Last 3 Encounters:  09/11/19 65  09/10/19 (!) 130  08/18/19 96    Physical Exam Vitals signs and nursing note reviewed.  Constitutional:      Appearance: Normal appearance. She is well-developed and well-groomed. She is obese.     Comments: +mask on    HENT:     Head: Normocephalic and atraumatic.  Eyes:     Conjunctiva/sclera: Conjunctivae normal.     Pupils: Pupils are equal, round, and reactive to light.  Cardiovascular:     Rate and Rhythm: Regular rhythm. Tachycardia present.     Heart sounds: Normal heart sounds.  Pulmonary:     Effort: Pulmonary effort is normal.     Breath sounds: Normal breath sounds.  Skin:    General: Skin is warm and dry.  Neurological:     General: No focal deficit present.     Mental Status: She is alert and oriented to person, place, and time. Mental status is at baseline.     Gait: Gait normal.  Psychiatric:        Attention and Perception: Attention and perception normal.        Mood and Affect: Mood and affect normal.        Speech: Speech normal.        Behavior: Behavior normal. Behavior is cooperative.        Thought Content: Thought content normal.        Cognition and Memory: Cognition and memory normal.        Judgment: Judgment normal.     Assessment  Plan  Syncope, unspecified syncope type - Plan: D-Dimer, Quantitative r/o PE as cause [place] legs and PE study negative  , Hepatic function panel, TSH, Urinalysis, Routine w reflex microscopic F/u cards and  neurology as sch   Vitamin D deficiency - Plan: Vitamin D (25 hydroxy) rec D3 5000 IU qd   HYPERLIPIDEMIA, MIXED  Generalized anxiety disorder Not taking xanax 0.25 bid prn   Lymphedema chronic L>R leg   Sinus tachycardia  -rec BB Toprol xl 25 mg qd pt wants to disc with cards upcoming  Repeat manual HR 116   HM Flu shot utd [facility] 08/16/19  tdap consider future shingrix utd prevnar utd pna 23 consider in future   mammo normal 07/06/19 pfw  dexa 07/01/2019 osteopenia  Colonoscopy normal 07/25/11 Dr. Deatra Ina f/u in 10 years  Out of age window pap  Of note sister patsy lough also a pt   Dr. Pryor Ochoa ENT  PFW ob/gyn 07/01/2019  prevnar utd pna 23 due Tdap due  shingrix x 2  Provider: Dr. [name] McLean-Scocuzza-Internal  Medicine

## 2019-09-10 NOTE — Patient Instructions (Addendum)
Syncope  Syncope refers to a condition in which a person temporarily loses consciousness. Syncope may also be called fainting or passing out. It is caused by a sudden decrease in blood flow to the brain. Even though most causes of syncope are not dangerous, syncope can be a sign of a serious medical problem. Your health care provider may do tests to find the reason why you are having syncope. Signs that you may be about to faint include:  Feeling dizzy or light-headed.  Feeling nauseous.  Seeing all white or all black in your field of vision.  Having cold, clammy skin. If you faint, get medical help right away. Call your local emergency services (911 in the [place] Do not drive yourself to the hospital. Follow these instructions at home: Pay attention to any changes in your symptoms. Take these actions to stay safe and to help relieve your symptoms: Lifestyle  Do not drive, use machinery, or play sports until your health care provider says it is okay.  Do not drink alcohol.  Do not use any products that contain nicotine or tobacco, such as cigarettes and e-cigarettes. If you need help quitting, ask your health care provider.  Drink enough fluid to keep your urine pale yellow. General instructions  Take over-the-counter and prescription medicines only as told by your health care provider.  If you are taking blood pressure or heart medicine, get up slowly and take several minutes to sit and then stand. This can reduce dizziness or light-headedness.  Have someone stay with you until you feel stable.  If you start to feel like you might faint, lie down right away and raise (elevate) your feet above the level of your heart. Breathe deeply and steadily. Wait until all the symptoms have passed.  Keep all follow-up visits as told by your health care provider. This is important. Get help right away if you:  Have a severe headache.  Faint once or repeatedly.  Have pain in your chest,  abdomen, or back.  Have a very fast or irregular heartbeat (palpitations).  Have pain when you breathe.  Are bleeding from your mouth or rectum, or you have black or tarry stool.  Have a seizure.  Are confused.  Have trouble walking.  Have severe weakness.  Have vision problems. These symptoms may represent a serious problem that is an emergency. Do not wait to see if your symptoms will go away. Get medical help right away. Call your local emergency services (911 in the [place] Do not drive yourself to the hospital. Summary  Syncope refers to a condition in which a person temporarily loses consciousness. It is caused by a sudden decrease in blood flow to the brain.  Signs that you may be about to faint include dizziness, feeling light-headed, feeling nauseous, sudden vision changes, or cold, clammy skin.  Although most causes of syncope are not dangerous, syncope can be a sign of a serious medical problem. If you faint, get medical help right away. This information is not intended to replace advice given to you by your health care provider. Make sure you discuss any questions you have with your health care provider. Document Released: 10/14/2005 Document Revised: 09/26/2017 Document Reviewed: 09/22/2017 Elsevier Patient Education  2020 [place] for Gastroesophageal Reflux Disease, Adult When you have gastroesophageal reflux disease (GERD), the foods you eat and your eating habits are very important. Choosing the right foods can help ease the discomfort of GERD. Consider working with  a diet and nutrition specialist (dietitian) to help you make healthy food choices. What general guidelines should I follow?  Eating plan  Choose healthy foods low in fat, such as fruits, vegetables, whole grains, low-fat dairy products, and lean meat, fish, and poultry.  Eat frequent, small meals instead of three large meals each day. Eat your meals slowly, in a relaxed setting.  Avoid bending over or lying down until 2-3 hours after eating.  Limit high-fat foods such as fatty meats or fried foods.  Limit your intake of oils, butter, and shortening to less than 8 teaspoons each day.  Avoid the following: ? Foods that cause symptoms. These may be different for different people. Keep a food diary to keep track of foods that cause symptoms. ? Alcohol. ? Drinking large amounts of liquid with meals. ? Eating meals during the 2-3 hours before bed.  Cook foods using methods other than frying. This may include baking, grilling, or broiling. Lifestyle  Maintain a healthy weight. Ask your health care provider what weight is healthy for you. If you need to lose weight, work with your health care provider to do so safely.  Exercise for at least 30 minutes on 5 or more days each week, or as told by your health care provider.  Avoid wearing clothes that fit tightly around your waist and chest.  Do not use any products that contain nicotine or tobacco, such as cigarettes and e-cigarettes. If you need help quitting, ask your health care provider.  Sleep with the head of your bed raised. Use a wedge under the mattress or blocks under the bed frame to raise the head of the bed. What foods are not recommended? The items listed may not be a complete list. Talk with your dietitian about what dietary choices are best for you. Grains Pastries or quick breads with added fat. [place] toast. Vegetables Deep fried vegetables. [place] fries. Any vegetables prepared with added fat. Any vegetables that cause symptoms. For some people this may include tomatoes and tomato products, chili peppers, onions and garlic, and horseradish. Fruits Any fruits prepared with added fat. Any fruits that cause symptoms. For some people this may include citrus fruits, such as oranges, grapefruit, pineapple, and lemons. Meats and other protein foods High-fat meats, such as fatty beef or pork, hot dogs, ribs,  ham, sausage, salami and bacon. Fried meat or protein, including fried fish and fried chicken. Nuts and nut butters. Dairy Whole milk and chocolate milk. Sour cream. Cream. Ice cream. Cream cheese. Milk shakes. Beverages Coffee and tea, with or without caffeine. Carbonated beverages. Sodas. Energy drinks. Fruit juice made with acidic fruits (such as orange or grapefruit). Tomato juice. Alcoholic drinks. Fats and oils Butter. Margarine. Shortening. Ghee. Sweets and desserts Chocolate and cocoa. Donuts. Seasoning and other foods Pepper. Peppermint and spearmint. Any condiments, herbs, or seasonings that cause symptoms. For some people, this may include curry, hot sauce, or vinegar-based salad dressings. Summary  When you have gastroesophageal reflux disease (GERD), food and lifestyle choices are very important to help ease the discomfort of GERD.  Eat frequent, small meals instead of three large meals each day. Eat your meals slowly, in a relaxed setting. Avoid bending over or lying down until 2-3 hours after eating.  Limit high-fat foods such as fatty meat or fried foods. This information is not intended to replace advice given to you by your health care provider. Make sure you discuss any questions you have with your health care provider. Document  Released: 10/14/2005 Document Revised: 02/04/2019 Document Reviewed: 10/15/2016 Elsevier Patient Education  [place].    Gastroesophageal Reflux Disease, Adult Gastroesophageal reflux (GER) happens when acid from the stomach flows up into the tube that connects the mouth and the stomach (esophagus). Normally, food travels down the esophagus and stays in the stomach to be digested. However, when a person has GER, food and stomach acid sometimes move back up into the esophagus. If this becomes a more serious problem, the person may be diagnosed with a disease called gastroesophageal reflux disease (GERD). GERD occurs when the reflux:   Happens often.  Causes frequent or severe symptoms.  Causes problems such as damage to the esophagus. When stomach acid comes in contact with the esophagus, the acid may cause soreness (inflammation) in the esophagus. Over time, GERD may create small holes (ulcers) in the lining of the esophagus. What are the causes? This condition is caused by a problem with the muscle between the esophagus and the stomach (lower esophageal sphincter, or LES). Normally, the LES muscle closes after food passes through the esophagus to the stomach. When the LES is weakened or abnormal, it does not close properly, and that allows food and stomach acid to go back up into the esophagus. The LES can be weakened by certain dietary substances, medicines, and medical conditions, including:  Tobacco use.  Pregnancy.  Having a hiatal hernia.  Alcohol use.  Certain foods and beverages, such as coffee, chocolate, onions, and peppermint. What increases the risk? You are more likely to develop this condition if you:  Have an increased body weight.  Have a connective tissue disorder.  Use NSAID medicines. What are the signs or symptoms? Symptoms of this condition include:  Heartburn.  Difficult or painful swallowing.  The feeling of having a lump in the throat.  Abitter taste in the mouth.  Bad breath.  Having a large amount of saliva.  Having an upset or bloated stomach.  Belching.  Chest pain. Different conditions can cause chest pain. Make sure you see your health care provider if you experience chest pain.  Shortness of breath or wheezing.  Ongoing (chronic) cough or a night-time cough.  Wearing away of tooth enamel.  Weight loss. How is this diagnosed? Your health care provider will take a medical history and perform a physical exam. To determine if you have mild or severe GERD, your health care provider may also monitor how you respond to treatment. You may also have tests, including:   A test to examine your stomach and esophagus with a small camera (endoscopy).  A test thatmeasures the acidity level in your esophagus.  A test thatmeasures how much pressure is on your esophagus.  A barium swallow or modified barium swallow test to show the shape, size, and functioning of your esophagus. How is this treated? The goal of treatment is to help relieve your symptoms and to prevent complications. Treatment for this condition may vary depending on how severe your symptoms are. Your health care provider may recommend:  Changes to your diet.  Medicine.  Surgery. Follow these instructions at home: Eating and drinking   Follow a diet as recommended by your health care provider. This may involve avoiding foods and drinks such as: ? Coffee and tea (with or without caffeine). ? Drinks that containalcohol. ? Energy drinks and sports drinks. ? Carbonated drinks or sodas. ? Chocolate and cocoa. ? Peppermint and mint flavorings. ? Garlic and onions. ? Horseradish. ? Spicy and acidic  foods, including peppers, chili powder, curry powder, vinegar, hot sauces, and barbecue sauce. ? Citrus fruit juices and citrus fruits, such as oranges, lemons, and limes. ? Tomato-based foods, such as red sauce, chili, salsa, and pizza with red sauce. ? Fried and fatty foods, such as donuts, french fries, potato chips, and high-fat dressings. ? High-fat meats, such as hot dogs and fatty cuts of red and white meats, such as rib eye steak, sausage, ham, and bacon. ? High-fat dairy items, such as whole milk, butter, and cream cheese.  Eat small, frequent meals instead of large meals.  Avoid drinking large amounts of liquid with your meals.  Avoid eating meals during the 2-3 hours before bedtime.  Avoid lying down right after you eat.  Do not exercise right after you eat. Lifestyle   Do not use any products that contain nicotine or tobacco, such as cigarettes, e-cigarettes, and chewing  tobacco. If you need help quitting, ask your health care provider.  Try to reduce your stress by using methods such as yoga or meditation. If you need help reducing stress, ask your health care provider.  If you are overweight, reduce your weight to an amount that is healthy for you. Ask your health care provider for guidance about a safe weight loss goal. General instructions  Pay attention to any changes in your symptoms.  Take over-the-counter and prescription medicines only as told by your health care provider. Do not take aspirin, ibuprofen, or other NSAIDs unless your health care provider told you to do so.  Wear loose-fitting clothing. Do not wear anything tight around your waist that causes pressure on your abdomen.  Raise (elevate) the head of your bed about 6 inches (15 cm).  Avoid bending over if this makes your symptoms worse.  Keep all follow-up visits as told by your health care provider. This is important. Contact a health care provider if:  You have: ? New symptoms. ? Unexplained weight loss. ? Difficulty swallowing or it hurts to swallow. ? Wheezing or a persistent cough. ? A hoarse voice.  Your symptoms do not improve with treatment. Get help right away if you:  Have pain in your arms, neck, jaw, teeth, or back.  Feel sweaty, dizzy, or light-headed.  Have chest pain or shortness of breath.  Vomit and your vomit looks like blood or coffee grounds.  Faint.  Have stool that is bloody or black.  Cannot swallow, drink, or eat. Summary  Gastroesophageal reflux happens when acid from the stomach flows up into the esophagus. GERD is a disease in which the reflux happens often, causes frequent or severe symptoms, or causes problems such as damage to the esophagus.  Treatment for this condition may vary depending on how severe your symptoms are. Your health care provider may recommend diet and lifestyle changes, medicine, or surgery.  Contact a health care  provider if you have new or worsening symptoms.  Take over-the-counter and prescription medicines only as told by your health care provider. Do not take aspirin, ibuprofen, or other NSAIDs unless your health care provider told you to do so.  Keep all follow-up visits as told by your health care provider. This is important. This information is not intended to replace advice given to you by your health care provider. Make sure you discuss any questions you have with your health care provider. Document Released: 07/24/2005 Document Revised: 04/22/2018 Document Reviewed: 04/22/2018 Elsevier Patient Education  2020 [employer].

## 2019-09-11 ENCOUNTER — Emergency Department
Admission: EM | Admit: 2019-09-11 | Discharge: 2019-09-11 | Disposition: A | Discharge status: Home / Self Care | Payer: Medicare Other | Attending: Emergency Medicine | Admitting: Emergency Medicine

## 2019-09-11 ENCOUNTER — Emergency Department: Payer: Medicare Other

## 2019-09-11 ENCOUNTER — Telehealth: Payer: Self-pay | Admitting: Internal Medicine

## 2019-09-11 ENCOUNTER — Other Ambulatory Visit: Payer: Self-pay

## 2019-09-11 DIAGNOSIS — Z79899 Other long term (current) drug therapy: Secondary | ICD-10-CM | POA: Insufficient documentation

## 2019-09-11 DIAGNOSIS — Z7982 Long term (current) use of aspirin: Secondary | ICD-10-CM | POA: Diagnosis not present

## 2019-09-11 DIAGNOSIS — R791 Abnormal coagulation profile: Secondary | ICD-10-CM | POA: Insufficient documentation

## 2019-09-11 DIAGNOSIS — I10 Essential (primary) hypertension: Secondary | ICD-10-CM | POA: Insufficient documentation

## 2019-09-11 DIAGNOSIS — Z8679 Personal history of other diseases of the circulatory system: Secondary | ICD-10-CM | POA: Insufficient documentation

## 2019-09-11 DIAGNOSIS — R7989 Other specified abnormal findings of blood chemistry: Secondary | ICD-10-CM

## 2019-09-11 DIAGNOSIS — R42 Dizziness and giddiness: Secondary | ICD-10-CM | POA: Insufficient documentation

## 2019-09-11 LAB — BASIC METABOLIC PANEL
Anion gap: 10 (ref 5–15)
BUN: 13 mg/dL (ref 8–23)
CO2: 23 mmol/L (ref 22–32)
Calcium: 9.5 mg/dL (ref 8.9–10.3)
Chloride: 110 mmol/L (ref 98–111)
Creatinine, Ser: 0.69 mg/dL (ref 0.44–1.00)
GFR calc Af Amer: >60 mL/min (ref >60)
GFR calc non Af Amer: >60 mL/min (ref >60)
Glucose, Bld: 102 mg/dL — ABNORMAL HIGH (ref 70–99)
Potassium: 4 mmol/L (ref 3.5–5.1)
Sodium: 143 mmol/L (ref 135–145)

## 2019-09-11 LAB — URINALYSIS, ROUTINE W REFLEX MICROSCOPIC
Bacteria, UA: NONE SEEN /HPF
Bilirubin Urine: NEGATIVE
Glucose, UA: NEGATIVE
Hgb urine dipstick: NEGATIVE
Hyaline Cast: NONE SEEN /LPF
Ketones, ur: NEGATIVE
Nitrite: NEGATIVE
Protein, ur: NEGATIVE
RBC / HPF: NONE SEEN /HPF (ref 0–2)
Specific Gravity, Urine: 1.005 (ref 1.001–1.03)
pH: 5.5 (ref 5.0–8.0)

## 2019-09-11 LAB — HEPATIC FUNCTION PANEL
AG Ratio: 1.6 (calc) (ref 1.0–2.5)
ALT: 13 U/L (ref 6–29)
AST: 15 U/L (ref 10–35)
Albumin: 4.4 g/dL (ref 3.6–5.1)
Alkaline phosphatase (APISO): 62 U/L (ref 37–153)
Bilirubin, Direct: 0.2 mg/dL (ref 0.0–0.2)
Globulin: 2.8 g/dL (calc) (ref 1.9–3.7)
Indirect Bilirubin: 0.8 mg/dL (calc) (ref 0.2–1.2)
Total Bilirubin: 1 mg/dL (ref 0.2–1.2)
Total Protein: 7.2 g/dL (ref 6.1–8.1)

## 2019-09-11 LAB — CBC WITH DIFFERENTIAL/PLATELET
Abs Immature Granulocytes: 0 10*3/uL (ref 0.00–0.07)
Basophils Absolute: 0 10*3/uL (ref 0.0–0.1)
Basophils Relative: 0 %
Eosinophils Absolute: 0.1 10*3/uL (ref 0.0–0.5)
Eosinophils Relative: 2 %
HCT: 39.3 % (ref 36.0–46.0)
Hemoglobin: 13.6 g/dL (ref 12.0–15.0)
Immature Granulocytes: 0 %
Lymphocytes Relative: 30 %
Lymphs Abs: 1.6 10*3/uL (ref 0.7–4.0)
MCH: 31.8 pg (ref 26.0–34.0)
MCHC: 34.6 g/dL (ref 30.0–36.0)
MCV: 91.8 fL (ref 80.0–100.0)
Monocytes Absolute: 0.5 10*3/uL (ref 0.1–1.0)
Monocytes Relative: 8 %
Neutro Abs: 3.3 10*3/uL (ref 1.7–7.7)
Neutrophils Relative %: 60 %
Platelets: 185 10*3/uL (ref 150–400)
RBC: 4.28 MIL/uL (ref 3.87–5.11)
RDW: 11.6 % (ref 11.5–15.5)
WBC: 5.5 10*3/uL (ref 4.0–10.5)
nRBC: 0 % (ref 0.0–0.2)

## 2019-09-11 LAB — D-DIMER, QUANTITATIVE: D-Dimer, Quant: 0.66 mcg/mL FEU — ABNORMAL HIGH (ref <0.50)

## 2019-09-11 LAB — VITAMIN D 25 HYDROXY (VIT D DEFICIENCY, FRACTURES): Vit D, 25-Hydroxy: 18 ng/mL — ABNORMAL LOW (ref 30–100)

## 2019-09-11 LAB — TSH: TSH: 1.87 mIU/L (ref 0.40–4.50)

## 2019-09-11 MED ORDER — IOHEXOL 350 MG/ML SOLN
75.0000 mL | Freq: Once | INTRAVENOUS | Status: AC | PRN
  Administered 2019-09-11: 75 mL via INTRAVENOUS

## 2019-09-11 NOTE — ED Triage Notes (Signed)
Pt arrives to ED via POV from home under direction of her PCP for an [place] and/or CT scan d/t abnormal lab values that resulted following an office visit (chart review shows elevated D-dimer). Pt denies any c/o pain or SHOB at this time. Pt is A&O, in NAD; RR even, regular, and unlabored.

## 2019-09-11 NOTE — ED Notes (Signed)
Pt coming in to the ED today due to PCP calling her about an elevated D-Dimer test. Pt saw her PCP yesterday and told her to come into the ED. Pt has no complaints at this time and denies pain. Pt A&O x4 and NAD. Sister at bedside. Pt connected to BP and pulse oximetry. Will continue to monitor.

## 2019-09-11 NOTE — ED Notes (Signed)
Patient transported to CT 

## 2019-09-11 NOTE — Telephone Encounter (Signed)
CT chest 09/11/2019  IMPRESSION: 1. No pulmonary emboli or acute abnormality. 2. Calcific coronary artery and aortic atherosclerosis=plaque build up in heart arteries   3. Moderate-sized hiatal hernia which can cause heartburn try pepcid otc as well as protonix elevated head of bed at 45 degrees   4. 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    No blood clots in the legs or lungs  This is great new no blood clots thanks for getting these tests done    TMS

## 2019-09-11 NOTE — ED Provider Notes (Signed)
[facility] Emergency Department Provider Note   ____________________________________________    I have reviewed the triage vital signs and the nursing notes.   HISTORY  Chief Complaint Abnormal Lab     HPI Leah Melendez is a 73 y.o. female who was sent in for an elevated D-dimer.  Patient reports that she has been worked up for syncopal episodes and dizziness.  She has seen multiple specialists, recently had D-dimer sent which was elevated at 0.66.  She denies chest pain.  No pleurisy.  No shortness of breath.  Currently is feeling well.  Afebrile no cough.  No history of blood clots, no leg pain or swelling Past Medical History:  Diagnosis Date  . Fatty liver 2010  . GERD (gastroesophageal reflux disease)   . H. pylori infection   . Hemangioma 2010  . Hiatal hernia   . Hyperlipidemia     Patient Active Problem List   Diagnosis Date Noted  . Dizziness 08/19/2019  . Abnormal EKG 08/19/2019  . Essential hypertension 08/19/2019  . Anxiety disorder 07/29/2019  . Exertional dyspnea 07/15/2019  . Syncope 07/15/2019  . Osteopenia 07/01/2019  . Vitamin D deficiency 08/17/2018  . Grief 08/12/2018  . 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 HIATAL HERNIA 11/30/2008  . Disorder of bone and cartilage 11/30/2008  . GERD 06/01/2008  . HYPERLIPIDEMIA, MIXED 03/19/2007    Past Surgical History:  Procedure Laterality Date  . CHOLECYSTECTOMY    . KNEE SURGERY  06/29/2007   Meniscus tear, left knee surgery    Prior to Admission medications   Medication Sig Start Date End Date Taking? Authorizing Provider  ALPRAZolam (XANAX) 0.25 MG tablet Take 0.5 mg by mouth 2 (two) times daily as needed.     [provider]  Ascorbic Acid (VITAMIN C) 500 MG tablet Take 500 mg by mouth daily.      [provider]  aspirin EC 81 MG tablet Take 81 mg by mouth daily.    [provider]  azelastine (ASTELIN) 0.1 % nasal  spray Place 2 sprays into both nostrils 2 (two) times daily. Use in each nostril as directed    [provider]  Calcium Carbonate-Vitamin D (CALCIUM 600/VITAMIN D PO) Take 1 tablet by mouth 2 (two) times daily.    [provider]  fish oil-omega-3 fatty acids 1000 MG capsule Take 2 g by mouth daily.     [provider]  Fluocinolone Acetonide 0.01 % OIL INT 2 TO 4 GTS IN AU QHS FOR 4 DAYS THEN PRN DRY SKIN AND IRRITATION 08/18/19   [provider]  loratadine (CLARITIN) 10 MG tablet Take 10 mg by mouth daily as needed for allergies.     [provider]  NONFORMULARY OR COMPOUNDED ITEM [place] Tendonitis Cream[facility] 2 refills    [provider]  pantoprazole (PROTONIX) 40 MG tablet TAKE 1 TABLET(40 MG) BY MOUTH DAILY Patient taking differently: Take 40 mg by mouth daily.  06/21/19   Lucille Passy, MD  simvastatin (ZOCOR) 40 MG tablet Take 1 tablet (40 mg total) by mouth daily. 08/12/18   Lucille Passy, MD  vitamin E 400 UNIT capsule Take 400 Units by mouth daily.     [provider]     Allergies Dexilant [dexlansoprazole] and Sulfonamide derivatives  Family History  Problem Relation Age of Onset  . Hypertension Sister   . Hypertension Brother   . Hypertension Sister   . Heart attack  Sister        73 y.o as of 09/10/19   . Colon cancer Neg Hx     Social History Social History   Tobacco Use  . Smoking status: Never Smoker  . Smokeless tobacco: Never Used  Substance Use Topics  . Alcohol use: No  . Drug use: No    Review of Systems  Constitutional: No fever/chills Eyes: No visual changes.  ENT: No sore throat. Cardiovascular: As above Respiratory: As above Gastrointestinal: No abdominal pain.  No nausea, no vomiting.   Genitourinary: Negative for dysuria. Musculoskeletal: Negative for back pain. Skin: Negative for rash. Neurological: Negative for headaches or weakness    ____________________________________________   PHYSICAL EXAM:  VITAL SIGNS: ED Triage Vitals  Enc Vitals Group     BP 09/11/19 0602 (!) 168/80     Pulse Rate 09/11/19 0602 95     Resp 09/11/19 0602 18     Temp 09/11/19 0602 98.3 F (36.8 C)     Temp Source 09/11/19 0602 Oral     SpO2 09/11/19 0602 98 %     Weight --      Height --      Head Circumference --      Peak Flow --      Pain Score 09/11/19 0601 0     Pain Loc --      Pain Edu? --      Excl. in [place]? --     Constitutional: Alert and oriented. No acute distress. Eyes: Conjunctivae are normal.   Mouth/Throat: Mucous membranes are moist.    Cardiovascular: Normal rate, regular rhythm. Grossly normal heart sounds.  Good peripheral circulation. Respiratory: Normal respiratory effort.  No retractions. Lungs CTAB. Gastrointestinal: Soft and nontender. No distention.  No CVA tenderness.  Musculoskeletal: No lower extremity tenderness nor edema.  Warm and well perfused Neurologic:  Normal speech and language. No gross focal neurologic deficits are appreciated.  Skin:  Skin is warm, dry and intact. No rash noted. Psychiatric: Mood and affect are normal. Speech and behavior are normal.  ____________________________________________   LABS (all labs ordered are listed, but only abnormal results are displayed)  Labs Reviewed  BASIC METABOLIC PANEL - Abnormal; Notable for the following components:      Result Value   Glucose, Bld 102 (*)    All other components within normal limits  CBC WITH DIFFERENTIAL/PLATELET   ____________________________________________  EKG  None ____________________________________________  RADIOLOGY  Ultrasound lower extremities no DVT  ____________________________________________   PROCEDURES  Procedure(s) performed: No  Procedures   Critical Care performed: No ____________________________________________   INITIAL IMPRESSION / ASSESSMENT AND PLAN / ED COURSE  Pertinent  labs & imaging results that were available during my care of the patient were reviewed by me and considered in my medical decision making (see chart for details).  Patient well-appearing and in no acute distress, asymptomatic but with elevated D-dimer and reports of dizziness, will send for CTA.  Ultrasound lower extremities negative  CTA negative for PE, patient reassured, discussed additional findings on CT, outpatient follow-up as needed    ____________________________________________   FINAL CLINICAL IMPRESSION(S) / ED DIAGNOSES  Final diagnoses:  Elevated d-dimer  Dizziness        Note:  This document was prepared using Dragon voice recognition software and may include unintentional dictation errors.   Lavonia Drafts, MD 09/11/19 1440

## 2019-09-13 ENCOUNTER — Other Ambulatory Visit (INDEPENDENT_AMBULATORY_CARE_PROVIDER_SITE_OTHER): Payer: Medicare Other

## 2019-09-13 ENCOUNTER — Telehealth: Payer: Self-pay | Admitting: *Deleted

## 2019-09-13 ENCOUNTER — Other Ambulatory Visit: Payer: Self-pay | Admitting: Internal Medicine

## 2019-09-13 ENCOUNTER — Other Ambulatory Visit: Payer: Self-pay

## 2019-09-13 ENCOUNTER — Encounter: Payer: Self-pay | Admitting: *Deleted

## 2019-09-13 DIAGNOSIS — E785 Hyperlipidemia, unspecified: Secondary | ICD-10-CM

## 2019-09-13 DIAGNOSIS — Z13220 Encounter for screening for lipoid disorders: Secondary | ICD-10-CM

## 2019-09-13 LAB — LIPID PANEL
Cholesterol: 156 mg/dL (ref 0–200)
HDL: 47 mg/dL (ref >39.00)
LDL Cholesterol: 85 mg/dL (ref 0–99)
NonHDL: 109.48
Total CHOL/HDL Ratio: 3
Triglycerides: 124 mg/dL (ref 0.0–149.0)
VLDL: 24.8 mg/dL (ref 0.0–40.0)

## 2019-09-13 NOTE — Telephone Encounter (Signed)
Lipid is all I needed and didn't order Friday due to didn't want this drawn  Sorry about this ordered lipid only    [place]

## 2019-09-13 NOTE — Telephone Encounter (Signed)
Pt came in for more labs today & she was just here Friday. She states she was supposed to come in in for a cholesterol test today. No lipid was ordered. I went ahead a drew a few different tubes (including one for a D-Dimer if needed).  Please place future orders & let me know when they are in.  Thanks

## 2019-09-14 ENCOUNTER — Encounter: Payer: Self-pay | Admitting: Internal Medicine

## 2019-09-15 ENCOUNTER — Telehealth: Payer: Self-pay | Admitting: Internal Medicine

## 2019-09-15 NOTE — Telephone Encounter (Signed)
Left message for patient to return call back. PEC may give results and obtain information.  

## 2019-09-15 NOTE — Telephone Encounter (Signed)
Pt called requesting call back from [place] as early as possible. Pt is anxiously anticipating fu call/information. Please advise

## 2019-09-15 NOTE — Telephone Encounter (Signed)
Pt called Leah Melendez back. The PEC says there is a CRM in chart but triage said she can not release results. Please call Patient.

## 2019-09-16 NOTE — Telephone Encounter (Signed)
Patient was informed.  Patient understood and no questions, comments, or concerns at this time.  

## 2019-09-20 ENCOUNTER — Other Ambulatory Visit: Payer: Self-pay

## 2019-09-20 MED ORDER — SIMVASTATIN 40 MG PO TABS: 40.0000 mg | ORAL_TABLET | Freq: Every day | ORAL | 3 refills | Status: DC

## 2019-09-20 NOTE — Telephone Encounter (Signed)
Last OV 07/15/19 Last fill 08/12/18  #90/3

## 2019-10-01 ENCOUNTER — Encounter: Payer: Self-pay | Admitting: Cardiovascular Disease

## 2019-10-01 ENCOUNTER — Ambulatory Visit (INDEPENDENT_AMBULATORY_CARE_PROVIDER_SITE_OTHER): Payer: Medicare Other | Admitting: Cardiovascular Disease

## 2019-10-01 ENCOUNTER — Other Ambulatory Visit: Payer: Self-pay

## 2019-10-01 VITALS — BP 138/90 | HR 87 | Ht 61.0 in | Wt 178.5 lb

## 2019-10-01 DIAGNOSIS — I10 Essential (primary) hypertension: Secondary | ICD-10-CM | POA: Diagnosis not present

## 2019-10-01 DIAGNOSIS — R42 Dizziness and giddiness: Secondary | ICD-10-CM

## 2019-10-01 DIAGNOSIS — I700 Atherosclerosis of aorta: Secondary | ICD-10-CM | POA: Diagnosis not present

## 2019-10-01 DIAGNOSIS — R55 Syncope and collapse: Secondary | ICD-10-CM

## 2019-10-01 DIAGNOSIS — E782 Mixed hyperlipidemia: Secondary | ICD-10-CM

## 2019-10-01 DIAGNOSIS — R0789 Other chest pain: Secondary | ICD-10-CM

## 2019-10-01 MED ORDER — EZETIMIBE 10 MG PO TABS: 10.0000 mg | ORAL_TABLET | Freq: Every day | ORAL | 3 refills | Status: DC

## 2019-10-01 NOTE — Patient Instructions (Addendum)
Medication Instructions:  Please start zetia 10 mg for cholesterol Stay on simvastatin 40 mg daily  If you need a refill on your cardiac medications before your next appointment, please call your pharmacy.    Lab work: No new labs needed   If you have labs (blood work) drawn today and your tests are completely normal, you will receive your results only by: Marland Kitchen MyChart Message (if you have MyChart) OR . A paper copy in the mail If you have any lab test that is abnormal or we need to change your treatment, we will call you to review the results.   Testing/Procedures: No new testing needed   Follow-Up: At [facility], you and your health needs are our priority.  As part of our continuing mission to provide you with exceptional heart care, we have created designated Provider Care Teams.  These Care Teams include your primary Cardiologist (physician) and Advanced Practice Providers (APPs -  Physician Assistants and Nurse Practitioners) who all work together to provide you with the care you need, when you need it.  . You will need a follow up appointment in 6 months with Dr. Saunders Revel  . Providers on your designated Care Team:   . Murray Hodgkins, NP . Christell Faith, PA-C . Marrianne Mood, PA-C  Any Other Special Instructions Will Be Listed Below (If Applicable).  For educational health videos Log in to : [email or web address] Or : [email or web address], password : [email or web address]

## 2019-10-01 NOTE — Progress Notes (Signed)
Cardiology Office Note  Date:  10/01/2019   ID:  Leah Melendez, DOB 1945/12/13, MRN [id number]  PCP:  McLean-Scocuzza, Leah Melendez   Chief Complaint  Patient presents with  . other    1 month f/u c/o rapid heart rate and edema ankles with itching. Meds reviewed verbally with pt.    HPI:  Leah Melendez is a  73 year old woman with history of  GERD, hiatal hernia,  Mild aortic atherosclerosis Chronic dizziness History of syncope Who presents to the office for follow-up of her syncope, tachycardia  ER 09/11/2019, events discussed with her elevated D-dimer as an outpatient CTA negative for PE, Images pulled up and reviewed, mild aortic athero , mild coronary calcification  Discussed previous events from September 2020  transient loss of consciousness when sitting up after a DEXA scan with her gynecologist on 07/01/2019.  went to urgent care and was found to be tachycardic Recovered  but in follow-up reported reported chest tightness radiating to the left arm  --- Stress test was ordered that showed no ischemia, results discussed with her  On last clinic visit reported having continued episodes of dizziness, Was seen by ENT, completed course of prednisone  Continues to have chronic lower extremity edema  In follow-up today she reports no further episodes of tachycardia She continues to have adjustment issues after loss of her husband over 1 year ago Has lots of family nearby She is sedentary, no regular exercise program, not much walking She does not want more medications at this time Feels her blood pressure well controlled at home   EKG personally reviewed by myself on todays visit Shows normal sinus rhythm rate 87 bpm No change noted from prior EKG August 21, 2019    PMH:   has a past medical history of Anxiety, Fatty liver (2010), GERD (gastroesophageal reflux disease), H. pylori infection, Hemangioma (2010), Hiatal hernia, Hyperlipidemia, and Liver  hemangioma.  PSH:    Past Surgical History:  Procedure Laterality Date  . bilateral lens replacement    . CHOLECYSTECTOMY    . KNEE SURGERY  06/29/2007   Meniscus tear, left knee surgery    Current Outpatient Medications  Medication Sig Dispense Refill  . ALPRAZolam (XANAX) 0.25 MG tablet Take 0.5 mg by mouth 2 (two) times daily as needed.     . Ascorbic Acid (VITAMIN C) 500 MG tablet Take 500 mg by mouth daily.      Marland Kitchen aspirin EC 81 MG tablet Take 81 mg by mouth daily.    Marland Kitchen azelastine (ASTELIN) 0.1 % nasal spray Place 2 sprays into both nostrils as needed. Use in each nostril as directed     . Calcium Carbonate-Vitamin D (CALCIUM 600/VITAMIN D PO) Take 1 tablet by mouth 2 (two) times daily.    . fish oil-omega-3 fatty acids 1000 MG capsule Take 2 g by mouth daily.     . Fluocinolone Acetonide 0.01 % OIL INT 2 TO 4 GTS IN AU QHS FOR 4 DAYS THEN PRN DRY SKIN AND IRRITATION    . loratadine (CLARITIN) 10 MG tablet Take 10 mg by mouth daily as needed for allergies.     . NONFORMULARY OR COMPOUNDED ITEM Achilles Tendonitis Cream[facility] 2 refills    . pantoprazole (PROTONIX) 40 MG tablet TAKE 1 TABLET(40 MG) BY MOUTH DAILY (Patient taking differently: Take 40 mg by mouth daily. ) 90 tablet 3  . simvastatin (ZOCOR) 40 MG tablet Take 1 tablet (40 mg total) by mouth daily. 90 tablet  3  . vitamin E 400 UNIT capsule Take 400 Units by mouth daily.     Marland Kitchen ezetimibe (ZETIA) 10 MG tablet Take 1 tablet (10 mg total) by mouth daily. 90 tablet 3   No current facility-administered medications for this visit.      Allergies:   Dexilant [dexlansoprazole] and Sulfonamide derivatives   Social History:  The patient  reports that she has never smoked. She has never used smokeless tobacco. She reports that she does not drink alcohol or use drugs.   Family History:   family history includes Heart attack in her sister; Hypertension in her brother, sister, and sister.    Review of Systems: Review  of Systems  Constitutional: Negative.   HENT: Negative.   Respiratory: Negative.   Cardiovascular: Negative.   Gastrointestinal: Negative.   Musculoskeletal: Negative.   Neurological: Negative.   Psychiatric/Behavioral: Negative.   All other systems reviewed and are negative.    PHYSICAL EXAM: VS:  BP 138/90 (BP Location: Left Arm, Patient Position: Sitting, Cuff Size: Normal)   Pulse 87   Ht 5\' 1"  (1.549 m)   Wt 178 lb 8 oz (81 kg)   SpO2 98%   BMI 33.73 kg/m  , BMI Body mass index is 33.73 kg/m. GEN: Well nourished, well developed, in no acute distress HEENT: normal Neck: no JVD, carotid bruits, or masses Cardiac: RRR; no murmurs, rubs, or gallops,no edema  Respiratory:  clear to auscultation bilaterally, normal work of breathing GI: soft, nontender, nondistended, + BS MS: no deformity or atrophy Skin: warm and dry, no rash Neuro:  Strength and sensation are intact Psych: euthymic mood, full affect   Recent Labs: 09/10/2019: ALT 13; TSH 1.87 09/11/2019: BUN 13; Creatinine, Ser 0.69; Hemoglobin 13.6; Platelets 185; Potassium 4.0; Sodium 143    Lipid Panel Lab Results  Component Value Date   CHOL 156 09/13/2019   HDL 47.00 09/13/2019   LDLCALC 85 09/13/2019   TRIG 124.0 09/13/2019      Wt Readings from Last 3 Encounters:  10/01/19 178 lb 8 oz (81 kg)  09/10/19 178 lb 6.4 oz (80.9 kg)  08/18/19 180 lb 8 oz (81.9 kg)      ASSESSMENT AND PLAN:  Problem List Items Addressed This Visit      Cardiology Problems   Essential hypertension   Relevant Medications   ezetimibe (ZETIA) 10 MG tablet   Syncope   Relevant Medications   ezetimibe (ZETIA) 10 MG tablet   Other Relevant Orders   EKG 12-Lead   HYPERLIPIDEMIA, MIXED   Relevant Medications   ezetimibe (ZETIA) 10 MG tablet     Other   Dizziness    Other Visit Diagnoses    Aortic atherosclerosis ([place])    -  Primary   Relevant Medications   ezetimibe (ZETIA) 10 MG tablet   Chest pressure          Long discussion concerning recent events Denies chest pain, no further tachycardia Prefers not to be on a beta-blocker -Stress test discussed with her, mild coronary calcification and mild aortic atherosclerosis -Cholesterol above goal, recommended she add Zetia to her simvastatin She will call us for any further episodes of tachycardia -If needed could consider prescription of propranolol 10 to 20 mg as needed  Dizziness Denies any further episodes, Previously seen by ENT, course of prednisone, fullness in the right ear down the neck has resolved Blood pressure stable, no evidence of orthostasis  Hyperlipidemia Recommend she continue simvastatin, add Zetia to reach  goal LDL less than 70 Lifestyle modification recommended, dietary changes, walking program  Disposition:   F/U  6 months with Dr. Saunders Revel   Total encounter time more than 25 minutes  Greater than 50% was spent in counseling and coordination of care with the patient    Signed, Esmond Plants, M.D., Ph.D. [place], [place]

## 2019-10-26 ENCOUNTER — Ambulatory Visit (INDEPENDENT_AMBULATORY_CARE_PROVIDER_SITE_OTHER): Payer: Medicare Other | Admitting: Internal Medicine

## 2019-10-26 ENCOUNTER — Other Ambulatory Visit: Payer: Self-pay

## 2019-10-26 VITALS — BP 131/79 | HR 68 | Ht 61.0 in | Wt 178.5 lb

## 2019-10-26 DIAGNOSIS — R739 Hyperglycemia, unspecified: Secondary | ICD-10-CM

## 2019-10-26 DIAGNOSIS — I2510 Atherosclerotic heart disease of native coronary artery without angina pectoris: Secondary | ICD-10-CM | POA: Diagnosis not present

## 2019-10-26 DIAGNOSIS — R55 Syncope and collapse: Secondary | ICD-10-CM | POA: Diagnosis not present

## 2019-10-26 DIAGNOSIS — R000 Tachycardia, unspecified: Secondary | ICD-10-CM

## 2019-10-26 DIAGNOSIS — E782 Mixed hyperlipidemia: Secondary | ICD-10-CM | POA: Diagnosis not present

## 2019-10-26 DIAGNOSIS — E559 Vitamin D deficiency, unspecified: Secondary | ICD-10-CM

## 2019-10-26 NOTE — Progress Notes (Signed)
Telephone Note  I connected with Leah Melendez  on 10/26/19 at 11:30 AM EST by a telephone and verified that I am speaking with the correct person using two identifiers.  Location patient: home Location provider:work or home office Persons participating in the virtual visit: patient, provider  I discussed the limitations of evaluation and management by telemedicine and the availability of in person appointments. The patient expressed understanding and agreed to proceed.   HPI: 1. Sinus tachycardia improved cards no need for BB propranolol 10-20 mg qd prn for now pt denies anxiety  2. Syncope no further episodes and cardiac w/u and PE w/u negative appt [facility] 10/2019 pending. MRI brain 08/31/2019 negative and ENT Dr. Pryor Ochoa no etiology  3.CAD added zetia 10 mg and will repeat labs upcoming stress test 07/2019 negative  4. Chronic sinus issues pt has astelin, claritin, and flonase at home will try all and call back if not improved   ROS: See pertinent positives and negatives per HPI.  Past Medical History:  Diagnosis Date  . Anxiety   . Fatty liver 2010  . GERD (gastroesophageal reflux disease)   . H. pylori infection   . Hemangioma 2010  . Hiatal hernia   . Hyperlipidemia   . Liver hemangioma     Past Surgical History:  Procedure Laterality Date  . bilateral lens replacement    . CHOLECYSTECTOMY    . KNEE SURGERY  06/29/2007   Meniscus tear, left knee surgery    Family History  Problem Relation Age of Onset  . Hypertension Sister   . Hypertension Brother   . Hypertension Sister   . Heart attack Sister        73 y.o as of 09/10/19   . Colon cancer Neg Hx     SOCIAL HX:  Lives alone   Current Outpatient Medications:  .  ALPRAZolam (XANAX) 0.25 MG tablet, Take 0.5 mg by mouth 2 (two) times daily as needed. , Disp: , Rfl:  .  Ascorbic Acid (VITAMIN C) 500 MG tablet, Take 500 mg by mouth daily.  , Disp: , Rfl:  .  aspirin EC 81 MG tablet, Take 81 mg by  mouth daily., Disp: , Rfl:  .  azelastine (ASTELIN) 0.1 % nasal spray, Place 2 sprays into both nostrils as needed. Use in each nostril as directed , Disp: , Rfl:  .  Calcium Carbonate-Vitamin D (CALCIUM 600/VITAMIN D PO), Take 1 tablet by mouth 2 (two) times daily., Disp: , Rfl:  .  ezetimibe (ZETIA) 10 MG tablet, Take 1 tablet (10 mg total) by mouth daily., Disp: 90 tablet, Rfl: 3 .  fish oil-omega-3 fatty acids 1000 MG capsule, Take 2 g by mouth daily. , Disp: , Rfl:  .  Fluocinolone Acetonide 0.01 % OIL, INT 2 TO 4 GTS IN AU QHS FOR 4 DAYS THEN PRN DRY SKIN AND IRRITATION, Disp: , Rfl:  .  fluticasone (FLONASE) 50 MCG/ACT nasal spray, Place into both nostrils daily., Disp: , Rfl:  .  loratadine (CLARITIN) 10 MG tablet, Take 10 mg by mouth daily as needed for allergies. , Disp: , Rfl:  .  NONFORMULARY OR COMPOUNDED ITEM, Achilles Tendonitis Cream[facility] 2 refills, Disp: , Rfl:  .  pantoprazole (PROTONIX) 40 MG tablet, TAKE 1 TABLET(40 MG) BY MOUTH DAILY (Patient taking differently: Take 40 mg by mouth daily. ), Disp: 90 tablet, Rfl: 3 .  simvastatin (ZOCOR) 40 MG tablet, Take 1 tablet (40 mg total) by mouth daily., Disp: 90  tablet, Rfl: 3 .  vitamin E 400 UNIT capsule, Take 400 Units by mouth daily. , Disp: , Rfl:   EXAM:  VITALS per patient if applicable:  GENERAL: alert, oriented, appears well and in no acute distress  HEENT: atraumatic, conjunttiva clear, no obvious abnormalities on inspection of external nose and ears  NECK: normal movements of the head and neck  LUNGS: on inspection no signs of respiratory distress, breathing rate appears normal, no obvious gross SOB, gasping or wheezing  CV: no obvious cyanosis  MS: moves all visible extremities without noticeable abnormality  PSYCH/NEURO: pleasant and cooperative, no obvious depression or anxiety, speech and thought processing grossly intact  ASSESSMENT AND PLAN:  Discussed the following assessment and  plan:  Syncope, 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 Deatra Ina f/u in 20 years  Out of age window pap  Of note sister patsy lough also a pt   Dr. Pryor Ochoa ENT  [facility] ob/g[facility] 07/01/2019  -we discussed possible serious and likely etiologies, options for evaluation and workup, limitations of telemedicine visit vs in person visit, treatment, treatment risks and precautions. Pt prefers to treat via telemedicine empirically rather then risking or undertaking an in person visit at this moment. Patient agrees to seek prompt in person care if worsening, new symptoms arise, or if is not improving with treatment.   I discussed the assessment and treatment plan with the patient. The patient was provided an opportunity to ask questions and all were answered. The patient agreed with the plan and demonstrated an understanding of the instructions.   The patient was advised to call back or seek an in-person evaluation if the symptoms worsen or if the condition fails to improve as anticipated.  Time spent 20 minutes  Delorise Jackson, MD

## 2019-11-30 ENCOUNTER — Other Ambulatory Visit: Payer: Self-pay

## 2019-11-30 ENCOUNTER — Ambulatory Visit (INDEPENDENT_AMBULATORY_CARE_PROVIDER_SITE_OTHER): Payer: Medicare PPO

## 2019-11-30 VITALS — Ht 61.0 in | Wt 178.0 lb

## 2019-11-30 DIAGNOSIS — Z0000 Encounter for general adult medical examination without abnormal findings: Secondary | ICD-10-CM | POA: Diagnosis not present

## 2019-11-30 NOTE — Patient Instructions (Addendum)
  Leah Melendez , Thank you for taking time to come for your Medicare Wellness Visit. I appreciate your ongoing commitment to your health goals. Please review the following plan we discussed and let me know if I can assist you in the future.   These are the goals we discussed: Goals    . Follow up with Primary Care Provider     As needed.       This is a list of the screening recommended for you and due dates:  Health Maintenance  Topic Date Due  . Tetanus Vaccine  11/30/2018  . Colon Cancer Screening  07/24/2021  . Flu Shot  Completed  . DEXA scan (bone density measurement)  Completed  .  Hepatitis C: One time screening is recommended by Center for Disease Control  (CDC) for  adults born from [birth year] through 1967.   Completed  . Pneumonia vaccines  Completed

## 2019-11-30 NOTE — Progress Notes (Signed)
Subjective:   Leah Melendez is a 74 y.o. female who presents for Medicare Annual (Subsequent) preventive examination.  Review of Systems:  No ROS.  Medicare Wellness Virtual Visit.  Visual/audio telehealth visit, UTA vital signs.   Ht/Wt provided.  See social history for additional risk factors.   Cardiac Risk Factors include: advanced age (>70men, >2 women);hypertension     Objective:     Vitals: Ht 5\' 1"  (1.549 m)   Wt 178 lb (80.7 kg)   BMI 33.63 kg/m   Body mass index is 33.63 kg/m.  Advanced Directives 11/30/2019 09/11/2019 07/01/2019 08/12/2018 07/29/2017 07/22/2016  Does Patient Have a Medical Advance Directive? Yes No No Yes Yes Yes  Type of Paramedic of [place];Living will - - [place];Living will [place];Living will [place];Living will  Does patient want to make changes to medical advance directive? No - Patient declined - - - - No - Patient declined  Copy of [place] in Chart? No - copy requested - - No - copy requested No - copy requested No - copy requested  Would patient like information on creating a medical advance directive? - - No - Guardian declined - - -    Tobacco Social History   Tobacco Use  Smoking Status Never Smoker  Smokeless Tobacco Never Used     Counseling given: Not Answered   Clinical Intake:  Pre-visit preparation completed: Yes        Diabetes: No  How often do you need to have someone help you when you read instructions, pamphlets, or other written materials from your doctor or pharmacy?: 1 - Never  Interpreter Needed?: No     Past Medical History:  Diagnosis Date  . Anxiety   . Fatty liver 2010  . GERD (gastroesophageal reflux disease)   . H. pylori infection   . Hemangioma 2010  . Hiatal hernia   . Hyperlipidemia   . Liver hemangioma    Past Surgical History:  Procedure Laterality Date  . bilateral  lens replacement    . CHOLECYSTECTOMY    . KNEE SURGERY  06/29/2007   Meniscus tear, left knee surgery   Family History  Problem Relation Age of Onset  . Hypertension Sister   . Hypertension Brother   . Hypertension Sister   . Heart attack Sister        67 y.o as of 09/10/19   . Colon cancer Neg Hx    Social History   Socioeconomic History  . Marital status: Widowed    Spouse name: Not on file  . Number of children: 2  . Years of education: Not on file  . Highest education level: Not on file  Occupational History  . Occupation: Retired Garment/textile technologist  Tobacco Use  . Smoking status: Never Smoker  . Smokeless tobacco: Never Used  Substance and Sexual Activity  . Alcohol use: No  . Drug use: No  . Sexual activity: Not Currently  Other Topics Concern  . Not on file  Social History Narrative   Has living will.  Widow as of 04/08/18.  Daughters have a copy of living will and [name] would be HPOA if husband was not able.   1 daughter lives in [place] has 4 kids and 1 daughter in [place] with 2 kids      Would desire CPR.  She would not want heroic measures.  Social Determinants of Health   Financial Resource Strain: Low Risk   . Difficulty of Paying Living Expenses: Not hard at all  Food Insecurity: No Food Insecurity  . Worried About Charity fundraiser in the Last Year: Never true  . Ran Out of Food in the Last Year: Never true  Transportation Needs: No Transportation Needs  . Lack of Transportation (Medical): No  . Lack of Transportation (Non-Medical): No  Physical Activity:   . Days of Exercise per Week: Not on file  . Minutes of Exercise per Session: Not on file  Stress: No Stress Concern Present  . Feeling of Stress : Not at all  Social Connections: Unknown  . Frequency of Communication with Friends and Family: More than three times a week  . Frequency of Social Gatherings with Friends and Family: More than three times a  week  . Attends Religious Services: Not on file  . Active Member of Clubs or Organizations: Not on file  . Attends Archivist Meetings: Not on file  . Marital Status: Widowed    Outpatient Encounter Medications as of 11/30/2019  Medication Sig  . ALPRAZolam (XANAX) 0.25 MG tablet Take 0.5 mg by mouth 2 (two) times daily as needed.   . Ascorbic Acid (VITAMIN C) 500 MG tablet Take 500 mg by mouth daily.    [place] Kitchen aspirin EC 81 MG tablet Take 81 mg by mouth daily.  Mar[place] Kitchen azelastine (ASTELIN) 0.1 % nasal spray Place 2 sprays into both nostrils as needed. Use in each nostril as directed   . Calcium Carbonate-Vitamin D (CALCIUM 600/VITAMIN D PO) Take 1 tablet by mouth 2 (two) times daily.  Mar[place] Kitchen ezetimibe (ZETIA) 10 MG tablet Take 1 tablet (10 mg total) by mouth daily.  . fish oil-omega-3 fatty acids 1000 MG capsule Take 2 g by mouth daily.   . Fluocinolone Acetonide 0.01 % OIL INT 2 TO 4 GTS IN AU QHS FOR 4 DAYS THEN PRN DRY SKIN AND IRRITATION  . fluticasone (FLONASE) 50 MCG/ACT nasal spray Place into both nostrils daily.  Mar[place] Kitchen loratadine (CLARITIN) 10 MG tablet Take 10 mg by mouth daily as needed for allergies.   . NONFORMULARY OR COMPOUNDED ITEM Achilles Tendonitis Cream-[facility] 2 refills  . pantoprazole (PROTONIX) 40 MG tablet TAKE 1 TABLET(40 MG) BY MOUTH DAILY (Patient taking differently: Take 40 mg by mouth daily. )  . simvastatin (ZOCOR) 40 MG tablet Take 1 tablet (40 mg total) by mouth daily.  . vitamin E 400 UNIT capsule Take 400 Units by mouth daily.    No facility-administered encounter medications on file as of 11/30/2019.    Activities of Daily Living In your present state of health, do you have any difficulty performing the following activities: 11/30/2019  Hearing? N  Vision? N  Difficulty concentrating or making decisions? N  Walking or climbing stairs? N  Dressing or bathing? N  Doing errands, shopping? N  Preparing Food and eating ? N  Using the Toilet? N  In the  past six months, have you accidently leaked urine? N  Do you have problems with loss of bowel control? N  Managing your Medications? N  Managing your Finances? N  Housekeeping or managing your Housekeeping? N  Some recent data might be hidden    Patient Care Team: McLean-Scocuzza, Nino Glow, MD as PCP - General (Internal Medicine) Johnney Ou., MD as Consulting Physician (Internal Medicine) Inda Castle, MD (Inactive) as Consulting Physician (Gastroenterology)    Assessment:  This is a routine wellness examination for [place].  Nurse connected with patient 11/30/19 at 12:30 PM EST by a telephone enabled telemedicine application and verified that I am speaking with the correct person using two identifiers. Patient stated full name and DOB. Patient gave permission to continue with virtual visit. Patient's location was at home and Nurse's location was at [place] office.   Patient is alert and oriented x3. Patient denies difficulty focusing or concentrating. Patient likes to read for brain stimulation.   Health Maintenance Due: -Tdap vaccine- discussed; to be completed with doctor in visit or local pharmacy.   See completed HM at the end of note.   Eye: Visual acuity not assessed. Virtual visit. Followed by their ophthalmologist.  Dental: UTD  Hearing: Demonstrates normal hearing during visit.  Safety:  Patient feels safe at home- yes Patient does have smoke detectors at home- yes Patient does wear sunscreen or protective clothing when in direct sunlight - yes Patient does wear seat belt when in a moving vehicle - yes Patient drives- yes Adequate lighting in walkways free from debris- yes Grab bars and handrails used as appropriate- yes Ambulates with an assistive device- no Cell phone on person when ambulating outside of the home- yes  Social: Alcohol intake - no  Smoking history- never  Smokers in home? none Illicit drug use? none  Medication: Taking as  directed and without issues.  Self managed - yes   Covid-19: Precautions and sickness symptoms discussed. Wears mask, social distancing, hand hygiene as appropriate.   Activities of Daily Living Patient denies needing assistance with: household chores, feeding themselves, getting from bed to chair, getting to the toilet, bathing/showering, dressing, managing money, or preparing meals.   Discussed the importance of a healthy diet, water intake and the benefits of aerobic exercise.   Physical activity- Walking. Active with yard work. Strengthening exercises.   Diet:  Regular Water: good intake  Other Providers Patient Care Team: McLean-Scocuzza, Nino Glow, MD as PCP - General (Internal Medicine) Johnney Ou., MD as Consulting Physician (Internal Medicine) Inda Castle, MD (Inactive) as Consulting Physician (Gastroenterology) Exercise Activities and Dietary recommendations Current Exercise Habits: Home exercise routine, Type of exercise: walking, Intensity: Mild  Goals    . Follow up with Primary Care Provider     As needed.       Fall Risk Fall Risk  11/30/2019 10/26/2019 08/12/2018 07/31/2017 07/29/2017  Falls in the past year? 0 0 No No Yes  Number falls in past yr: - - - - 1  Injury with Fall? - - - - Yes  Follow up Falls evaluation completed - - - -   Timed Get Up and Go performed: no, virtual visit  Depression Screen PHQ 2/9 Scores 11/30/2019 10/26/2019 08/12/2018 07/31/2017  PHQ - 2 Score 0 0 0 0  PHQ- 9 Score - - - -     Cognitive Function MMSE - Mini Mental State Exam 07/29/2017 07/22/2016  Orientation to time 5 5  Orientation to Place 5 5  Registration 3 3  Attention/ Calculation 0 0  Recall 3 3  Language- name 2 objects 0 0  Language- repeat 1 1  Language- follow 3 step command 3 3  Language- read & follow direction 0 0  Write a sentence 0 0  Copy design 0 0  Total score 20 20     6CIT Screen 11/30/2019  What Year? 0 points  What month? 0 points    What time? 0 points  Immunization History  Administered Date(s) Administered  . Fluad Quad(high Dose 65+) 08/16/2019  . Influenza Split 07/10/2011  . Influenza Whole 07/27/2010, 07/18/2012  . Influenza,inj,Quad PF,6+ Mos 07/14/2014  . Influenza-Unspecified 06/28/2013, 06/19/2015  . Pneumococcal Conjugate-13 04/05/2014  . Pneumococcal Polysaccharide-23 01/13/2013  . Td 11/30/2008  . Zoster 11/30/2008  . Zoster Recombinat (Shingrix) 01/29/2018, 05/06/2018   Screening Tests Health Maintenance  Topic Date Due  . TETANUS/TDAP  11/30/2018  . COLONOSCOPY  07/24/2021  . INFLUENZA VACCINE  Completed  . DEXA SCAN  Completed  . Hepatitis C Screening  Completed  . PNA vac Low Risk Adult  Completed      Plan:   Keep all routine maintenance appointments.   Next scheduled fasting lab 03/13/20 @ 9:00.  Follow up 03/16/20 @ 11:00.  Medicare Attestation I have personally reviewed: The patient's medical and social history Their use of alcohol, tobacco or illicit drugs Their current medications and supplements The patient's functional ability including ADLs,fall risks, home safety risks, cognitive, and hearing and visual impairment Diet and physical activities Evidence for depression    I have reviewed and discussed with patient certain preventive protocols, quality metrics, and best practice recommendations.      Varney Biles, LPN  [id number]

## 2020-01-14 ENCOUNTER — Other Ambulatory Visit: Payer: Self-pay

## 2020-01-14 ENCOUNTER — Encounter: Payer: Self-pay | Admitting: Dermatology

## 2020-01-14 ENCOUNTER — Ambulatory Visit: Payer: Medicare PPO | Admitting: Dermatology

## 2020-01-14 DIAGNOSIS — Z85828 Personal history of other malignant neoplasm of skin: Secondary | ICD-10-CM

## 2020-01-14 DIAGNOSIS — Z1283 Encounter for screening for malignant neoplasm of skin: Secondary | ICD-10-CM

## 2020-01-14 DIAGNOSIS — L814 Other melanin hyperpigmentation: Secondary | ICD-10-CM | POA: Diagnosis not present

## 2020-01-14 DIAGNOSIS — L821 Other seborrheic keratosis: Secondary | ICD-10-CM | POA: Diagnosis not present

## 2020-01-14 DIAGNOSIS — D225 Melanocytic nevi of trunk: Secondary | ICD-10-CM

## 2020-01-14 DIAGNOSIS — L918 Other hypertrophic disorders of the skin: Secondary | ICD-10-CM

## 2020-01-14 DIAGNOSIS — L708 Other acne: Secondary | ICD-10-CM

## 2020-01-14 DIAGNOSIS — D1800 Hemangioma unspecified site: Secondary | ICD-10-CM

## 2020-01-14 DIAGNOSIS — L578 Other skin changes due to chronic exposure to nonionizing radiation: Secondary | ICD-10-CM

## 2020-01-14 DIAGNOSIS — D2271 Melanocytic nevi of right lower limb, including hip: Secondary | ICD-10-CM

## 2020-01-14 DIAGNOSIS — D239 Other benign neoplasm of skin, unspecified: Secondary | ICD-10-CM

## 2020-01-14 DIAGNOSIS — D229 Melanocytic nevi, unspecified: Secondary | ICD-10-CM

## 2020-01-14 NOTE — Progress Notes (Signed)
   Follow-Up Visit   Subjective  AI BREAULT is a 74 y.o. female who presents for the following: Annual Exam (history of BCC on the R upper forehead which was treated by [facility] last year. ).  The following portions of the chart were reviewed this encounter and updated as appropriate:     Review of Systems: No other skin or systemic complaints.  Objective  Well appearing patient in no apparent distress; mood and affect are within normal limits.  A full examination was performed including scalp, head, eyes, ears, nose, lips, neck, chest, axillae, abdomen, back, buttocks, bilateral upper extremities, bilateral lower extremities, hands, feet, fingers, toes, fingernails, and toenails. All findings within normal limits unless otherwise noted below.  Objective  face, trunk, extremities: Actinic changes   Objective  R lower sternum, R spinal upper back (2): Dilated pore  Objective  trunk, extremities: Red papules.   Objective  R upper forehead: Well healed scar with no evidence of recurrence.   Objective  face, trunk, extremities: Scattered tan macules.   R malar cheek: Tan macule, slightly waxy - (vs SK)  Objective  trunk, extremities: Tan-brown and/or pink-flesh-colored symmetric macules and papules.   R ant thigh: 0.3 cm medium brown macule  R upper back: 0.4 cm speckled brown papule (vs SK)  Objective  trunk, extremities: Stuck-on, waxy, tan-brown papule or plaque --Discussed benign etiology and prognosis.   Objective  L inframammary: Fleshy, skin-colored pedunculated papules.    Assessment & Plan  Actinic skin damage face, trunk, extremities  Recommend daily broad spectrum sunscreen SPF 30+ to sun-exposed areas, reapply every 2 hours as needed   Dilated pore of [place] (2) R lower sternum, R spinal upper back  Benign, observe. Discussed extraction if symptomatic  Hemangioma, unspecified site trunk, extremities  Benign, observe.  History of  basal cell carcinoma (BCC) R upper forehead  Clear. Observe for recurrence.  Recommend regular skin exams, sunscreen use, and photoprotection.   Lentigines (2) R malar cheek; face, trunk, extremities  Benign.  Observe.   Nevus (3) R ant thigh; R upper back; trunk, extremities  Benign- appearing, observe  Seborrheic keratosis trunk, extremities  Benign, observe  Skin tag L inframammary  Benign, observe.  Return in about 1 year (around 01/13/2021) for TBSE.   Hinda Lenis, MD, am acting as scribe for Brendolyn Patty, MD .

## 2020-03-09 DIAGNOSIS — E559 Vitamin D deficiency, unspecified: Secondary | ICD-10-CM | POA: Diagnosis not present

## 2020-03-09 DIAGNOSIS — E538 Deficiency of other specified B group vitamins: Secondary | ICD-10-CM | POA: Diagnosis not present

## 2020-03-09 DIAGNOSIS — Z79899 Other long term (current) drug therapy: Secondary | ICD-10-CM | POA: Diagnosis not present

## 2020-03-09 DIAGNOSIS — R55 Syncope and collapse: Secondary | ICD-10-CM | POA: Diagnosis not present

## 2020-03-13 ENCOUNTER — Other Ambulatory Visit: Payer: Self-pay

## 2020-03-13 ENCOUNTER — Other Ambulatory Visit (INDEPENDENT_AMBULATORY_CARE_PROVIDER_SITE_OTHER): Payer: Medicare PPO

## 2020-03-13 DIAGNOSIS — E782 Mixed hyperlipidemia: Secondary | ICD-10-CM

## 2020-03-13 DIAGNOSIS — R55 Syncope and collapse: Secondary | ICD-10-CM

## 2020-03-13 DIAGNOSIS — E559 Vitamin D deficiency, unspecified: Secondary | ICD-10-CM

## 2020-03-13 DIAGNOSIS — I2510 Atherosclerotic heart disease of native coronary artery without angina pectoris: Secondary | ICD-10-CM | POA: Diagnosis not present

## 2020-03-13 DIAGNOSIS — R739 Hyperglycemia, unspecified: Secondary | ICD-10-CM

## 2020-03-13 LAB — LIPID PANEL
Cholesterol: 133 mg/dL (ref 0–200)
HDL: 50.1 mg/dL (ref >39.00)
LDL Cholesterol: 62 mg/dL (ref 0–99)
NonHDL: 83.04
Total CHOL/HDL Ratio: 3
Triglycerides: 104 mg/dL (ref 0.0–149.0)
VLDL: 20.8 mg/dL (ref 0.0–40.0)

## 2020-03-13 NOTE — Addendum Note (Signed)
Addended by: Tor Netters I on: 03/13/2020 10:04 AM   Modules accepted: Orders

## 2020-03-14 ENCOUNTER — Other Ambulatory Visit: Payer: Self-pay

## 2020-03-16 ENCOUNTER — Other Ambulatory Visit: Payer: Self-pay

## 2020-03-16 ENCOUNTER — Ambulatory Visit: Payer: Medicare Other | Admitting: Internal Medicine

## 2020-03-16 ENCOUNTER — Encounter: Payer: Self-pay | Admitting: Internal Medicine

## 2020-03-16 VITALS — BP 136/90 | HR 91 | Temp 97.9°F | Ht 60.98 in | Wt 178.6 lb

## 2020-03-16 DIAGNOSIS — Z23 Encounter for immunization: Secondary | ICD-10-CM | POA: Diagnosis not present

## 2020-03-16 DIAGNOSIS — R9401 Abnormal electroencephalogram [EEG]: Secondary | ICD-10-CM | POA: Diagnosis not present

## 2020-03-16 DIAGNOSIS — G629 Polyneuropathy, unspecified: Secondary | ICD-10-CM | POA: Diagnosis not present

## 2020-03-16 DIAGNOSIS — K219 Gastro-esophageal reflux disease without esophagitis: Secondary | ICD-10-CM

## 2020-03-16 DIAGNOSIS — G4700 Insomnia, unspecified: Secondary | ICD-10-CM | POA: Diagnosis not present

## 2020-03-16 DIAGNOSIS — R55 Syncope and collapse: Secondary | ICD-10-CM | POA: Diagnosis not present

## 2020-03-16 DIAGNOSIS — F419 Anxiety disorder, unspecified: Secondary | ICD-10-CM | POA: Diagnosis not present

## 2020-03-16 DIAGNOSIS — R768 Other specified abnormal immunological findings in serum: Secondary | ICD-10-CM | POA: Insufficient documentation

## 2020-03-16 HISTORY — DX: Insomnia, unspecified: G47.00

## 2020-03-16 HISTORY — DX: Abnormal electroencephalogram (EEG): R94.01

## 2020-03-16 MED ORDER — PANTOPRAZOLE SODIUM 40 MG PO TBEC: 40.0000 mg | DELAYED_RELEASE_TABLET | Freq: Every day | ORAL | 3 refills | Status: DC

## 2020-03-16 MED ORDER — TETANUS-DIPHTH-ACELL PERTUSSIS 5-2.5-18.5 LF-MCG/0.5 IM SUSP: 0.5000 mL | Freq: Once | INTRAMUSCULAR | 0 refills | Status: AC

## 2020-03-16 NOTE — Patient Instructions (Addendum)
Call my eye doctor for an appt  Results for Leah Melendez, Leah Melendez (MRN [id number]) as of 03/16/2020 11:28  Ref. Range 03/13/2020 08:51  Cholesterol Latest Ref Range: 0 - 200 mg/dL 133  HDL Cholesterol Latest Ref Range: >39.00 mg/dL 50.10  LDL (calc) Latest Ref Range: 0 - 99 mg/dL 62  NonHDL Unknown 83.04  Triglycerides Latest Ref Range: 0.0 - 149.0 mg/dL 104.0  VLDL Latest Ref Range: 0.0 - 40.0 mg/dL 20.8    Great cholesterol    Food Choices for Gastroesophageal Reflux Disease, Adult When you have gastroesophageal reflux disease (GERD), the foods you eat and your eating habits are very important. Choosing the right foods can help ease the discomfort of GERD. Consider working with a diet and nutrition specialist (dietitian) to help you make healthy food choices. What general guidelines should I follow?  Eating plan  Choose healthy foods low in fat, such as fruits, vegetables, whole grains, low-fat dairy products, and lean meat, fish, and poultry.  Eat frequent, small meals instead of three large meals each day. Eat your meals slowly, in a relaxed setting. Avoid bending over or lying down until 2-3 hours after eating.  Limit high-fat foods such as fatty meats or fried foods.  Limit your intake of oils, butter, and shortening to less than 8 teaspoons each day.  Avoid the following: ? Foods that cause symptoms. These may be different for different people. Keep a food diary to keep track of foods that cause symptoms. ? Alcohol. ? Drinking large amounts of liquid with meals. ? Eating meals during the 2-3 hours before bed.  Cook foods using methods other than frying. This may include baking, grilling, or broiling. Lifestyle  Maintain a healthy weight. Ask your health care provider what weight is healthy for you. If you need to lose weight, work with your health care provider to do so safely.  Exercise for at least 30 minutes on 5 or more days each week, or as told by your health care  provider.  Avoid wearing clothes that fit tightly around your waist and chest.  Do not use any products that contain nicotine or tobacco, such as cigarettes and e-cigarettes. If you need help quitting, ask your health care provider.  Sleep with the head of your bed raised. Use a wedge under the mattress or blocks under the bed frame to raise the head of the bed. What foods are not recommended? The items listed may not be a complete list. Talk with your dietitian about what dietary choices are best for you. Grains Pastries or quick breads with added fat. [place] toast. Vegetables Deep fried vegetables. [place] fries. Any vegetables prepared with added fat. Any vegetables that cause symptoms. For some people this may include tomatoes and tomato products, chili peppers, onions and garlic, and horseradish. Fruits Any fruits prepared with added fat. Any fruits that cause symptoms. For some people this may include citrus fruits, such as oranges, grapefruit, pineapple, and lemons. Meats and other protein foods High-fat meats, such as fatty beef or pork, hot dogs, ribs, ham, sausage, salami and bacon. Fried meat or protein, including fried fish and fried chicken. Nuts and nut butters. Dairy Whole milk and chocolate milk. Sour cream. Cream. Ice cream. Cream cheese. Milk shakes. Beverages Coffee and tea, with or without caffeine. Carbonated beverages. Sodas. Energy drinks. Fruit juice made with acidic fruits (such as orange or grapefruit). Tomato juice. Alcoholic drinks. Fats and oils Butter. Margarine. Shortening. Ghee. Sweets and desserts Chocolate and cocoa. Donuts.  Seasoning and other foods Pepper. Peppermint and spearmint. Any condiments, herbs, or seasonings that cause symptoms. For some people, this may include curry, hot sauce, or vinegar-based salad dressings. Summary  When you have gastroesophageal reflux disease (GERD), food and lifestyle choices are very important to help ease the  discomfort of GERD.  Eat frequent, small meals instead of three large meals each day. Eat your meals slowly, in a relaxed setting. Avoid bending over or lying down until 2-3 hours after eating.  Limit high-fat foods such as fatty meat or fried foods. This information is not intended to replace advice given to you by your health care provider. Make sure you discuss any questions you have with your health care provider. Document Revised: 02/04/2019 Document Reviewed: 10/15/2016 Elsevier Patient Education  [place].

## 2020-03-16 NOTE — Progress Notes (Signed)
Chief Complaint  Patient presents with  . Follow-up    Wants to discuss visit to [facility] on 03/09/2020   F/u  1. Abnormal EEG 11/27/19 abnormal theta waves and getting longer 3-4 hr EEG 03/2020 [facility] neurology and they are considering lamictal MRI had 2020 moderate white matter changes  Cardiac w/u negative   2. H/o syncope 06/2019 and non since then   3. Reviewed labs 03/09/20 cbc normal, A1C 5.3, CMET, TSH normal. She did have elevated IgM neg M spike >230 will CC neurology and h/o to see If this is of concern   Reduced vision since covid vx will sch appt my eye MD   4. Anxiety per pt controlled and sleeping better not having to take xanax prn    Review of Systems  Constitutional: Negative for weight loss.  HENT: Negative for hearing loss.   Eyes: Negative for blurred vision.  Respiratory: Negative for shortness of breath.   Cardiovascular: Negative for chest pain.  Gastrointestinal: Negative for abdominal pain.  Musculoskeletal: Negative for falls.  Skin: Negative for rash.  Neurological: Negative for seizures and loss of consciousness.  Psychiatric/Behavioral: The patient does not have insomnia.    Past Medical History:  Diagnosis Date  . Anxiety   . Basal cell carcinoma    right upper forehead  . Fatty liver 2010  . GERD (gastroesophageal reflux disease)   . H. pylori infection   . Hemangioma 2010  . Hiatal hernia   . Hyperlipidemia   . Liver hemangioma    Past Surgical History:  Procedure Laterality Date  . bilateral lens replacement    . CHOLECYSTECTOMY    . KNEE SURGERY  06/29/2007   Meniscus tear, left knee surgery   Family History  Problem Relation Age of Onset  . Hypertension Sister   . Skin cancer Sister   . Hypertension Brother   . Hypertension Sister   . Heart attack Sister        47 y.o as of 09/10/19   . Colon cancer Neg Hx    Social History   Socioeconomic History  . Marital status: Widowed    Spouse name: Not on file  . Number of children: 2  .  Years of education: Not on file  . Highest education level: Not on file  Occupational History  . Occupation: Retired Garment/textile technologist  Tobacco Use  . Smoking status: Never Smoker  . Smokeless tobacco: Never Used  Substance and Sexual Activity  . Alcohol use: No  . Drug use: No  . Sexual activity: Not Currently  Other Topics Concern  . Not on file  Social History Narrative   Has living will.  Widow as of 04/08/18.  Daughters have a copy of living will and [name] would be HPOA if husband was not able.   1 daughter lives in [place] has 4 kids and 1 daughter in [place] with 2 kids      Would desire CPR.  She would not want heroic measures.            Social Determinants of Health   Financial Resource Strain: Low Risk   . Difficulty of Paying Living Expenses: Not hard at all  Food Insecurity: No Food Insecurity  . Worried About Charity fundraiser in the Last Year: Never true  . Ran Out of Food in the Last Year: Never true  Transportation Needs: No Transportation Needs  . Lack of Transportation (Medical): No  . Lack of  Transportation (Non-Medical): No  Physical Activity:   . Days of Exercise per Week:   . Minutes of Exercise per Session:   Stress: No Stress Concern Present  . Feeling of Stress : Not at all  Social Connections: Unknown  . Frequency of Communication with Friends and Family: More than three times a week  . Frequency of Social Gatherings with Friends and Family: More than three times a week  . Attends Religious Services: Not on file  . Active Member of Clubs or Organizations: Not on file  . Attends Archivist Meetings: Not on file  . Marital Status: Widowed  Intimate Partner Violence: Not At Risk  . Fear of Current or Ex-Partner: No  . Emotionally Abused: No  . Physically Abused: No  . Sexually Abused: No   Current Meds  Medication Sig  . ALPRAZolam (XANAX) 0.25 MG tablet Take 0.5 mg by mouth 2 (two) times daily as needed.    . Ascorbic Acid (VITAMIN C) 500 MG tablet Take 500 mg by mouth daily.    Marland Kitchen aspirin EC 81 MG tablet Take 81 mg by mouth daily.  Marland Kitchen azelastine (ASTELIN) 0.1 % nasal spray Place 2 sprays into both nostrils as needed. Use in each nostril as directed   . Calcium Carbonate-Vitamin D (CALCIUM 600/VITAMIN D PO) Take 1 tablet by mouth 2 (two) times daily.  . Calcium Carbonate-Vitamin D 600-200 MG-UNIT CAPS Take by mouth.  . ezetimibe (ZETIA) 10 MG tablet Take 1 tablet (10 mg total) by mouth daily.  . fish oil-omega-3 fatty acids 1000 MG capsule Take 2 g by mouth daily.   . Fluocinolone Acetonide 0.01 % OIL INT 2 TO 4 GTS IN AU QHS FOR 4 DAYS THEN PRN DRY SKIN AND IRRITATION  . fluticasone (FLONASE) 50 MCG/ACT nasal spray Place into both nostrils daily.  Marland Kitchen loratadine (CLARITIN) 10 MG tablet Take 10 mg by mouth daily as needed for allergies.   . NONFORMULARY OR COMPOUNDED ITEM Achilles Tendonitis Cream[facility] 2 refills  . pantoprazole (PROTONIX) 40 MG tablet Take 1 tablet (40 mg total) by mouth daily. 30 min before food  . simvastatin (ZOCOR) 40 MG tablet Take 1 tablet (40 mg total) by mouth daily.  . vitamin E 400 UNIT capsule Take 400 Units by mouth daily.   . [DISCONTINUED] pantoprazole (PROTONIX) 40 MG tablet TAKE 1 TABLET(40 MG) BY MOUTH DAILY (Patient taking differently: Take 40 mg by mouth daily. )   Allergies  Allergen Reactions  . Dexilant [Dexlansoprazole] Diarrhea  . Sulfonamide Derivatives     REACTION: as child   Recent Results (from the past 2160 hour(s))  Lipid panel     Status: None   Collection Time: 03/13/20  8:51 AM  Result Value Ref Range   Cholesterol 133 0 - 200 mg/dL    Comment: ATP III Classification       Desirable:  < 200 mg/dL               Borderline High:  200 - 239 mg/dL          High:  > = 240 mg/dL   Triglycerides 104.0 0.0 - 149.0 mg/dL    Comment: Normal:  <150 mg/dLBorderline High:  150 - 199 mg/dL   HDL 50.10 >39.00 mg/dL   VLDL 20.8 0.0 - 40.0  mg/dL   LDL Cholesterol 62 0 - 99 mg/dL   Total CHOL/HDL Ratio 3     Comment:  Men          Women1/2 Average Risk     3.4          3.3Average Risk          5.0          4.42X Average Risk          9.6          7.13X Average Risk          15.0          11.0                       NonHDL 83.04     Comment: NOTE:  Non-HDL goal should be 30 mg/dL higher than patient's LDL goal (i.e. LDL goal of < 70 mg/dL, would have non-HDL goal of < 100 mg/dL)   Objective  Body mass index is 33.76 kg/m. Wt Readings from Last 3 Encounters:  03/16/20 178 lb 9.6 oz (81 kg)  11/30/19 178 lb (80.7 kg)  10/26/19 178 lb 8 oz (81 kg)   Temp Readings from Last 3 Encounters:  03/16/20 97.9 F (36.6 C)  09/11/19 98.3 F (36.8 C) (Oral)  09/10/19 (!) 97.3 F (36.3 C) (Skin)   BP Readings from Last 3 Encounters:  03/16/20 136/90  10/26/19 131/79  10/01/19 138/90   Pulse Readings from Last 3 Encounters:  03/16/20 91  10/26/19 68  10/01/19 87    Physical Exam Vitals and nursing note reviewed.  Constitutional:      Appearance: Normal appearance. She is well-developed and well-groomed.  HENT:     Head: Normocephalic and atraumatic.  Eyes:     Conjunctiva/sclera: Conjunctivae normal.     Pupils: Pupils are equal, round, and reactive to light.  Cardiovascular:     Rate and Rhythm: Normal rate and regular rhythm.     Heart sounds: Normal heart sounds. No murmur.  Pulmonary:     Effort: Pulmonary effort is normal.     Breath sounds: Normal breath sounds.  Skin:    General: Skin is warm and dry.  Neurological:     General: No focal deficit present.     Mental Status: She is alert and oriented to person, place, and time. Mental status is at baseline.     Gait: Gait normal.  Psychiatric:        Attention and Perception: Attention and perception normal.        Mood and Affect: Mood and affect normal.        Speech: Speech normal.        Behavior: Behavior normal. Behavior is cooperative.         Thought Content: Thought content normal.        Cognition and Memory: Cognition and memory normal.        Judgment: Judgment normal.     Assessment  Plan  Anxiety Insomnia, unspecified type Improved not taking prn xanax coping better  Gastroesophageal reflux disease without esophagitis - Plan: pantoprazole (PROTONIX) 40 MG tablet  Syncope, unspecified syncope type none since 06/2019 Abnormal EEG Repeat EEG sch 03/2020 and considering lamictal   High total serum IgM Will CC Dr. Manuella Ghazi and H/o to see if this is concerning   Neuropathy polysensory per EMG with Dr. Manuella Ghazi F/u neurology  [facility]  tdap given Rx today shingrix utd prevnar utd pna 23 consider utd  covid 2/2   mammo normal 07/06/19 [facility]  dexa 07/01/2019 osteopenia  Colonoscopy normal 07/25/11 Dr. Deatra Ina f/u in 10 years  Get copy of cologuard [facility] Dr. Matthew Saras per pt  Out of age window pap  Of note sister patsy lough also a pt   Dr. Pryor Ochoa ENT  [facility] ob[facility] 07/01/2019 My eye doctor   Provider: Dr. [name] McLean-Scocuzza-Internal Medicine

## 2020-03-17 NOTE — Progress Notes (Signed)
Per H/o No we do not work up polyclonal increase in immnoglobulins. That can be seen in reactive conditions. If no CRAB criteria, free light chain ratio is normal, nothing to do

## 2020-03-30 ENCOUNTER — Encounter: Payer: Self-pay | Admitting: Internal Medicine

## 2020-03-30 ENCOUNTER — Other Ambulatory Visit: Payer: Self-pay

## 2020-03-30 ENCOUNTER — Ambulatory Visit: Payer: Medicare Other | Admitting: Internal Medicine

## 2020-03-30 VITALS — BP 140/90 | HR 105 | Ht 61.0 in | Wt 179.4 lb

## 2020-03-30 DIAGNOSIS — R55 Syncope and collapse: Secondary | ICD-10-CM

## 2020-03-30 DIAGNOSIS — E7800 Pure hypercholesterolemia, unspecified: Secondary | ICD-10-CM | POA: Diagnosis not present

## 2020-03-30 DIAGNOSIS — I700 Atherosclerosis of aorta: Secondary | ICD-10-CM | POA: Diagnosis not present

## 2020-03-30 DIAGNOSIS — I10 Essential (primary) hypertension: Secondary | ICD-10-CM

## 2020-03-30 NOTE — Patient Instructions (Signed)
Medication Instructions:  Your physician recommends that you continue on your current medications as directed. Please refer to the Current Medication list given to you today.  *If you need a refill on your cardiac medications before your next appointment, please call your pharmacy*   Follow-Up: At [facility], you and your health needs are our priority.  As part of our continuing mission to provide you with exceptional heart care, we have created designated Provider Care Teams.  These Care Teams include your primary Cardiologist (physician) and Advanced Practice Providers (APPs -  Physician Assistants and Nurse Practitioners) who all work together to provide you with the care you need, when you need it.  We recommend signing up for the patient portal called "MyChart".  Sign up information is provided on this After Visit Summary.  MyChart is used to connect with patients for Virtual Visits (Telemedicine).  Patients are able to view lab/test results, encounter notes, upcoming appointments, etc.  Non-urgent messages can be sent to your provider as well.   To learn more about what you can do with MyChart, go to [email or web address].    Your next appointment:   3 month(s)  The format for your next appointment:   In Person  Provider:    You may see DR Harrell Gave END or one of the following Advanced Practice Providers on your designated Care Team:    Murray Hodgkins, NP  Christell Faith, PA-C  Marrianne Mood, PA-C  Other Instructions  Please check your blood pressure and heart rate 1-2 times a week. Please bring your readings to your next appointment.  Information sheet on DASH diet provided.

## 2020-03-30 NOTE — Progress Notes (Signed)
Follow-up Outpatient Visit Date: 03/30/2020  Primary Care Provider: McLean-Scocuzza, Nino Glow, MD [place] [id number]  Chief Complaint: Follow-up syncope  HPI:  Leah Melendez is a 74 y.o. female with history of hyperlipidemia, hiatal hernia, and GERD, who presents for follow-up of syncope.  She was last seen in our office by Dr. Rockey Situ in 09/2019.  She denied having further episodes of dizziness/syncope or tachycardia.  Due to elevated lipids and evidence of aortic atherosclerosis and coronary artery calcification, ezetimibe was added to simvastatin.  Today, Leah Melendez reports that she has been feeling well.  She denies further episodes of syncope as well as dizziness/lightheadedness.  She also has not experienced any chest pain, shortness of breath, or palpitations.  She has mild intermittent lower extremity edema, which is better than at prior visits.  She happily reports that her cholesterol has improved with addition of ezetimibe at her last visit.  She is tolerating this medication as well as simvastatin well.  --------------------------------------------------------------------------------------------------  Past Medical History:  Diagnosis Date  . Anxiety   . Basal cell carcinoma    right upper forehead  . Fatty liver 2010  . GERD (gastroesophageal reflux disease)   . H. pylori infection   . Hemangioma 2010  . Hiatal hernia   . Hyperlipidemia   . Liver hemangioma    Past Surgical History:  Procedure Laterality Date  . bilateral lens replacement    . CHOLECYSTECTOMY    . KNEE SURGERY  06/29/2007   Meniscus tear, left knee surgery    Current Meds  Medication Sig  . ALPRAZolam (XANAX) 0.25 MG tablet Take 0.5 mg by mouth 2 (two) times daily as needed.   . Ascorbic Acid (VITAMIN C) 500 MG tablet Take 500 mg by mouth daily.    Marland Kitchen aspirin EC 81 MG tablet Take 81 mg by mouth daily.  Marland Kitchen azelastine (ASTELIN) 0.1 % nasal spray Place 2 sprays into both  nostrils as needed. Use in each nostril as directed   . Calcium Carbonate-Vitamin D 600-200 MG-UNIT CAPS Take by mouth daily.   Marland Kitchen ezetimibe (ZETIA) 10 MG tablet Take 1 tablet (10 mg total) by mouth daily.  . fish oil-omega-3 fatty acids 1000 MG capsule Take 2 g by mouth daily.   . Fluocinolone Acetonide 0.01 % OIL INT 2 TO 4 GTS IN AU QHS FOR 4 DAYS THEN PRN DRY SKIN AND IRRITATION  . fluticasone (FLONASE) 50 MCG/ACT nasal spray Place into both nostrils daily.  Marland Kitchen loratadine (CLARITIN) 10 MG tablet Take 10 mg by mouth daily as needed for allergies.   . NONFORMULARY OR COMPOUNDED ITEM Achilles Tendonitis Cream[facility] 2 refills  . pantoprazole (PROTONIX) 40 MG tablet Take 1 tablet (40 mg total) by mouth daily. 30 min before food  . simvastatin (ZOCOR) 40 MG tablet Take 1 tablet (40 mg total) by mouth daily.  . vitamin E 400 UNIT capsule Take 400 Units by mouth daily.     Allergies: Dexilant [dexlansoprazole] and Sulfonamide derivatives  Social History   Tobacco Use  . Smoking status: Never Smoker  . Smokeless tobacco: Never Used  Substance Use Topics  . Alcohol use: No  . Drug use: No    Family History  Problem Relation Age of Onset  . Hypertension Sister   . Skin cancer Sister   . Hypertension Brother   . Hypertension Sister   . Heart attack Sister        66 y.o as of 09/10/19   .  Colon cancer Neg Hx     Review of Systems: A 12-system review of systems was performed and was negative except as noted in the HPI.  --------------------------------------------------------------------------------------------------  Physical Exam: BP 140/90 (BP Location: Left Arm, Patient Position: Sitting, Cuff Size: Normal)   Pulse (!) 105   Ht 5\' 1"  (1.549 m)   Wt 179 lb 6 oz (81.4 kg)   SpO2 96%   BMI 33.89 kg/m   Repeat BP 150/90  General: NAD. Neck: No JVD or HJR. Lungs: Clear to auscultation bilaterally without wheezes or crackles. Heart: Tachycardic but regular without  murmurs, rubs, or gallops. Abdomen: Soft, nontender, nondistended. Extremities: No lower extremity edema.  EKG: Sinus tachycardia (heart rate 105 bpm), left axis deviation, inferior Q waves, and poor R wave progression.  Heart rate has increased since 10/01/2019.  Otherwise, no significant interval change.  Lab Results  Component Value Date   WBC 5.5 09/11/2019   HGB 13.6 09/11/2019   HCT 39.3 09/11/2019   MCV 91.8 09/11/2019   PLT 185 09/11/2019    Lab Results  Component Value Date   NA 143 09/11/2019   K 4.0 09/11/2019   CL 110 09/11/2019   CO2 23 09/11/2019   BUN 13 09/11/2019   CREATININE 0.69 09/11/2019   GLUCOSE 102 (H) 09/11/2019   ALT 13 09/10/2019    Lab Results  Component Value Date   CHOL 133 03/13/2020   HDL 50.10 03/13/2020   LDLCALC 62 03/13/2020   TRIG 104.0 03/13/2020   CHOLHDL 3 03/13/2020    --------------------------------------------------------------------------------------------------  ASSESSMENT AND PLAN: Syncope: No further episodes of syncope noted.  Patient is still mildly tachycardic today but asymptomatic.  We will defer medication changes today, though if blood pressure and heart rate remain elevated at future visits, addition of a beta-blocker or nondihydropyridine calcium channel blocker will need to be considered.  Prior echo and myocardial perfusion stress test were unrevealing.  Aortic atherosclerosis and hyperlipidemia: Aortic atherosclerosis incidentally noted on cross-sectional imaging of the chest.  Lipid panel last month showed improved LDL 62.  We will plan to continue current regimen of simvastatin and ezetimibe.  Hypertension: Blood pressure borderline elevated today.  We have agreed to work on lifestyle modifications.  If blood pressure remains elevated, addition of beta-blocker or nondihydropyridine calcium channel blocker should be considered, given concurrent tachycardia.  Follow-up: Return to clinic in 3  months.  Nelva Bush, MD 03/30/2020 11:23 AM

## 2020-04-01 ENCOUNTER — Encounter: Payer: Self-pay | Admitting: Internal Medicine

## 2020-04-01 DIAGNOSIS — E7800 Pure hypercholesterolemia, unspecified: Secondary | ICD-10-CM

## 2020-04-01 DIAGNOSIS — I700 Atherosclerosis of aorta: Secondary | ICD-10-CM | POA: Insufficient documentation

## 2020-04-01 HISTORY — DX: Pure hypercholesterolemia, unspecified: E78.00

## 2020-04-03 ENCOUNTER — Telehealth: Payer: Self-pay | Admitting: Internal Medicine

## 2020-04-03 NOTE — Telephone Encounter (Signed)
Faxed via epic routing

## 2020-04-03 NOTE — Telephone Encounter (Signed)
-----   Message from Delorise Jackson, MD sent at 03/16/2020 12:49 PM EDT ----- Fax to Dr. Manuella Ghazi for response Leah Melendez as wellHello should I be worried about this? Dr. Manuella Ghazi neurology did SPEP blood tests 5/13/21Total IgG - [facility] 1,045 586 - 1,602 mg/dLImmunoglobulin A, Qn, Serum - [facility] 384 64 - 422 mg/dLIgM - [facility] 248 (H) 26 - 217 mg/dLProtein Total - [facility] 7.4 6.0 - 8.5 g/dLAlbumin - [facility] 3.8 2.9 - 4.4 g/dLAlpha-1-Globulin - [facility] 0.3 0.0 - 0.4 g/dLAlpha-2-Globulin - [facility] 0.9 0.4 - 1.0 g/dLBeta Globulin - [facility] 1.2 0.7 - 1.3 g/dLGamma Globulin - [facility] 1.3 0.4 - 1.8 g/dLM-Spike - [facility] Not Observed Not Observed g/dLGlobulin, Total - [facility] 3.6 2.2 - 3.9 g/dLA/G Ratio - [facility] 1.1 0.7 - 1.7 IFE 1 - [facility] Comment (A) Comment: Polyclonal increase detected in one or more immunoglobulins. Please note: - [facility] Comment Comment:Protein electrophoresis scan will follow via computer, mail, orcourier delivery.

## 2020-04-06 DIAGNOSIS — R569 Unspecified convulsions: Secondary | ICD-10-CM | POA: Diagnosis not present

## 2020-07-05 ENCOUNTER — Ambulatory Visit
Admission: EM | Admit: 2020-07-05 | Discharge: 2020-07-05 | Disposition: A | Discharge status: Home / Self Care | Payer: Medicare PPO | Attending: Family Medicine | Admitting: Family Medicine

## 2020-07-05 ENCOUNTER — Other Ambulatory Visit: Payer: Self-pay

## 2020-07-05 DIAGNOSIS — L255 Unspecified contact dermatitis due to plants, except food: Secondary | ICD-10-CM

## 2020-07-05 DIAGNOSIS — R21 Rash and other nonspecific skin eruption: Secondary | ICD-10-CM

## 2020-07-05 MED ORDER — TRIAMCINOLONE ACETONIDE 0.1 % EX CREA: 1.0000 "application " | TOPICAL_CREAM | Freq: Two times a day (BID) | CUTANEOUS | 0 refills | Status: DC

## 2020-07-05 MED ORDER — CEPHALEXIN 500 MG PO CAPS: 500.0000 mg | ORAL_CAPSULE | Freq: Two times a day (BID) | ORAL | 0 refills | Status: AC

## 2020-07-05 NOTE — Discharge Instructions (Addendum)
You have contact dermatitis.   This is a rash that appears when your skin is exposed to an irritant.   Use the steroid cream no more than three times per day.  I have also sent in Keflex for you to take twice a day for 7 days.  Follow up with your primary care provider, or to see [place] as needed.  Report to the emergency room for shortness of breath, high fever, severe diarrhea, or other concerning symptoms.

## 2020-07-05 NOTE — ED Provider Notes (Signed)
[facility]   [id number] 07/05/20 Arrival Time: 82  CC: RASH  SUBJECTIVE:  Leah Melendez is a 74 y.o. female who presents with a skin complaint that began 4 days ago. Reports that she was working outside in her yard, reports that she was scratched by one of the bushes. Reports that the area is now warm, red, tender to touch. Also reports that she was exposed to poison ivy. Reports that she has a rash to her left forearm right upper arm and bilateral ankles. Reports that she has used over-the-counter antiitch creams with no relief. Denies fever, chills, nausea, vomiting, swelling, discharge, oral lesions, SOB, chest pain, abdominal pain, changes in bowel or bladder function.    ROS: As per HPI.  All other pertinent ROS negative.     Past Medical History:  Diagnosis Date  . Anxiety   . Basal cell carcinoma    right upper forehead  . Fatty liver 2010  . GERD (gastroesophageal reflux disease)   . H. pylori infection   . Hemangioma 2010  . Hiatal hernia   . Hyperlipidemia   . Liver hemangioma    Past Surgical History:  Procedure Laterality Date  . bilateral lens replacement    . CHOLECYSTECTOMY    . KNEE SURGERY  06/29/2007   Meniscus tear, left knee surgery   Allergies  Allergen Reactions  . Dexilant [Dexlansoprazole] Diarrhea  . Sulfonamide Derivatives     REACTION: as child   No current facility-administered medications on file prior to encounter.   Current Outpatient Medications on File Prior to Encounter  Medication Sig Dispense Refill  . ALPRAZolam (XANAX) 0.25 MG tablet Take 0.5 mg by mouth 2 (two) times daily as needed.     . Ascorbic Acid (VITAMIN C) 500 MG tablet Take 500 mg by mouth daily.      Marland Kitchen aspirin EC 81 MG tablet Take 81 mg by mouth daily.    Marland Kitchen azelastine (ASTELIN) 0.1 % nasal spray Place 2 sprays into both nostrils as needed. Use in each nostril as directed     . Calcium Carbonate-Vitamin D (CALCIUM 600/VITAMIN D PO) Take 1 tablet by  mouth 2 (two) times daily.    . Calcium Carbonate-Vitamin D 600-200 MG-UNIT CAPS Take by mouth daily.     Marland Kitchen ezetimibe (ZETIA) 10 MG tablet Take 1 tablet (10 mg total) by mouth daily. 90 tablet 3  . fish oil-omega-3 fatty acids 1000 MG capsule Take 2 g by mouth daily.     . Fluocinolone Acetonide 0.01 % OIL INT 2 TO 4 GTS IN AU QHS FOR 4 DAYS THEN PRN DRY SKIN AND IRRITATION    . fluticasone (FLONASE) 50 MCG/ACT nasal spray Place into both nostrils daily.    Marland Kitchen loratadine (CLARITIN) 10 MG tablet Take 10 mg by mouth daily as needed for allergies.     . NONFORMULARY OR COMPOUNDED ITEM Achilles Tendonitis Cream-[facility] 2 refills    . pantoprazole (PROTONIX) 40 MG tablet Take 1 tablet (40 mg total) by mouth daily. 30 min before food 90 tablet 3  . simvastatin (ZOCOR) 40 MG tablet Take 1 tablet (40 mg total) by mouth daily. 90 tablet 3  . vitamin E 400 UNIT capsule Take 400 Units by mouth daily.      Social History   Socioeconomic History  . Marital status: Widowed    Spouse name: Not on file  . Number of children: 2  . Years of education: Not on file  .  Highest education level: Not on file  Occupational History  . Occupation: Retired Garment/textile technologist  Tobacco Use  . Smoking status: Never Smoker  . Smokeless tobacco: Never Used  Vaping Use  . Vaping Use: Never used  Substance and Sexual Activity  . Alcohol use: No  . Drug use: No  . Sexual activity: Not Currently    Birth control/protection: None  Other Topics Concern  . Not on file  Social History Narrative   Has living will.  Widow as of 04/08/18.  Daughters have a copy of living will and [name] would be HPOA if husband was not able.   1 daughter lives in [place] has 4 kids and 1 daughter in [place] with 2 kids      Would desire CPR.  She would not want heroic measures.            Social Determinants of Health   Financial Resource Strain: Low Risk   . Difficulty of Paying Living Expenses:  Not hard at all  Food Insecurity: No Food Insecurity  . Worried About Charity fundraiser in the Last Year: Never true  . Ran Out of Food in the Last Year: Never true  Transportation Needs: No Transportation Needs  . Lack of Transportation (Medical): No  . Lack of Transportation (Non-Medical): No  Physical Activity:   . Days of Exercise per Week: Not on file  . Minutes of Exercise per Session: Not on file  Stress: No Stress Concern Present  . Feeling of Stress : Not at all  Social Connections: Unknown  . Frequency of Communication with Friends and Family: More than three times a week  . Frequency of Social Gatherings with Friends and Family: More than three times a week  . Attends Religious Services: Not on file  . Active Member of Clubs or Organizations: Not on file  . Attends Archivist Meetings: Not on file  . Marital Status: Widowed  Intimate Partner Violence: Not At Risk  . Fear of Current or Ex-Partner: No  . Emotionally Abused: No  . Physically Abused: No  . Sexually Abused: No   Family History  Problem Relation Age of Onset  . Hypertension Sister   . Skin cancer Sister   . Hypertension Brother   . Hypertension Sister   . Heart attack Sister        34 y.o as of 09/10/19   . Colon cancer Neg Hx     OBJECTIVE: Vitals:   07/05/20 1311  BP: (!) 146/84  Pulse: 98  Resp: 18  Temp: 98.5 F (36.9 C)  TempSrc: Oral  SpO2: 96%    General appearance: alert; no distress Head: NCAT Lungs: clear to auscultation bilaterally Heart: regular rate and rhythm.  Radial pulse 2+ bilaterally Extremities: no edema Skin: warm and dry; erythematous, vesicular patchy rash to left forearm, right upper arm and bilateral ankles, also noticed area of erythema, heat, tenderness to the area of the scratch on the left forearm Psychological: alert and cooperative; normal mood and affect  ASSESSMENT & PLAN:  1. Contact dermatitis due to plants, except food, unspecified contact  dermatitis type   2. Rash and nonspecific skin eruption     Meds ordered this encounter  Medications  . cephALEXin (KEFLEX) 500 MG capsule    Sig: Take 1 capsule (500 mg total) by mouth 2 (two) times daily for 7 days.    Dispense:  14 capsule    Refill:  0  Order Specific Question:   Supervising Provider    Answer:   Chase Picket [id number]  . triamcinolone cream (KENALOG) 0.1 %    Sig: Apply 1 application topically 2 (two) times daily.    Dispense:  30 g    Refill:  0    Order Specific Question:   Supervising Provider    Answer:   Chase Picket [[id number]]    Triamcinolone 0.1% (corticosteroid - itch/ inflammation relief) Prescribed keflex Take as prescribed and to completion Avoid hot showers/ baths Moisturize skin daily  Follow up with PCP if symptoms persists Return or go to the ER if you have any new or worsening symptoms such as fever, chills, nausea, vomiting, redness, swelling, discharge, if symptoms do not improve with medications  Reviewed expectations re: course of current medical issues. Questions answered. Outlined signs and symptoms indicating need for more acute intervention. Patient verbalized understanding. After Visit Summary given.   Faustino Congress, NP 07/05/20 1326

## 2020-07-05 NOTE — ED Triage Notes (Signed)
Pt is here with posion ivy over her arms and legs this started after she was in the yard on Saturday, pt has taken anti-itch meds to relieve discomfort.

## 2020-07-10 ENCOUNTER — Ambulatory Visit: Payer: Medicare PPO | Admitting: Dermatology

## 2020-07-10 ENCOUNTER — Other Ambulatory Visit: Payer: Self-pay

## 2020-07-10 ENCOUNTER — Encounter: Payer: Self-pay | Admitting: Dermatology

## 2020-07-10 DIAGNOSIS — D2362 Other benign neoplasm of skin of left upper limb, including shoulder: Secondary | ICD-10-CM

## 2020-07-10 DIAGNOSIS — L578 Other skin changes due to chronic exposure to nonionizing radiation: Secondary | ICD-10-CM | POA: Diagnosis not present

## 2020-07-10 DIAGNOSIS — L821 Other seborrheic keratosis: Secondary | ICD-10-CM | POA: Diagnosis not present

## 2020-07-10 DIAGNOSIS — L814 Other melanin hyperpigmentation: Secondary | ICD-10-CM | POA: Diagnosis not present

## 2020-07-10 DIAGNOSIS — D2371 Other benign neoplasm of skin of right lower limb, including hip: Secondary | ICD-10-CM

## 2020-07-10 DIAGNOSIS — Z85828 Personal history of other malignant neoplasm of skin: Secondary | ICD-10-CM | POA: Diagnosis not present

## 2020-07-10 DIAGNOSIS — L237 Allergic contact dermatitis due to plants, except food: Secondary | ICD-10-CM | POA: Diagnosis not present

## 2020-07-10 DIAGNOSIS — L708 Other acne: Secondary | ICD-10-CM

## 2020-07-10 DIAGNOSIS — L820 Inflamed seborrheic keratosis: Secondary | ICD-10-CM

## 2020-07-10 DIAGNOSIS — D239 Other benign neoplasm of skin, unspecified: Secondary | ICD-10-CM

## 2020-07-10 MED ORDER — CLOBETASOL PROPIONATE 0.05 % EX CREA: TOPICAL_CREAM | CUTANEOUS | 0 refills | Status: DC

## 2020-07-10 NOTE — Progress Notes (Signed)
Follow-Up Visit   Subjective  Leah Melendez is a 74 y.o. female who presents for the following: 6 month follow-up. History of BCC of the right upper forehead and left upper back. She has a new brown spot on her left lower leg that came up this past summer to check and a rough spot on her right popliteal that gets irritated. She went to Urgent Care last week for poison ivy. She was given Cephalexin x 1 week and triamcinolone 0.1% cream.  Still has itchy rash.  The following portions of the chart were reviewed this encounter and updated as appropriate:      Review of Systems:  No other skin or systemic complaints except as noted in HPI or Assessment and Plan.  Objective  Well appearing patient in no apparent distress; mood and affect are within normal limits.  A focused examination was performed including arms, legs. Relevant physical exam findings are noted in the Assessment and Plan.  Objective  Right upper forehead: Well healed scar with no evidence of recurrence.   Objective  Arms, legs, abd: Linear pink papules.  Objective  Left Upper Pretibia x 1, Right popliteal x 1 (2): Erythematous keratotic or waxy stuck-on papule   Objective  Left Wrist: 1.72mm dark blue macule  Objective  Right Spinal Upper Back: Dilated pore.   Assessment & Plan   Actinic Damage - diffuse scaly erythematous macules with underlying dyspigmentation - Recommend daily broad spectrum sunscreen SPF 30+ to sun-exposed areas, reapply every 2 hours as needed.  - Call for new or changing lesions. Seborrheic Keratoses - Stuck-on, waxy, tan-brown papules and plaques  - Discussed benign etiology and prognosis. - Observe - Call for any changes  Lentigines - Scattered tan macules - Discussed due to sun exposure - Benign, observe - Call for any changes  Dermatofibroma - Firm pink/brown papulenodule with dimple sign on right ankle - Benign appearing - Call for any changes   History  of basal cell carcinoma (BCC) Right upper forehead  Clear. Observe for recurrence. Call clinic for new or changing lesions.  Recommend regular skin exams, daily broad-spectrum spf 30+ sunscreen use, and photoprotection.     Allergic contact dermatitis due to plants, except food Arms, legs, abd  d/c TMC 0.1% Cream  Start Clobetasol Cream Spot treat AAs arms, legs, body BID until improved. Avoid face, groin, axilla.  Topical steroids (such as triamcinolone, fluocinolone, fluocinonide, mometasone, clobetasol, halobetasol, betamethasone, hydrocortisone) can cause thinning and lightening of the skin if they are used for too long in the same area. Your physician has selected the right strength medicine for your problem and area affected on the body. Please use your medication only as directed by your physician to prevent side effects.    clobetasol cream (TEMOVATE) 0.05 % - Arms, legs, abd  Inflamed seborrheic keratosis (2) Left Upper Pretibia x 1, Right popliteal x 1  Destruction of lesion - Left Upper Pretibia x 1, Right popliteal x 1  Destruction method: cryotherapy   Informed consent: discussed and consent obtained   Lesion destroyed using liquid nitrogen: Yes   Region frozen until ice ball extended beyond lesion: Yes   Outcome: patient tolerated procedure well with no complications   Post-procedure details: wound care instructions given    Blue nevus Left Wrist  Benign-appearing.  Observation.  Call clinic for new or changing moles.  Recommend daily use of broad spectrum spf 30+ sunscreen to sun-exposed areas.    Dilated pore of [place] Right  Spinal Upper Back  Benign, observe.    Return in about 6 months (around 01/07/2021) for TBSE.   IJamesetta Orleans, CMA, am acting as scribe for Brendolyn Patty, MD .  Documentation: I have reviewed the above documentation for accuracy and completeness, and I agree with the above.  Brendolyn Patty MD

## 2020-07-10 NOTE — Patient Instructions (Addendum)
Cryotherapy Aftercare  . Wash gently with soap and water everyday.   . Apply Vaseline and Band-Aid daily until healed.  Topical steroids (such as triamcinolone, fluocinolone, fluocinonide, mometasone, clobetasol, halobetasol, betamethasone, hydrocortisone) can cause thinning and lightening of the skin if they are used for too long in the same area. Your physician has selected the right strength medicine for your problem and area affected on the body. Please use your medication only as directed by your physician to prevent side effects.    

## 2020-07-25 DIAGNOSIS — R55 Syncope and collapse: Secondary | ICD-10-CM | POA: Diagnosis not present

## 2020-07-25 DIAGNOSIS — R569 Unspecified convulsions: Secondary | ICD-10-CM | POA: Diagnosis not present

## 2020-07-26 ENCOUNTER — Encounter: Payer: Self-pay | Admitting: Internal Medicine

## 2020-07-26 ENCOUNTER — Other Ambulatory Visit: Payer: Self-pay

## 2020-07-26 ENCOUNTER — Ambulatory Visit: Payer: Medicare PPO | Admitting: Internal Medicine

## 2020-07-26 VITALS — BP 140/82 | HR 94 | Ht 61.0 in | Wt 180.2 lb

## 2020-07-26 DIAGNOSIS — R55 Syncope and collapse: Secondary | ICD-10-CM | POA: Diagnosis not present

## 2020-07-26 DIAGNOSIS — I700 Atherosclerosis of aorta: Secondary | ICD-10-CM

## 2020-07-26 DIAGNOSIS — E7800 Pure hypercholesterolemia, unspecified: Secondary | ICD-10-CM

## 2020-07-26 DIAGNOSIS — I10 Essential (primary) hypertension: Secondary | ICD-10-CM | POA: Diagnosis not present

## 2020-07-26 NOTE — Progress Notes (Signed)
Follow-up Outpatient Visit Date: 07/26/2020  Primary Care Provider: McLean-Scocuzza, Nino Glow, MD [place] [address]  Chief Complaint: Follow-up syncope  HPI:  Ms. Weichert is a 74 y.o. female with history of hyperlipidemia, hiatal hernia, and GERD, who presents for follow-up of syncope.  I last saw her in June, which time she was feeling well.  She has not had any further lightheadedness or syncope.  Blood pressure was mildly elevated at that time.  We agreed to a trial of lifestyle modifications before adding pharmacotherapy.  Today, Ms. Fraticelli reports that she has been doing well.  She has not had any further episodes of syncope or lightheadedness.  She recently saw her neurology practice; it was felt that a seizure may have contributed to her episode, the no definite seizure activity has been identified.  She denies chest pain, palpitations, and shortness of breath.  She notes mild lower extremity edema, improved since her prior visit.  Home blood pressures are typically 120-140/70-80.  --------------------------------------------------------------------------------------------------  Past Medical History:  Diagnosis Date  . Anxiety   . Basal cell carcinoma 03/2019   right upper forehead, MOHS  . Basal cell carcinoma 10/2012   Left upper back  . Fatty liver 2010  . GERD (gastroesophageal reflux disease)   . H. pylori infection   . Hemangioma 2010  . Hiatal hernia   . Hyperlipidemia   . Liver hemangioma    Past Surgical History:  Procedure Laterality Date  . bilateral lens replacement    . CHOLECYSTECTOMY    . KNEE SURGERY  06/29/2007   Meniscus tear, left knee surgery    Current Meds  Medication Sig  . ALPRAZolam (XANAX) 0.25 MG tablet Take 0.5 mg by mouth 2 (two) times daily as needed.   . Ascorbic Acid (VITAMIN C) 500 MG tablet Take 500 mg by mouth daily.    Marland Kitchen aspirin EC 81 MG tablet Take 81 mg by mouth daily.  Marland Kitchen azelastine (ASTELIN) 0.1 %  nasal spray Place 2 sprays into both nostrils as needed. Use in each nostril as directed   . Calcium Carbonate-Vitamin D (CALCIUM 600/VITAMIN D PO) Take 1 tablet by mouth 2 (two) times daily.  . Calcium Carbonate-Vitamin D 600-200 MG-UNIT CAPS Take by mouth daily.   . clobetasol cream (TEMOVATE) 0.05 % Spot treat areas of poison ivy twice daily until improved. Avoid face, groin, underarms.  . ezetimibe (ZETIA) 10 MG tablet Take 1 tablet (10 mg total) by mouth daily.  . fish oil-omega-3 fatty acids 1000 MG capsule Take 2 g by mouth daily.   . Fluocinolone Acetonide 0.01 % OIL INT 2 TO 4 GTS IN AU QHS FOR 4 DAYS THEN PRN DRY SKIN AND IRRITATION  . fluticasone (FLONASE) 50 MCG/ACT nasal spray Place into both nostrils daily.  Marland Kitchen loratadine (CLARITIN) 10 MG tablet Take 10 mg by mouth daily as needed for allergies.   . NONFORMULARY OR COMPOUNDED ITEM Achilles Tendonitis Cream-[facility] 2 refills  . pantoprazole (PROTONIX) 40 MG tablet Take 1 tablet (40 mg total) by mouth daily. 30 min before food  . simvastatin (ZOCOR) 40 MG tablet Take 1 tablet (40 mg total) by mouth daily.  Marland Kitchen triamcinolone cream (KENALOG) 0.1 % Apply 1 application topically 2 (two) times daily.  . vitamin E 400 UNIT capsule Take 400 Units by mouth daily.     Allergies: Dexilant [dexlansoprazole] and Sulfonamide derivatives  Social History   Tobacco Use  . Smoking status: Never Smoker  . Smokeless  tobacco: Never Used  Vaping Use  . Vaping Use: Never used  Substance Use Topics  . Alcohol use: No  . Drug use: No    Family History  Problem Relation Age of Onset  . Hypertension Sister   . Skin cancer Sister   . Hypertension Brother   . Hypertension Sister   . Heart attack Sister        38 y.o as of 09/10/19   . Colon cancer Neg Hx     Review of Systems: A 12-system review of systems was performed and was negative except as noted in the  HPI.  --------------------------------------------------------------------------------------------------  Physical Exam: BP 140/82 (BP Location: Left Arm, Patient Position: Sitting, Cuff Size: Normal)   Pulse 94   Ht 5\' 7"  (1.702 m)   Wt 180 lb 4 oz (81.8 kg)   SpO2 98%   BMI 28.23 kg/m   General: NAD. HEENT: No conjunctival pallor or scleral icterus. Facemask in place. Neck: No JVD or HJR.. Lungs: Normal work of breathing. Clear to auscultation bilaterally without wheezes or crackles. Heart: Regular rate and rhythm without murmurs, rubs, or gallops. Abd: Bowel sounds present. Soft, NT/ND. Ext: Trace ankle edema.  EKG: Normal sinus rhythm with septal and inferior Q waves.  No significant change from 03/30/2020.  Lab Results  Component Value Date   WBC 5.5 09/11/2019   HGB 13.6 09/11/2019   HCT 39.3 09/11/2019   MCV 91.8 09/11/2019   PLT 185 09/11/2019    Lab Results  Component Value Date   NA 143 09/11/2019   K 4.0 09/11/2019   CL 110 09/11/2019   CO2 23 09/11/2019   BUN 13 09/11/2019   CREATININE 0.69 09/11/2019   GLUCOSE 102 (H) 09/11/2019   ALT 13 09/10/2019    Lab Results  Component Value Date   CHOL 133 03/13/2020   HDL 50.10 03/13/2020   LDLCALC 62 03/13/2020   TRIG 104.0 03/13/2020   CHOLHDL 3 03/13/2020    --------------------------------------------------------------------------------------------------  ASSESSMENT AND PLAN: Syncope: No recurrence since our last visit.  Cardiac work-up thus far has been unrevealing, including normal LVEF by echo, no significant valvular pathology, and a low risk myocardial perfusion stress test without evidence of ischemia or scar.  We have agreed to defer additional work-up at this time.  Aortic atherosclerosis and hyperlipidemia: LDL well controlled on most recent check (62).  Continue statin therapy.  Hypertension: Blood pressure borderline elevated today but typically better at home.  We have agreed to continue  with lifestyle modifications.  Follow-up: Return to clinic as needed.  Nelva Bush, MD 07/26/2020 11:38 AM

## 2020-07-26 NOTE — Patient Instructions (Signed)

## 2020-07-27 ENCOUNTER — Encounter: Payer: Self-pay | Admitting: Internal Medicine

## 2020-08-02 ENCOUNTER — Telehealth: Payer: Self-pay | Admitting: Internal Medicine

## 2020-08-02 NOTE — Telephone Encounter (Signed)
Call pt and move appt up asap

## 2020-08-04 ENCOUNTER — Ambulatory Visit: Payer: Medicare PPO | Admitting: Internal Medicine

## 2020-08-04 ENCOUNTER — Encounter: Payer: Self-pay | Admitting: Internal Medicine

## 2020-08-04 ENCOUNTER — Other Ambulatory Visit: Payer: Self-pay

## 2020-08-04 ENCOUNTER — Ambulatory Visit (INDEPENDENT_AMBULATORY_CARE_PROVIDER_SITE_OTHER): Payer: Medicare PPO

## 2020-08-04 VITALS — BP 128/76 | HR 107 | Temp 98.3°F | Ht 61.0 in | Wt 180.4 lb

## 2020-08-04 DIAGNOSIS — M25561 Pain in right knee: Secondary | ICD-10-CM | POA: Diagnosis not present

## 2020-08-04 DIAGNOSIS — G8929 Other chronic pain: Secondary | ICD-10-CM

## 2020-08-04 DIAGNOSIS — R000 Tachycardia, unspecified: Secondary | ICD-10-CM

## 2020-08-04 DIAGNOSIS — R739 Hyperglycemia, unspecified: Secondary | ICD-10-CM

## 2020-08-04 DIAGNOSIS — M25562 Pain in left knee: Secondary | ICD-10-CM

## 2020-08-04 DIAGNOSIS — I10 Essential (primary) hypertension: Secondary | ICD-10-CM | POA: Diagnosis not present

## 2020-08-04 DIAGNOSIS — M170 Bilateral primary osteoarthritis of knee: Secondary | ICD-10-CM

## 2020-08-04 DIAGNOSIS — Z1389 Encounter for screening for other disorder: Secondary | ICD-10-CM | POA: Diagnosis not present

## 2020-08-04 DIAGNOSIS — M1712 Unilateral primary osteoarthritis, left knee: Secondary | ICD-10-CM | POA: Diagnosis not present

## 2020-08-04 DIAGNOSIS — E559 Vitamin D deficiency, unspecified: Secondary | ICD-10-CM

## 2020-08-04 DIAGNOSIS — E669 Obesity, unspecified: Secondary | ICD-10-CM

## 2020-08-04 DIAGNOSIS — I83812 Varicose veins of left lower extremities with pain: Secondary | ICD-10-CM | POA: Diagnosis not present

## 2020-08-04 DIAGNOSIS — Z1329 Encounter for screening for other suspected endocrine disorder: Secondary | ICD-10-CM

## 2020-08-04 MED ORDER — METOPROLOL TARTRATE 25 MG PO TABS: 12.5000 mg | ORAL_TABLET | Freq: Two times a day (BID) | ORAL | 3 refills | Status: DC

## 2020-08-04 NOTE — Progress Notes (Signed)
Chief Complaint  Patient presents with  . Follow-up  . Knee Pain    left knee pain for 2 weeks. No known injury, history of knee surgery on the left 10+ years ago   F/u  1. Left knee pain x 2 weeks and had surgery left knee 10 years ago for meniscus issues  C/o mid finger PIP joint pain b/l hands  Left knee pain 7/10  EXAM: LEFT KNEE - COMPLETE 4+ VIEW  COMPARISON:  None.  FINDINGS: No acute fracture or dislocation. No joint effusion. Moderate medial compartment joint space narrowing. Small tricompartmental marginal osteophytes. Bone mineralization is normal. Soft tissues are unremarkable.  IMPRESSION: 1. Moderate medial compartment osteoarthritis.   Electronically Signed   By: Titus Dubin M.D.   On: 08/04/2020 16:57 EXAM: RIGHT KNEE - COMPLETE 4+ VIEW  COMPARISON:  None.  FINDINGS: No acute fracture or dislocation. No joint effusion. Mild to moderate medial compartment joint space narrowing. Bone mineralization is normal. Soft tissues are unremarkable.  IMPRESSION: 1. Mild to moderate medial compartment osteoarthritis.   Electronically Signed   By: Titus Dubin M.D.   On: 08/04/2020 16:56  Xrays +osteoarthritis  2. Varicose veins worse on left leg engorged and painful agreeable to see vascular   Review of Systems  Constitutional: Negative for weight loss.  HENT: Negative for hearing loss.   Eyes: Negative for blurred vision.  Respiratory: Negative for shortness of breath.   Cardiovascular: Negative for chest pain.  Gastrointestinal: Negative for abdominal pain.  Musculoskeletal: Positive for joint pain.  Skin: Negative for rash.  Psychiatric/Behavioral: Negative for depression.   Past Medical History:  Diagnosis Date  . Anxiety   . Basal cell carcinoma 03/2019   right upper forehead, MOHS  . Basal cell carcinoma 10/2012   Left upper back  . Fatty liver 2010  . GERD (gastroesophageal reflux disease)   . H. pylori infection   .  Hemangioma 2010  . Hiatal hernia   . Hyperlipidemia   . Liver hemangioma    Past Surgical History:  Procedure Laterality Date  . bilateral lens replacement    . CHOLECYSTECTOMY    . KNEE SURGERY  06/29/2007   Meniscus tear, left knee surgery   Family History  Problem Relation Age of Onset  . Hypertension Sister   . Skin cancer Sister   . Hypertension Brother   . Hypertension Sister   . Heart attack Sister        20 y.o as of 09/10/19   . Anxiety disorder Sister   . Colon cancer Neg Hx    Social History   Socioeconomic History  . Marital status: Widowed    Spouse name: Not on file  . Number of children: 2  . Years of education: Not on file  . Highest education level: Not on file  Occupational History  . Occupation: Retired Garment/textile technologist  Tobacco Use  . Smoking status: Never Smoker  . Smokeless tobacco: Never Used  Vaping Use  . Vaping Use: Never used  Substance and Sexual Activity  . Alcohol use: No  . Drug use: No  . Sexual activity: Not Currently    Birth control/protection: None  Other Topics Concern  . Not on file  Social History Narrative   Has living will.  Widow as of 04/08/18.  Daughters have a copy of living will and [name] would be HPOA if husband was not able.   1 daughter lives in [place] has 4 kids and  1 daughter in [place] with 2 kids      Would desire CPR.  She would not want heroic measures.            Social Determinants of Health   Financial Resource Strain: Low Risk   . Difficulty of Paying Living Expenses: Not hard at all  Food Insecurity: No Food Insecurity  . Worried About Charity fundraiser in the Last Year: Never true  . Ran Out of Food in the Last Year: Never true  Transportation Needs: No Transportation Needs  . Lack of Transportation (Medical): No  . Lack of Transportation (Non-Medical): No  Physical Activity:   . Days of Exercise per Week: Not on file  . Minutes of Exercise per Session: Not on  file  Stress: No Stress Concern Present  . Feeling of Stress : Not at all  Social Connections: Unknown  . Frequency of Communication with Friends and Family: More than three times a week  . Frequency of Social Gatherings with Friends and Family: More than three times a week  . Attends Religious Services: Not on file  . Active Member of Clubs or Organizations: Not on file  . Attends Archivist Meetings: Not on file  . Marital Status: Widowed  Intimate Partner Violence: Not At Risk  . Fear of Current or Ex-Partner: No  . Emotionally Abused: No  . Physically Abused: No  . Sexually Abused: No   Current Meds  Medication Sig  . ALPRAZolam (XANAX) 0.25 MG tablet Take 0.5 mg by mouth 2 (two) times daily as needed.   . Ascorbic Acid (VITAMIN C) 500 MG tablet Take 500 mg by mouth daily.    Marland Kitchen aspirin EC 81 MG tablet Take 81 mg by mouth daily.  Marland Kitchen azelastine (ASTELIN) 0.1 % nasal spray Place 2 sprays into both nostrils as needed. Use in each nostril as directed   . Calcium Carbonate-Vitamin D (CALCIUM 600/VITAMIN D PO) Take 1 tablet by mouth 2 (two) times daily.  . Calcium Carbonate-Vitamin D 600-200 MG-UNIT CAPS Take by mouth daily.   . clobetasol cream (TEMOVATE) 0.05 % Spot treat areas of poison ivy twice daily until improved. Avoid face, groin, underarms.  . ezetimibe (ZETIA) 10 MG tablet Take 1 tablet (10 mg total) by mouth daily.  . fish oil-omega-3 fatty acids 1000 MG capsule Take 2 g by mouth daily.   . Fluocinolone Acetonide 0.01 % OIL INT 2 TO 4 GTS IN AU QHS FOR 4 DAYS THEN PRN DRY SKIN AND IRRITATION  . fluticasone (FLONASE) 50 MCG/ACT nasal spray Place into both nostrils daily.  Marland Kitchen loratadine (CLARITIN) 10 MG tablet Take 10 mg by mouth daily as needed for allergies.   . NONFORMULARY OR COMPOUNDED ITEM Achilles Tendonitis Cream-[facility] 2 refills  . pantoprazole (PROTONIX) 40 MG tablet Take 1 tablet (40 mg total) by mouth daily. 30 min before food  . simvastatin  (ZOCOR) 40 MG tablet Take 1 tablet (40 mg total) by mouth daily.  Marland Kitchen triamcinolone cream (KENALOG) 0.1 % Apply 1 application topically 2 (two) times daily.  . vitamin E 400 UNIT capsule Take 400 Units by mouth daily.    Allergies  Allergen Reactions  . Dexilant [Dexlansoprazole] Diarrhea  . Sulfonamide Derivatives     REACTION: as child   No results found for this or any previous visit (from the past 2160 hour(s)). Objective  Body mass index is 34.09 kg/m. Wt Readings from Last 3 Encounters:  08/04/20 180 lb 6.4  oz (81.8 kg)  07/26/20 180 lb 4 oz (81.8 kg)  03/30/20 179 lb 6 oz (81.4 kg)   Temp Readings from Last 3 Encounters:  08/04/20 98.3 F (36.8 C) (Oral)  07/05/20 98.5 F (36.9 C) (Oral)  03/16/20 97.9 F (36.6 C)   BP Readings from Last 3 Encounters:  08/04/20 128/76  07/26/20 140/82  07/05/20 (!) 146/84   Pulse Readings from Last 3 Encounters:  08/04/20 (!) 107  07/26/20 94  07/05/20 98    Physical Exam Vitals and nursing note reviewed.  Constitutional:      Appearance: Normal appearance. She is well-developed and well-groomed. She is obese.  HENT:     Head: Normocephalic and atraumatic.  Eyes:     Conjunctiva/sclera: Conjunctivae normal.     Pupils: Pupils are equal, round, and reactive to light.  Cardiovascular:     Rate and Rhythm: Normal rate and regular rhythm.     Heart sounds: Normal heart sounds. No murmur heard.   Pulmonary:     Effort: Pulmonary effort is normal.     Breath sounds: Normal breath sounds.  Musculoskeletal:       Legs:  Skin:    General: Skin is warm and dry.  Neurological:     General: No focal deficit present.     Mental Status: She is alert and oriented to person, place, and time. Mental status is at baseline.     Gait: Gait normal.  Psychiatric:        Attention and Perception: Attention and perception normal.        Mood and Affect: Mood and affect normal.        Speech: Speech normal.        Behavior: Behavior  normal. Behavior is cooperative.        Thought Content: Thought content normal.        Cognition and Memory: Cognition and memory normal.        Judgment: Judgment normal.     Assessment  Plan  Varicose veins of left lower extremity with pain - Plan: Ambulatory referral to Vascular Surgery Dr. Delana Meyer   Chronic pain of both knees - Plan: DG Knee Complete 4 Views Right, DG Knee Complete 4 Views Left Xrays see HPI  Primary hypertension - Plan: metoprolol tartrate (LOPRESSOR) 12.5 MG tablet bid Sinus tachycardia  CC'Dr. End started  Cont monitor BP   Primary osteoarthritis of both knees Refer ortho   Obesity (BMI 30-39.9)  rec healthy diet and exercise   HM Flu shot utd9/24/21 tdap given Rx prev shingrix utd prevnar utd pna 23 consider utd  covid 2/2 pfizer   mammo normal 07/06/19 pfw  dexa 07/01/2019 osteopenia  Colonoscopy normal 07/25/11 Dr. Deatra Ina f/u in 10 years  Get copy of cologuard [facility] Dr. Matthew Saras per pt  Out of age window pap  Of note sister patsy lough also a pt   Dr. Pryor Ochoa ENT  [facility] 07/01/2019 My eye doctor   Provider: Dr. [name] McLean-Scocuzza-[facility] Medicine

## 2020-08-04 NOTE — Patient Instructions (Addendum)
Dr Delana Meyer  [address]  [phone]  Metoprolol Tablets 1/2 pill 2x per day  What is this medicine? METOPROLOL (me TOE proe lole) is a beta blocker. It decreases the amount of work your heart has to do and helps your heart beat regularly. It is used to treat high blood pressure and/or prevent chest pain (also called angina). It is also used after a heart attack to prevent a second one. This medicine may be used for other purposes; ask your health care provider or pharmacist if you have questions. COMMON BRAND NAME(S): Lopressor What should I tell my health care provider before I take this medicine? They need to know if you have any of these conditions:  diabetes  heart or vessel disease like slow heart rate, worsening heart failure, heart block, sick sinus syndrome or Raynaud's disease  kidney disease  liver disease  lung or breathing disease, like asthma or emphysema  pheochromocytoma  thyroid disease  an unusual or allergic reaction to metoprolol, other beta-blockers, medicines, foods, dyes, or preservatives  pregnant or trying to get pregnant  breast-feeding How should I use this medicine? Take this drug by mouth with water. Take it as directed on the prescription label at the same time every day. You can take it with or without food. You should always take it the same way. Keep taking it unless your health care provider tells you to stop. Talk to your health care provider about the use of this drug in children. Special care may be needed. Overdosage: If you think you have taken too much of this medicine contact a poison control center or emergency room at once. NOTE: This medicine is only for you. Do not share this medicine with others. What if I miss a dose? If you miss a dose, take it as soon as you can. If it is almost time for your next dose, take only that dose. Do not take double or extra doses. What may interact with this medicine? This medicine may interact  with the following medications:  certain medicines for blood pressure, heart disease, irregular heart beat  certain medicines for depression like monoamine oxidase (MAO) inhibitors, fluoxetine, or paroxetine  clonidine  dobutamine  epinephrine  isoproterenol  reserpine This list may not describe all possible interactions. Give your health care provider a list of all the medicines, herbs, non-prescription drugs, or dietary supplements you use. Also tell them if you smoke, drink alcohol, or use illegal drugs. Some items may interact with your medicine. What should I watch for while using this medicine? Visit your doctor or health care professional for regular check ups. Contact your doctor right away if your symptoms worsen. Check your blood pressure and pulse rate regularly. Ask your health care professional what your blood pressure and pulse rate should be, and when you should contact them. You may get drowsy or dizzy. Do not drive, use machinery, or do anything that needs mental alertness until you know how this medicine affects you. Do not sit or stand up quickly, especially if you are an older patient. This reduces the risk of dizzy or fainting spells. Contact your doctor if these symptoms continue. Alcohol may interfere with the effect of this medicine. Avoid alcoholic drinks. This medicine may increase blood sugar. Ask your healthcare provider if changes in diet or medicines are needed if you have diabetes. What side effects may I notice from receiving this medicine? Side effects that you should report to your doctor or health  care professional as soon as possible:  allergic reactions like skin rash, itching or hives  cold or numb hands or feet  depression  difficulty breathing  faint  fever with sore throat  irregular heartbeat, chest pain  rapid weight gain   signs and symptoms of high blood sugar such as being more thirsty or hungry or having to urinate more than  normal. You may also feel very tired or have blurry vision.  swollen legs or ankles Side effects that usually do not require medical attention (report to your doctor or health care professional if they continue or are bothersome):  anxiety or nervousness  change in sex drive or performance  dry skin  headache  nightmares or trouble sleeping  short term memory loss  stomach upset or diarrhea This list may not describe all possible side effects. Call your doctor for medical advice about side effects. You may report side effects to FDA at 1-800-FDA-1088. Where should I keep my medicine? Keep out of the reach of children and pets. Store at room temperature between 15 and 30 degrees C (59 and 86 degrees F). Protect from moisture. Keep the container tightly closed. Throw away any unused drug after the expiration date. NOTE: This sheet is a summary. It may not cover all possible information. If you have questions about this medicine, talk to your doctor, pharmacist, or health care provider.  2020 Elsevier/Gold Standard (2019-05-27 17:21:17)  Varicose Veins Varicose veins are veins that have become enlarged, bulged, and twisted. They most often appear in the legs. What are the causes? This condition is caused by damage to the valves in the vein. These valves help blood return to your heart. When they are damaged and they stop working properly, blood may flow backward and back up in the veins near the skin, causing the veins to get larger and appear twisted. The condition can result from any issue that causes blood to back up, like pregnancy, prolonged standing, or obesity. What increases the risk? This condition is more likely to develop in people who are:  On their feet a lot.  Pregnant.  Overweight. What are the signs or symptoms? Symptoms of this condition include:  Bulging, twisted, and bluish veins.  A feeling of heaviness. This may be worse at the end of the day.  Leg  pain. This may be worse at the end of the day.  Swelling in the leg.  Changes in skin color over the veins. How is this diagnosed? This condition may be diagnosed based on your symptoms, a physical exam, and an ultrasound test. How is this treated? Treatment for this condition may involve:  Avoiding sitting or standing in one position for long periods of time.  Wearing compression stockings. These stockings help to prevent blood clots and reduce swelling in the legs.  Raising (elevating) the legs when resting.  Losing weight.  Exercising regularly. If you have persistent symptoms or want to improve the way your varicose veins look, you may choose to have a procedure to close the varicose veins off or to remove them. Treatments to close off the veins include:  Sclerotherapy. In this treatment, a solution is injected into a vein to close it off.  Laser treatment. In this treatment, the vein is heated with a laser to close it off.  Radiofrequency vein ablation. In this treatment, an electrical current produced by radio waves is used to close off the vein. Treatments to remove the veins include:  Phlebectomy. In this  treatment, the veins are removed through small incisions made over the veins.  Vein ligation and stripping. In this treatment, incisions are made over the veins. The veins are then removed after being tied (ligated) with stitches (sutures). Follow these instructions at home: Activity  Walk as much as possible. Walking increases blood flow. This helps blood return to the heart and takes pressure off your veins. It also increases your cardiovascular strength.  Follow your health care provider's instructions about exercising.  Do not stand or sit in one position for a long period of time.  Do not sit with your legs crossed.  Rest with your legs raised during the day. General instructions   Follow any diet instructions given to you by your health care  provider.  Wear compression stockings as directed by your health care provider. Do not wear other kinds of tight clothing around your legs, pelvis, or waist.  Elevate your legs at night to above the level of your heart.  If you get a cut in the skin over the varicose vein and the vein bleeds: ? Lie down with your leg raised. ? Apply firm pressure to the cut with a clean cloth until the bleeding stops. ? Place a bandage (dressing) on the cut. Contact a health care provider if:  The skin around your varicose veins starts to break down.  You have pain, redness, tenderness, or hard swelling over a vein.  You are uncomfortable because of pain.  You get a cut in the skin over a varicose vein and it will not stop bleeding. Summary  Varicose veins are veins that have become enlarged, bulged, and twisted. They most often appear in the legs.  This condition is caused by damage to the valves in the vein. These valves help blood return to your heart.  Treatment for this condition includes frequent movements, wearing compression stockings, losing weight, and exercising regularly. In some cases, procedures are done to close off or remove the veins.  Treatment for this condition may include wearing compression stockings, elevating the legs, losing weight, and engaging in regular activity. In some cases, procedures are done to close off or remove the veins. This information is not intended to replace advice given to you by your health care provider. Make sure you discuss any questions you have with your health care provider. Document Revised: 12/10/2018 Document Reviewed: 11/06/2016 Elsevier Patient Education  [employer].

## 2020-08-07 ENCOUNTER — Other Ambulatory Visit (INDEPENDENT_AMBULATORY_CARE_PROVIDER_SITE_OTHER): Payer: Self-pay | Admitting: Vascular Surgery

## 2020-08-07 DIAGNOSIS — I83812 Varicose veins of left lower extremities with pain: Secondary | ICD-10-CM

## 2020-08-08 ENCOUNTER — Other Ambulatory Visit: Payer: Self-pay

## 2020-08-08 ENCOUNTER — Encounter (INDEPENDENT_AMBULATORY_CARE_PROVIDER_SITE_OTHER): Payer: Self-pay | Admitting: Nurse Practitioner

## 2020-08-08 ENCOUNTER — Ambulatory Visit (INDEPENDENT_AMBULATORY_CARE_PROVIDER_SITE_OTHER): Payer: Medicare PPO | Admitting: Nurse Practitioner

## 2020-08-08 ENCOUNTER — Ambulatory Visit (INDEPENDENT_AMBULATORY_CARE_PROVIDER_SITE_OTHER): Payer: Medicare PPO

## 2020-08-08 VITALS — BP 138/74 | HR 57 | Ht 61.0 in | Wt 188.0 lb

## 2020-08-08 DIAGNOSIS — I83812 Varicose veins of left lower extremities with pain: Secondary | ICD-10-CM | POA: Diagnosis not present

## 2020-08-08 DIAGNOSIS — I10 Essential (primary) hypertension: Secondary | ICD-10-CM

## 2020-08-08 DIAGNOSIS — I890 Lymphedema, not elsewhere classified: Secondary | ICD-10-CM | POA: Diagnosis not present

## 2020-08-08 DIAGNOSIS — M170 Bilateral primary osteoarthritis of knee: Secondary | ICD-10-CM | POA: Insufficient documentation

## 2020-08-08 DIAGNOSIS — E669 Obesity, unspecified: Secondary | ICD-10-CM | POA: Insufficient documentation

## 2020-08-09 DIAGNOSIS — Z6834 Body mass index (BMI) 34.0-34.9, adult: Secondary | ICD-10-CM | POA: Diagnosis not present

## 2020-08-09 DIAGNOSIS — Z1231 Encounter for screening mammogram for malignant neoplasm of breast: Secondary | ICD-10-CM | POA: Diagnosis not present

## 2020-08-09 DIAGNOSIS — Z01419 Encounter for gynecological examination (general) (routine) without abnormal findings: Secondary | ICD-10-CM | POA: Diagnosis not present

## 2020-08-09 LAB — HM MAMMOGRAPHY

## 2020-08-13 ENCOUNTER — Encounter (INDEPENDENT_AMBULATORY_CARE_PROVIDER_SITE_OTHER): Payer: Self-pay | Admitting: Nurse Practitioner

## 2020-08-13 NOTE — Progress Notes (Signed)
Subjective:    Patient ID: Leah Melendez, female    DOB: 26-Sep-1946, 74 y.o.   MRN: [id number] Chief Complaint  Patient presents with  . New Patient (Initial Visit)    [facility]. Varicose veins e/ pains Ble reflux    The patient is seen for evaluation of symptomatic varicose veins. The patient relates burning and stinging which worsened steadily throughout the course of the day, particularly with standing. The patient also notes an aching and throbbing pain over the varicosities, particularly with prolonged dependent positions. The symptoms are significantly improved with elevation.  The patient also notes that during hot weather the symptoms are greatly intensified.  There is no history of DVT, PE or superficial thrombophlebitis. There is no history of ulceration or hemorrhage. The patient denies a significant family history of varicose veins.   The patient has not worn graduated compression in the past. At the present time the patient has not been using over-the-counter analgesics. There is no history of prior surgical intervention or sclerotherapy.  Today noninvasive studies show no evidence of DVT or superficial venous thrombosis in the left lower extremity.  No evidence of reflux noted in the short saphenous vein.  There is evidence of reflux noted in the left common femoral vein.  There is also reflux noted in the great saphenous vein at the saphenofemoral junction as well as at the mid thigh.   Review of Systems  Musculoskeletal:       Tenderness  All other systems reviewed and are negative.      Objective:   Physical Exam Vitals reviewed.  HENT:     Head: Normocephalic.  Cardiovascular:     Rate and Rhythm: Normal rate and regular rhythm.     Pulses: Normal pulses.     Comments: Varicosities on RLE  Pulmonary:     Effort: Pulmonary effort is normal.  Skin:    General: Skin is warm and dry.  Neurological:     Mental Status: She is alert and oriented to person,  place, and time.  Psychiatric:        Mood and Affect: Mood normal.        Behavior: Behavior normal.        Thought Content: Thought content normal.        Judgment: Judgment normal.     BP 138/74   Pulse (!) 57   Ht 5\' 1"  (1.549 m)   Wt 188 lb (85.3 kg)   BMI 35.52 kg/m   Past Medical History:  Diagnosis Date  . Anxiety   . Basal cell carcinoma 03/2019   right upper forehead, MO[facility]  . Basal cell carcinoma 10/2012   Left upper back  . Fatty liver 2010  . GERD (gastroesophageal reflux disease)   . H. pylori infection   . Hemangioma 2010  . Hiatal hernia   . Hyperlipidemia   . Liver hemangioma     Social History   Socioeconomic History  . Marital status: Widowed    Spouse name: Not on file  . Number of children: 2  . Years of education: Not on file  . Highest education level: Not on file  Occupational History  . Occupation: Retired Garment/textile technologist  Tobacco Use  . Smoking status: Never Smoker  . Smokeless tobacco: Never Used  Vaping Use  . Vaping Use: Never used  Substance and Sexual Activity  . Alcohol use: No  . Drug use: No  . Sexual activity: Not Currently  Birth control/protection: None  Other Topics Concern  . Not on file  Social History Narrative   Has living will.  Widow as of 04/08/18.  Daughters have a copy of living will and [name] would be HPOA if husband was not able.   1 daughter lives in [place] has 4 kids and 1 daughter in [place] with 2 kids      Would desire CPR.  She would not want heroic measures.            Social Determinants of Health   Financial Resource Strain: Low Risk   . Difficulty of Paying Living Expenses: Not hard at all  Food Insecurity: No Food Insecurity  . Worried About Charity fundraiser in the Last Year: Never true  . Ran Out of Food in the Last Year: Never true  Transportation Needs: No Transportation Needs  . Lack of Transportation (Medical): No  . Lack of Transportation  (Non-Medical): No  Physical Activity:   . Days of Exercise per Week: Not on file  . Minutes of Exercise per Session: Not on file  Stress: No Stress Concern Present  . Feeling of Stress : Not at all  Social Connections: Unknown  . Frequency of Communication with Friends and Family: More than three times a week  . Frequency of Social Gatherings with Friends and Family: More than three times a week  . Attends Religious Services: Not on file  . Active Member of Clubs or Organizations: Not on file  . Attends Archivist Meetings: Not on file  . Marital Status: Widowed  Intimate Partner Violence: Not At Risk  . Fear of Current or Ex-Partner: No  . Emotionally Abused: No  . Physically Abused: No  . Sexually Abused: No    Past Surgical History:  Procedure Laterality Date  . bilateral lens replacement    . CHOLECYSTECTOMY    . KNEE SURGERY  06/29/2007   Meniscus tear, left knee surgery    Family History  Problem Relation Age of Onset  . Hypertension Sister   . Skin cancer Sister   . Hypertension Brother   . Hypertension Sister   . Heart attack Sister        1 y.o as of 09/10/19   . Anxiety disorder Sister   . Colon cancer Neg Hx     Allergies  Allergen Reactions  . Dexilant [Dexlansoprazole] Diarrhea  . Sulfonamide Derivatives     REACTION: as child       Assessment & Plan:   1. Varicose veins of left lower extremity with pain Recommend:  The patient is complaining of varicose veins.    I have had a long discussion with the patient regarding  varicose veins and why they cause symptoms.  Patient will begin wearing graduated compression stockings on a daily basis, beginning first thing in the morning and removing them in the evening. The patient is instructed specifically not to sleep in the stockings.    The patient  will also begin using over-the-counter analgesics such as Motrin 600 mg po TID to help control the symptoms as needed.    In addition,  behavioral modification including elevation during the day will be initiated, utilizing a recliner was recommended.  The patient is also instructed to continue exercising such as walking 4-5 times per week.  At this time the patient wishes to continue conservative therapy and is not interested in more invasive treatments such as laser ablation and sclerotherapy.  The  Patient will follow up PRN if the symptoms worsen.  2. Lymphedema No surgery or intervention at this point in time.  I have reviewed my discussion with the patient regarding venous insufficiency and why it causes symptoms. I have discussed with the patient the chronic skin changes that accompany venous insufficiency and the long term sequela such as ulceration. Patient will contnue wearing graduated compression stockings on a daily basis, as this has provided excellent control of his edema. The patient will put the stockings on first thing in the morning and removing them in the evening. The patient is reminded not to sleep in the stockings.  In addition, behavioral modification including elevation during the day will be initiated. Exercise is strongly encouraged.   Given the patient's good control and lack of any problems regarding the venous insufficiency and lymphedema a lymph pump in not need at this time.  The patient will follow up with me PRN should anything change.  The patient voices agreement with this plan.   3. Essential hypertension Continue antihypertensive medications as already ordered, these medications have been reviewed and there are no changes at this time.    Current Outpatient Medications on File Prior to Visit  Medication Sig Dispense Refill  . ALPRAZolam (XANAX) 0.25 MG tablet Take 0.5 mg by mouth 2 (two) times daily as needed.     . Ascorbic Acid (VITAMIN C) 500 MG tablet Take 500 mg by mouth daily.      Marland Kitchen aspirin EC 81 MG tablet Take 81 mg by mouth daily.    Marland Kitchen azelastine (ASTELIN) 0.1 % nasal spray  Place 2 sprays into both nostrils as needed. Use in each nostril as directed     . Calcium Carbonate-Vitamin D (CALCIUM 600/VITAMIN D PO) Take 1 tablet by mouth 2 (two) times daily.    . Calcium Carbonate-Vitamin D 600-200 MG-UNIT CAPS Take by mouth daily.     . clobetasol cream (TEMOVATE) 0.05 % Spot treat areas of poison ivy twice daily until improved. Avoid face, groin, underarms. 45 g 0  . ezetimibe (ZETIA) 10 MG tablet Take 1 tablet (10 mg total) by mouth daily. 90 tablet 3  . fish oil-omega-3 fatty acids 1000 MG capsule Take 2 g by mouth daily.     . Fluocinolone Acetonide 0.01 % OIL INT 2 TO 4 GTS IN AU QHS FOR 4 DAYS THEN PRN DRY SKIN AND IRRITATION    . fluticasone (FLONASE) 50 MCG/ACT nasal spray Place into both nostrils daily.    Marland Kitchen loratadine (CLARITIN) 10 MG tablet Take 10 mg by mouth daily as needed for allergies.     . metoprolol tartrate (LOPRESSOR) 25 MG tablet Take 0.5 tablets (12.5 mg total) by mouth 2 (two) times daily. Please cut in 1/2 for patient 90 tablet 3  . NONFORMULARY OR COMPOUNDED ITEM Achilles Tendonitis Cream[facility] 2 refills    . pantoprazole (PROTONIX) 40 MG tablet Take 1 tablet (40 mg total) by mouth daily. 30 min before food 90 tablet 3  . simvastatin (ZOCOR) 40 MG tablet Take 1 tablet (40 mg total) by mouth daily. 90 tablet 3  . triamcinolone cream (KENALOG) 0.1 % Apply 1 application topically 2 (two) times daily. 30 g 0  . vitamin E 400 UNIT capsule Take 400 Units by mouth daily.      No current facility-administered medications on file prior to visit.    There are no Patient Instructions on file for this visit. No follow-ups on file.   Arna Medici  Earley Favor, NP

## 2020-08-14 DIAGNOSIS — M25562 Pain in left knee: Secondary | ICD-10-CM | POA: Insufficient documentation

## 2020-08-14 DIAGNOSIS — G8929 Other chronic pain: Secondary | ICD-10-CM | POA: Insufficient documentation

## 2020-08-14 NOTE — Addendum Note (Signed)
Addended by: Orland Mustard on: 08/14/2020 09:05 AM   Modules accepted: Orders

## 2020-09-08 ENCOUNTER — Other Ambulatory Visit: Payer: Self-pay

## 2020-09-08 ENCOUNTER — Other Ambulatory Visit (INDEPENDENT_AMBULATORY_CARE_PROVIDER_SITE_OTHER): Payer: Medicare PPO

## 2020-09-08 DIAGNOSIS — Z0189 Encounter for other specified special examinations: Secondary | ICD-10-CM | POA: Diagnosis not present

## 2020-09-08 DIAGNOSIS — Z1329 Encounter for screening for other suspected endocrine disorder: Secondary | ICD-10-CM

## 2020-09-08 DIAGNOSIS — R000 Tachycardia, unspecified: Secondary | ICD-10-CM | POA: Diagnosis not present

## 2020-09-08 DIAGNOSIS — E559 Vitamin D deficiency, unspecified: Secondary | ICD-10-CM

## 2020-09-08 DIAGNOSIS — I10 Essential (primary) hypertension: Secondary | ICD-10-CM | POA: Diagnosis not present

## 2020-09-08 DIAGNOSIS — R739 Hyperglycemia, unspecified: Secondary | ICD-10-CM | POA: Diagnosis not present

## 2020-09-08 DIAGNOSIS — Z1389 Encounter for screening for other disorder: Secondary | ICD-10-CM

## 2020-09-08 LAB — COMPREHENSIVE METABOLIC PANEL
ALT: 11 U/L (ref 0–35)
AST: 14 U/L (ref 0–37)
Albumin: 4 g/dL (ref 3.5–5.2)
Alkaline Phosphatase: 61 U/L (ref 39–117)
BUN: 13 mg/dL (ref 6–23)
CO2: 25 mEq/L (ref 19–32)
Calcium: 9 mg/dL (ref 8.4–10.5)
Chloride: 106 mEq/L (ref 96–112)
Creatinine, Ser: 0.81 mg/dL (ref 0.40–1.20)
GFR: 70.73 mL/min (ref >60.00)
Glucose, Bld: 88 mg/dL (ref 70–99)
Potassium: 4.2 mEq/L (ref 3.5–5.1)
Sodium: 140 mEq/L (ref 135–145)
Total Bilirubin: 1.6 mg/dL — ABNORMAL HIGH (ref 0.2–1.2)
Total Protein: 6.8 g/dL (ref 6.0–8.3)

## 2020-09-08 LAB — CBC WITH DIFFERENTIAL/PLATELET
Basophils Absolute: 0 10*3/uL (ref 0.0–0.1)
Basophils Relative: 0.3 % (ref 0.0–3.0)
Eosinophils Absolute: 0.1 10*3/uL (ref 0.0–0.7)
Eosinophils Relative: 2.8 % (ref 0.0–5.0)
HCT: 39.3 % (ref 36.0–46.0)
Hemoglobin: 13.7 g/dL (ref 12.0–15.0)
Lymphocytes Relative: 31.1 % (ref 12.0–46.0)
Lymphs Abs: 1.6 10*3/uL (ref 0.7–4.0)
MCHC: 34.9 g/dL (ref 30.0–36.0)
MCV: 90.9 fl (ref 78.0–100.0)
Monocytes Absolute: 0.4 10*3/uL (ref 0.1–1.0)
Monocytes Relative: 8.3 % (ref 3.0–12.0)
Neutro Abs: 2.9 10*3/uL (ref 1.4–7.7)
Neutrophils Relative %: 57.5 % (ref 43.0–77.0)
Platelets: 172 10*3/uL (ref 150.0–400.0)
RBC: 4.32 Mil/uL (ref 3.87–5.11)
RDW: 12.3 % (ref 11.5–15.5)
WBC: 5.1 10*3/uL (ref 4.0–10.5)

## 2020-09-08 LAB — LIPID PANEL
Cholesterol: 141 mg/dL (ref 0–200)
HDL: 53.5 mg/dL (ref >39.00)
LDL Cholesterol: 67 mg/dL (ref 0–99)
NonHDL: 87.04
Total CHOL/HDL Ratio: 3
Triglycerides: 100 mg/dL (ref 0.0–149.0)
VLDL: 20 mg/dL (ref 0.0–40.0)

## 2020-09-08 LAB — HEMOGLOBIN A1C: Hgb A1c MFr Bld: 5.4 % (ref 4.6–6.5)

## 2020-09-08 LAB — TSH: TSH: 4.31 u[IU]/mL (ref 0.35–4.50)

## 2020-09-08 LAB — VITAMIN D 25 HYDROXY (VIT D DEFICIENCY, FRACTURES): VITD: 38.54 ng/mL (ref 30.00–100.00)

## 2020-09-09 ENCOUNTER — Other Ambulatory Visit: Payer: Self-pay | Admitting: Cardiovascular Disease

## 2020-09-09 LAB — URINALYSIS, ROUTINE W REFLEX MICROSCOPIC
Bilirubin Urine: NEGATIVE
Glucose, UA: NEGATIVE
Hgb urine dipstick: NEGATIVE
Nitrite: NEGATIVE
Specific Gravity, Urine: 1.019 (ref 1.001–1.03)
pH: <=5 (ref 5.0–8.0)

## 2020-09-11 ENCOUNTER — Other Ambulatory Visit: Payer: Self-pay | Admitting: Internal Medicine

## 2020-09-11 ENCOUNTER — Other Ambulatory Visit: Payer: Self-pay

## 2020-09-11 DIAGNOSIS — R748 Abnormal levels of other serum enzymes: Secondary | ICD-10-CM

## 2020-09-11 DIAGNOSIS — R8271 Bacteriuria: Secondary | ICD-10-CM

## 2020-09-12 ENCOUNTER — Other Ambulatory Visit (INDEPENDENT_AMBULATORY_CARE_PROVIDER_SITE_OTHER): Payer: Medicare PPO

## 2020-09-12 ENCOUNTER — Other Ambulatory Visit: Payer: Self-pay

## 2020-09-12 DIAGNOSIS — R8271 Bacteriuria: Secondary | ICD-10-CM | POA: Diagnosis not present

## 2020-09-13 LAB — URINE CULTURE
MICRO NUMBER:: 11209405
Result:: NO GROWTH
SPECIMEN QUALITY:: ADEQUATE

## 2020-09-14 ENCOUNTER — Ambulatory Visit
Admission: RE | Admit: 2020-09-14 | Discharge: 2020-09-14 | Disposition: A | Discharge status: Home / Self Care | Payer: Medicare PPO | Source: Ambulatory Visit | Attending: Internal Medicine | Admitting: Internal Medicine

## 2020-09-14 ENCOUNTER — Other Ambulatory Visit: Payer: Self-pay

## 2020-09-14 DIAGNOSIS — R945 Abnormal results of liver function studies: Secondary | ICD-10-CM | POA: Diagnosis not present

## 2020-09-14 DIAGNOSIS — R748 Abnormal levels of other serum enzymes: Secondary | ICD-10-CM | POA: Diagnosis not present

## 2020-09-27 ENCOUNTER — Ambulatory Visit: Payer: Medicare PPO | Admitting: Internal Medicine

## 2020-09-27 ENCOUNTER — Other Ambulatory Visit: Payer: Self-pay

## 2020-09-27 ENCOUNTER — Encounter: Payer: Self-pay | Admitting: Internal Medicine

## 2020-09-27 VITALS — BP 124/76 | HR 74 | Temp 97.5°F | Ht 61.0 in | Wt 179.4 lb

## 2020-09-27 DIAGNOSIS — R000 Tachycardia, unspecified: Secondary | ICD-10-CM | POA: Diagnosis not present

## 2020-09-27 DIAGNOSIS — M25562 Pain in left knee: Secondary | ICD-10-CM | POA: Diagnosis not present

## 2020-09-27 DIAGNOSIS — J4520 Mild intermittent asthma, uncomplicated: Secondary | ICD-10-CM

## 2020-09-27 DIAGNOSIS — E785 Hyperlipidemia, unspecified: Secondary | ICD-10-CM | POA: Diagnosis not present

## 2020-09-27 DIAGNOSIS — G8929 Other chronic pain: Secondary | ICD-10-CM | POA: Diagnosis not present

## 2020-09-27 DIAGNOSIS — M25561 Pain in right knee: Secondary | ICD-10-CM

## 2020-09-27 MED ORDER — SIMVASTATIN 40 MG PO TABS
40.0000 mg | ORAL_TABLET | Freq: Every day | ORAL | 3 refills | Status: DC
Start: 2020-09-27 — End: 2021-09-11

## 2020-09-27 MED ORDER — EZETIMIBE 10 MG PO TABS: ORAL_TABLET | ORAL | 3 refills | Status: DC

## 2020-09-27 NOTE — Patient Instructions (Addendum)
Try gas x  tums for heartburn or pepcid over the counter  Consider pfizer booster    Call me back if you want an albuterol inhaler   Bronchospasm, Adult  Bronchospasm is when airways in the lungs get smaller. When this happens, it can be hard to breathe. You may cough. You may also make a whistling sound when you breathe (wheeze). Follow these instructions at home: Medicines  Take over-the-counter and prescription medicines only as told by your doctor.  If you need to use an inhaler or nebulizer to take your medicine, ask your doctor how to use it.  If you were given a spacer, always use it with your inhaler. Lifestyle  Change your heating and air conditioning filter. Do this at least once a month.  Try not to use fireplaces and wood stoves.  Do not  smoke. Do not  allow smoking in your home.  Try not to use things that have a strong smell, like perfume.  Get rid of pests (such as roaches and mice) and their poop.  Remove any mold from your home.  Keep your house clean. Get rid of dust.  Use cleaning products that have no smell.  Replace carpet with wood, tile, or vinyl flooring.  Use allergy-proof pillows, mattress covers, and box spring covers.  Wash bed sheets and blankets every week. Use hot water. Dry them in a dryer.  Use blankets that are made of polyester or cotton.  Wash your hands often.  Keep pets out of your bedroom.  When you exercise, try not to breathe in cold air. General instructions  Have a plan for getting medical care. Know these things: ? When to call your doctor. ? When to call local emergency services (911 in the [place] ? Where to go in an emergency.  Stay up to date on your shots (immunizations).  When you have an episode: ? Stay calm. ? Relax. ? Breathe slowly. Contact a doctor if:  Your muscles ache.  Your chest hurts.  The color of the mucus you cough up (sputum) changes from clear or white to yellow, green, gray, or  bloody.  The mucus you cough up gets thicker.  You have a fever. Get help right away if:  The whistling sound gets worse, even after you take your medicines.  Your coughing gets worse.  You find it even harder to breathe.  Your chest hurts very much. Summary  Bronchospasm is when airways in the lungs get smaller.  When this happens, it can be hard to breathe. You may cough. You may also make a whistling sound when you breathe.  Stay away from things that cause you to have episodes. These include smoke or dust. This information is not intended to replace advice given to you by your health care provider. Make sure you discuss any questions you have with your health care provider. Document Revised: 09/26/2017 Document Reviewed: 10/17/2016 Elsevier Patient Education  2020 [place] is a feeling of pain, discomfort, burning, or fullness in the upper part of your abdomen. It can come and go. It may occur frequently or rarely. Indigestion tends to occur while you are eating or right after you have finished eating. It may be worse at night and while bending over or lying down. Indigestion may be a symptom of an underlying digestive condition. Follow these instructions at home: Eating and drinking   Follow an eating plan as recommended by your health care provider.  Avoid certain  foods and drinks as told by your health care provider. This may include: ? Chocolate and cocoa. ? Peppermint and mint flavorings. ? Garlic and onions. ? Horseradish. ? Spicy and acidic foods, including peppers, chili powder, curry powder, vinegar, hot sauces, and barbecue sauce. ? Citrus fruits, such as oranges, lemons, and limes. ? Tomato-based foods, such as red sauce, chili, salsa, and pizza with red sauce. ? Fried and fatty foods, such as donuts, french fries, potato chips, and high-fat dressings. ? High-fat meats, such as hot dogs and fatty cuts of red and white meats, such  as rib eye steak, sausage, ham, and bacon. ? High-fat dairy items, such as whole milk, butter, and cream cheese. ? Coffee and tea (with or without caffeine). ? Drinks that contain alcohol. ? Energy drinks and sports drinks. ? Carbonated drinks or sodas. ? Citrus fruit juices.  Eat small, frequent meals instead of large meals.  Avoid drinking large amounts of liquid with your meals.  Avoid eating meals during the 2-3 hours before bedtime.  Avoid lying down right after you eat.  Avoid exercise for 2 hours after you eat. Lifestyle      Maintain a healthy weight. Ask your health care provider what weight is healthy for you. If you need to lose weight, work with your health care provider to do so safely.  Exercise for at least 30 minutes on 5 or more days each week, or as told by your health care provider. Avoid exercises that include bending forward. This can make your symptoms worse.  Wear loose-fitting clothing. Do not wear anything tight around your waist that causes pressure on your abdomen.  Do not use any products that contain nicotine or tobacco, such as cigarettes, e-cigarettes, and chewing tobacco. These can make symptoms worse. If you need help quitting, ask your health care provider.  Raise (elevate) the head of your bed about 6 inches (15 cm) when you sleep.  Try to reduce your stress, such as with yoga or meditation. If you need help reducing stress, ask your health care provider. General instructions  Take over-the-counter and prescription medicines only as told by your health care provider. Do not take aspirin, ibuprofen, or other NSAIDs unless your health care provider told you to do so.  Pay attention to any changes in your symptoms.  Keep all follow-up visits as told by your health care provider. This is important. Contact a health care provider if:  You have new symptoms.  You have unexplained weight loss.  You have difficulty swallowing, or it hurts to  swallow.  Your symptoms do not improve with treatment.  Your symptoms last for more than 2 days.  You have a fever.  You vomit. Get help right away if:  You have pain in your arms, neck, jaw, teeth, or back.  You feel sweaty, dizzy, or light-headed.  You faint.  You have chest pain or shortness of breath.  You cannot stop vomiting, or you vomit blood.  Your stool is bloody or black.  You have severe pain in your abdomen. These symptoms may represent a serious problem that is an emergency. Do not wait to see if the symptoms will go away. Get medical help right away. Call your local emergency services (911 in the [place] Do not drive yourself to the hospital. Summary  Indigestion is a feeling of pain, discomfort, burning, or fullness in the upper part of your abdomen. It tends to occur while you are eating or right after you  have finished eating.  Follow an eating plan and other lifestyle changes as told by your health care provider.  Take over-the-counter and prescription medicines only as told by your health care provider. Do not take aspirin, ibuprofen, or other NSAIDs unless your health care provider told you to do so.  Contact your health care provider if your symptoms do not get better or they get worse. Some symptoms may represent a serious problem that is an emergency. Do not wait to see if the symptoms will go away. Get medical help right away. This information is not intended to replace advice given to you by your health care provider. Make sure you discuss any questions you have with your health care provider. Document Revised: 03/16/2018 Document Reviewed: 03/16/2018 Elsevier Patient Education  [place].

## 2020-09-27 NOTE — Progress Notes (Signed)
Chief Complaint  Patient presents with  . Follow-up   F/u  1. HTN improved and ST improved on lopressor 12.5 mg bid Will CC Dr. Saunders Revel to let him know improved and pt will f/u prn   2. B/l knee pain with ortho appt pending 10/04/20  3. C/o wheezing at times with h/o allergies no h/o asthma no current wheezing today 08/2019 CT lung scarring otherwise negative   Review of Systems  Constitutional: Negative for weight loss.  HENT: Negative for hearing loss.   Eyes: Negative for blurred vision.  Respiratory: Positive for wheezing. Negative for shortness of breath.   Cardiovascular: Negative for chest pain.  Gastrointestinal: Negative for abdominal pain.  Musculoskeletal: Positive for joint pain. Negative for falls.  Skin: Negative for rash.  Neurological: Negative for headaches.  Psychiatric/Behavioral: Negative for depression and memory loss.   Past Medical History:  Diagnosis Date  . Anxiety   . Basal cell carcinoma 03/2019   right upper forehead, MOHS  . Basal cell carcinoma 10/2012   Left upper back  . Fatty liver 2010  . GERD (gastroesophageal reflux disease)   . H. pylori infection   . Hemangioma 2010  . Hiatal hernia   . Hyperlipidemia   . Liver hemangioma    Past Surgical History:  Procedure Laterality Date  . bilateral lens replacement    . CHOLECYSTECTOMY    . KNEE SURGERY  06/29/2007   Meniscus tear, left knee surgery   Family History  Problem Relation Age of Onset  . Hypertension Sister   . Skin cancer Sister   . Hypertension Brother   . Hypertension Sister   . Heart attack Sister        1 y.o as of 09/10/19   . Anxiety disorder Sister   . Colon cancer Neg Hx    Social History   Socioeconomic History  . Marital status: Widowed    Spouse name: Not on file  . Number of children: 2  . Years of education: Not on file  . Highest education level: Not on file  Occupational History  . Occupation: Retired Garment/textile technologist  Tobacco Use  .  Smoking status: Never Smoker  . Smokeless tobacco: Never Used  Vaping Use  . Vaping Use: Never used  Substance and Sexual Activity  . Alcohol use: No  . Drug use: No  . Sexual activity: Not Currently    Birth control/protection: None  Other Topics Concern  . Not on file  Social History Narrative   Has living will.  Widow as of 04/08/18.  Daughters have a copy of living will and [name] would be HPOA if husband was not able.   1 daughter lives in [place] has 4 kids and 1 daughter in [place] with 2 kids      Would desire CPR.  She would not want heroic measures.            Social Determinants of Health   Financial Resource Strain: Low Risk   . Difficulty of Paying Living Expenses: Not hard at all  Food Insecurity: No Food Insecurity  . Worried About Charity fundraiser in the Last Year: Never true  . Ran Out of Food in the Last Year: Never true  Transportation Needs: No Transportation Needs  . Lack of Transportation (Medical): No  . Lack of Transportation (Non-Medical): No  Physical Activity:   . Days of Exercise per Week: Not on file  . Minutes of Exercise per Session:  Not on file  Stress: No Stress Concern Present  . Feeling of Stress : Not at all  Social Connections: Unknown  . Frequency of Communication with Friends and Family: More than three times a week  . Frequency of Social Gatherings with Friends and Family: More than three times a week  . Attends Religious Services: Not on file  . Active Member of Clubs or Organizations: Not on file  . Attends Archivist Meetings: Not on file  . Marital Status: Widowed  Intimate Partner Violence: Not At Risk  . Fear of Current or Ex-Partner: No  . Emotionally Abused: No  . Physically Abused: No  . Sexually Abused: No   Current Meds  Medication Sig  . ALPRAZolam (XANAX) 0.25 MG tablet Take 0.5 mg by mouth 2 (two) times daily as needed.   . Ascorbic Acid (VITAMIN C) 500 MG tablet Take 500 mg by mouth  daily.    Marland Kitchen aspirin EC 81 MG tablet Take 81 mg by mouth daily.  Marland Kitchen azelastine (ASTELIN) 0.1 % nasal spray Place 2 sprays into both nostrils as needed. Use in each nostril as directed   . Calcium Carbonate-Vitamin D (CALCIUM 600/VITAMIN D PO) Take 1 tablet by mouth 2 (two) times daily.  . Calcium Carbonate-Vitamin D 600-200 MG-UNIT CAPS Take by mouth daily.   . clobetasol cream (TEMOVATE) 0.05 % Spot treat areas of poison ivy twice daily until improved. Avoid face, groin, underarms.  . ezetimibe (ZETIA) 10 MG tablet TAKE 1 TABLET(10 MG) BY MOUTH DAILY  . Fluocinolone Acetonide 0.01 % OIL INT 2 TO 4 GTS IN AU QHS FOR 4 DAYS THEN PRN DRY SKIN AND IRRITATION  . fluticasone (FLONASE) 50 MCG/ACT nasal spray Place into both nostrils daily.  Marland Kitchen loratadine (CLARITIN) 10 MG tablet Take 10 mg by mouth daily as needed for allergies.   . metoprolol tartrate (LOPRESSOR) 25 MG tablet Take 0.5 tablets (12.5 mg total) by mouth 2 (two) times daily. Please cut in 1/2 for patient  . NONFORMULARY OR COMPOUNDED ITEM Achilles Tendonitis Cream-[facility] 2 refills  . pantoprazole (PROTONIX) 40 MG tablet Take 1 tablet (40 mg total) by mouth daily. 30 min before food  . simvastatin (ZOCOR) 40 MG tablet Take 1 tablet (40 mg total) by mouth daily.  Marland Kitchen triamcinolone cream (KENALOG) 0.1 % Apply 1 application topically 2 (two) times daily.  . vitamin E 400 UNIT capsule Take 400 Units by mouth daily.   . [DISCONTINUED] ezetimibe (ZETIA) 10 MG tablet TAKE 1 TABLET(10 MG) BY MOUTH DAILY  . [DISCONTINUED] simvastatin (ZOCOR) 40 MG tablet Take 1 tablet (40 mg total) by mouth daily.   Allergies  Allergen Reactions  . Dexilant [Dexlansoprazole] Diarrhea  . Sulfonamide Derivatives     REACTION: as child   Recent Results (from the past 2160 hour(s))  VITAMIN D 25 Hydroxy (Vit-D Deficiency, Fractures)     Status: None   Collection Time: 09/08/20  8:34 AM  Result Value Ref Range   VITD 38.54 30.00 - 100.00 ng/mL   Hemoglobin A1c     Status: None   Collection Time: 09/08/20  8:34 AM  Result Value Ref Range   Hgb A1c MFr Bld 5.4 4.6 - 6.5 %    Comment: Glycemic Control Guidelines for People with Diabetes:Non Diabetic:  <6%Goal of Therapy: <7%Additional Action Suggested:  >8%   Urinalysis, Routine w reflex microscopic     Status: Abnormal   Collection Time: 09/08/20  8:34 AM  Result Value Ref Range  Color, Urine DARK YELLOW YELLOW   APPearance CLOUDY (A) CLEAR   Specific Gravity, Urine 1.019 1.001 - 1.03   pH < OR = 5.0 5.0 - 8.0   Glucose, UA NEGATIVE NEGATIVE   Bilirubin Urine NEGATIVE NEGATIVE    Comment: Verified by repeat analysis. Marland Kitchen    Ketones, ur TRACE (A) NEGATIVE   Hgb urine dipstick NEGATIVE NEGATIVE   Protein, ur TRACE (A) NEGATIVE   Nitrite NEGATIVE NEGATIVE   Leukocytes,Ua 2+ (A) NEGATIVE   WBC, UA 6-10 (A) 0 - 5 /HPF   RBC / HPF 0-2 0 - 2 /HPF   Squamous Epithelial / LPF 6-10 (A) < OR = 5 /HPF   Bacteria, UA MODERATE (A) NONE SEEN /HPF   Hyaline Cast 0-1 (A) NONE SEEN /LPF  TSH     Status: None   Collection Time: 09/08/20  8:34 AM  Result Value Ref Range   TSH 4.31 0.35 - 4.50 uIU/mL  CBC with Differential/Platelet     Status: None   Collection Time: 09/08/20  8:34 AM  Result Value Ref Range   WBC 5.1 4.0 - 10.5 K/uL   RBC 4.32 3.87 - 5.11 Mil/uL   Hemoglobin 13.7 12.0 - 15.0 g/dL   HCT 39.3 36 - 46 %   MCV 90.9 78.0 - 100.0 fl   MCHC 34.9 30.0 - 36.0 g/dL   RDW 12.3 11.5 - 15.5 %   Platelets 172.0 150 - 400 K/uL   Neutrophils Relative % 57.5 43 - 77 %   Lymphocytes Relative 31.1 12 - 46 %   Monocytes Relative 8.3 3 - 12 %   Eosinophils Relative 2.8 0 - 5 %   Basophils Relative 0.3 0 - 3 %   Neutro Abs 2.9 1.4 - 7.7 K/uL   Lymphs Abs 1.6 0.7 - 4.0 K/uL   Monocytes Absolute 0.4 0.1 - 1.0 K/uL   Eosinophils Absolute 0.1 0.0 - 0.7 K/uL   Basophils Absolute 0.0 0.0 - 0.1 K/uL  Lipid panel     Status: None   Collection Time: 09/08/20  8:34 AM  Result Value Ref  Range   Cholesterol 141 0 - 200 mg/dL    Comment: ATP III Classification       Desirable:  < 200 mg/dL               Borderline High:  200 - 239 mg/dL          High:  > = 240 mg/dL   Triglycerides 100.0 0 - 149 mg/dL    Comment: Normal:  <150 mg/dLBorderline High:  150 - 199 mg/dL   HDL 53.50 >39.00 mg/dL   VLDL 20.0 0.0 - 40.0 mg/dL   LDL Cholesterol 67 0 - 99 mg/dL   Total CHOL/HDL Ratio 3     Comment:                Men          Women1/2 Average Risk     3.4          3.3Average Risk          5.0          4.42X Average Risk          9.6          7.13X Average Risk          15.0          11.0  NonHDL 87.04     Comment: NOTE:  Non-HDL goal should be 30 mg/dL higher than patient's LDL goal (i.e. LDL goal of < 70 mg/dL, would have non-HDL goal of < 100 mg/dL)  Comprehensive metabolic panel     Status: Abnormal   Collection Time: 09/08/20  8:34 AM  Result Value Ref Range   Sodium 140 135 - 145 mEq/L   Potassium 4.2 3.5 - 5.1 mEq/L   Chloride 106 96 - 112 mEq/L   CO2 25 19 - 32 mEq/L   Glucose, Bld 88 70 - 99 mg/dL   BUN 13 6 - 23 mg/dL   Creatinine, Ser 0.81 0.40 - 1.20 mg/dL   Total Bilirubin 1.6 (H) 0.2 - 1.2 mg/dL   Alkaline Phosphatase 61 39 - 117 U/L   AST 14 0 - 37 U/L   ALT 11 0 - 35 U/L   Total Protein 6.8 6.0 - 8.3 g/dL   Albumin 4.0 3.5 - 5.2 g/dL   GFR 70.73 >60.00 mL/min    Comment: Calculated using the CKD-EPI Creatinine Equation (2021)   Calcium 9.0 8.4 - 10.5 mg/dL  Urine Culture     Status: None   Collection Time: 09/12/20  9:58 AM   Specimen: Urine  Result Value Ref Range   MICRO NUMBER: [id number]    SPECIMEN QUALITY: Adequate    Sample Source NOT GIVEN    STATUS: FINAL    Result: No Growth    Objective  Body mass index is 33.9 kg/m. Wt Readings from Last 3 Encounters:  09/27/20 179 lb 6.4 oz (81.4 kg)  08/08/20 188 lb (85.3 kg)  08/04/20 180 lb 6.4 oz (81.8 kg)   Temp Readings from Last 3 Encounters:  09/27/20 (!) 97.5 F (36.4 C)  (Oral)  08/04/20 98.3 F (36.8 C) (Oral)  07/05/20 98.5 F (36.9 C) (Oral)   BP Readings from Last 3 Encounters:  09/27/20 124/76  08/08/20 138/74  08/04/20 128/76   Pulse Readings from Last 3 Encounters:  09/27/20 74  08/08/20 (!) 57  08/04/20 (!) 107    Physical Exam Vitals and nursing note reviewed.  Constitutional:      Appearance: Normal appearance. She is well-developed and well-groomed. She is obese.  HENT:     Head: Normocephalic and atraumatic.  Eyes:     Conjunctiva/sclera: Conjunctivae normal.     Pupils: Pupils are equal, round, and reactive to light.  Cardiovascular:     Rate and Rhythm: Normal rate and regular rhythm.     Heart sounds: Normal heart sounds. No murmur heard.   Pulmonary:     Effort: Pulmonary effort is normal.     Breath sounds: Normal breath sounds.  Skin:    General: Skin is warm and dry.  Neurological:     General: No focal deficit present.     Mental Status: She is alert and oriented to person, place, and time. Mental status is at baseline.     Gait: Gait normal.  Psychiatric:        Attention and Perception: Attention and perception normal.        Mood and Affect: Mood and affect normal.        Speech: Speech normal.        Behavior: Behavior normal. Behavior is cooperative.        Thought Content: Thought content normal.        Cognition and Memory: Cognition and memory normal.        Judgment: Judgment normal.  Assessment  Plan  Sinus tachycardia and elevated BP improved Lopressor 12.5 mg bid  CC Dr. Saunders Revel f/u cards prn   Mild intermittent reactive airway disease without complication Reviewed CT scan 08/2019 lung scarring atelectasis  Consider prn Albuterol inhaler   Hyperlipidemia, unspecified hyperlipidemia type - Plan: ezetimibe (ZETIA) 10 MG tablet zocor 40 mg qhs   Chronic pain of both knees  Ortho appt 10/04/20   HM Flu shot utd9/24/21 tdapgiven Rx prev shingrix utd prevnar utd pna 23 utd utd covid  2/2pfizer consider booster  mammo normal 07/06/19 pfw sent ROI today 09/27/20 with copy of notes  dexa 07/01/2019 osteopenia  Colonoscopy normal 07/25/11 Dr. Deatra Ina f/u in 10 years Get copy of cologuardPFW Dr. Matthew Saras per pt Out of age window pap  Of note sister patsy lough also a pt   Dr. Pryor Ochoa ENT  PFW ob/gyn 07/01/2019 My eye doctor  Provider: Dr. [name] McLean-Scocuzza-Internal Medicine

## 2020-10-04 DIAGNOSIS — M170 Bilateral primary osteoarthritis of knee: Secondary | ICD-10-CM | POA: Diagnosis not present

## 2020-10-09 ENCOUNTER — Encounter: Payer: Self-pay | Admitting: Internal Medicine

## 2020-10-15 IMAGING — CT CT HEAD W/O CM
3 series · 15 of 46 positions shown, 18 images · non-contrast
Comparison: None.

CLINICAL DATA: Altered level of consciousness (LOC), unexplained.
Syncope today.

EXAM:
CT HEAD WITHOUT CONTRAST
TECHNIQUE: Contiguous axial images were obtained from the base of the skull
through the vertex without intravenous contrast.

[Series 2: head wo · axial · 0.43mm/px · z∈[+822,+942]mm · 9 of 29 slices shown, 12 images]
[im 3/29  brain]
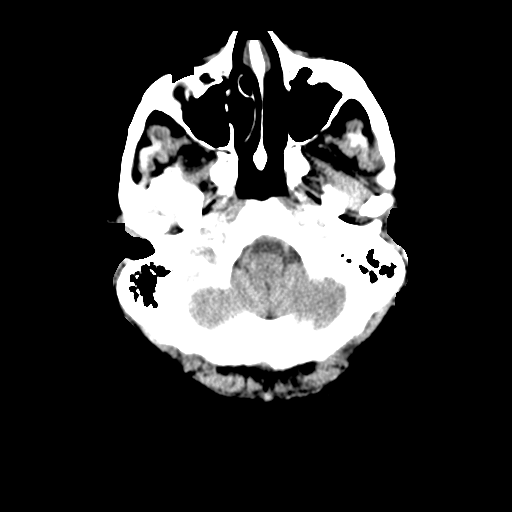
[im 3/29  bone]
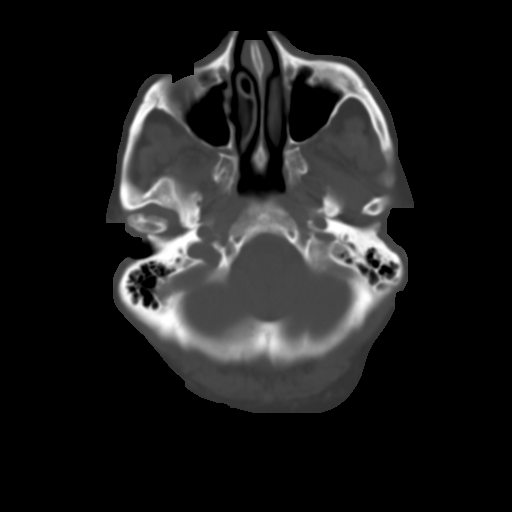
[im 6/29  brain]
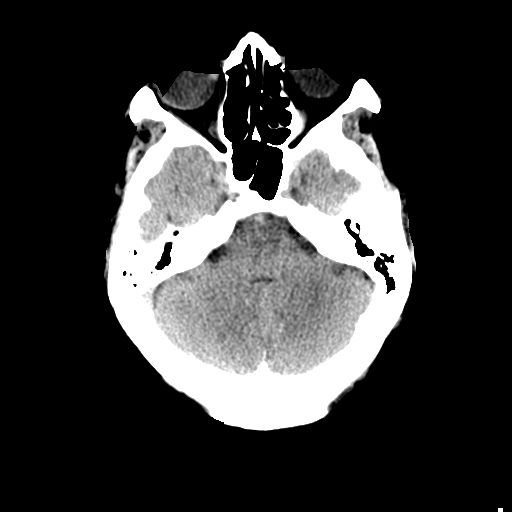
[im 9/29  brain]
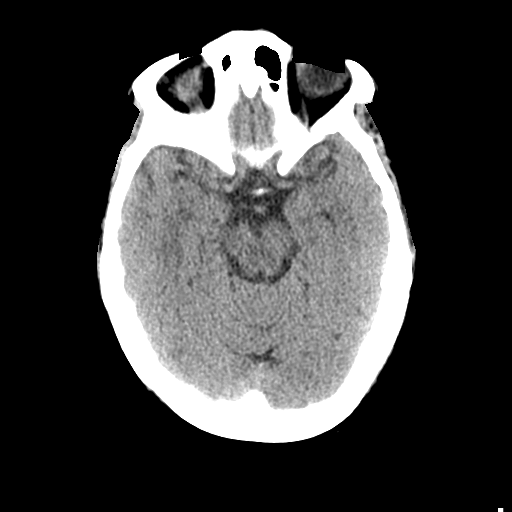
[im 12/29  brain]
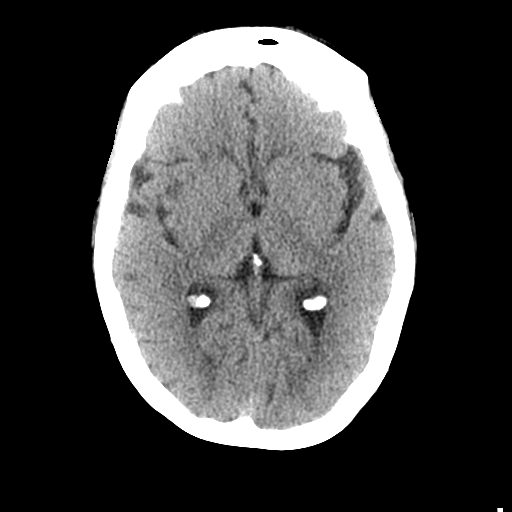
[im 15/29  brain]
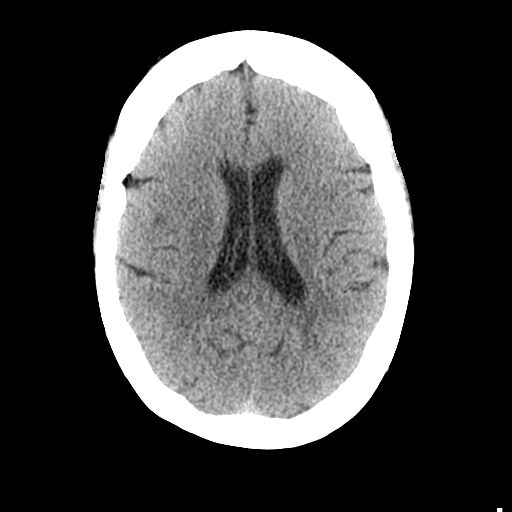
[im 15/29  bone]
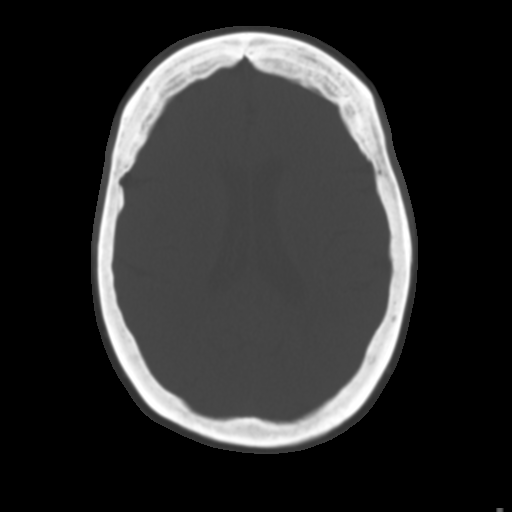
[im 18/29  brain]
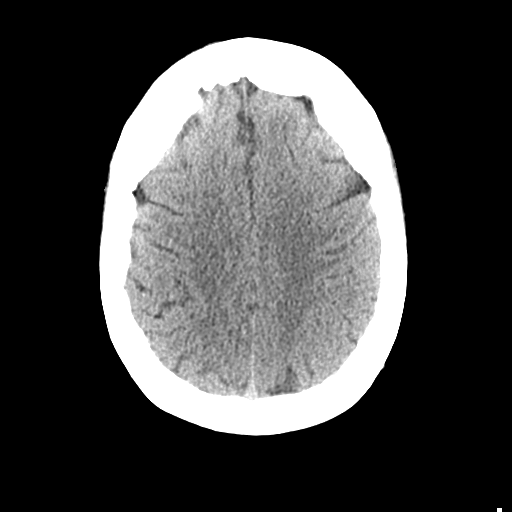
[im 21/29  brain]
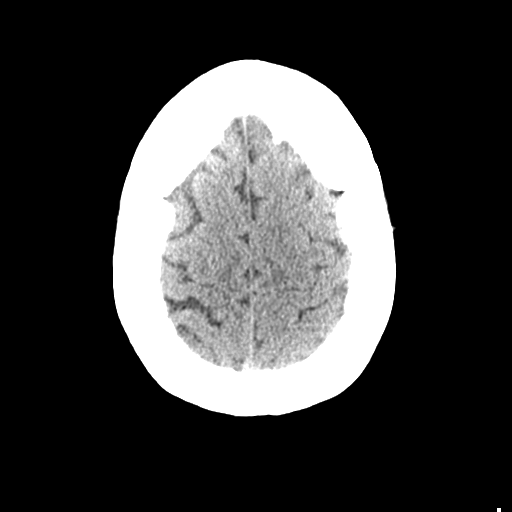
[im 24/29  brain]
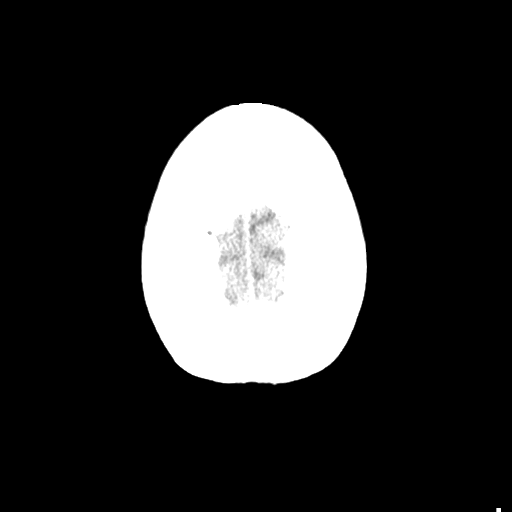
[im 27/29  brain]
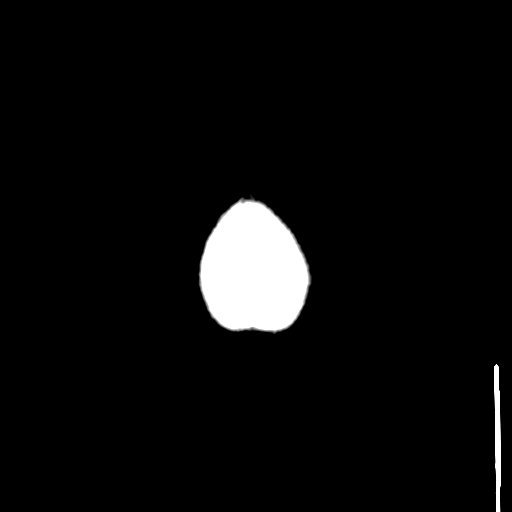
[im 27/29  bone]
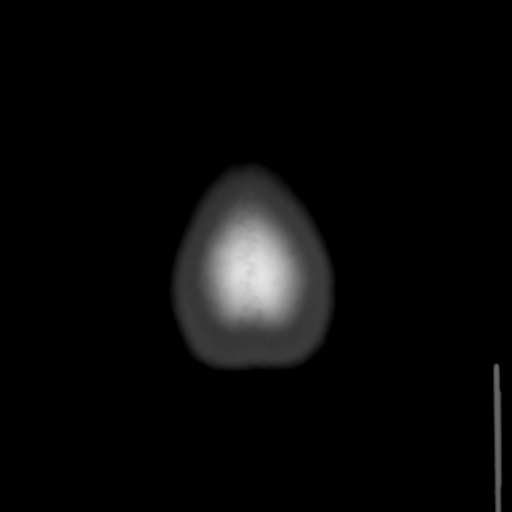

[Series 4: coronal soft tissue · coronal · 0.28mm/px · 3 of 63 slices shown]
[im 21/63  brain]
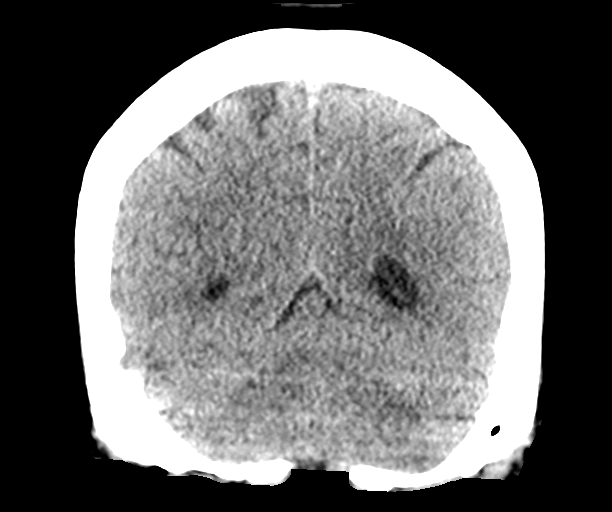
[im 28/63  brain]
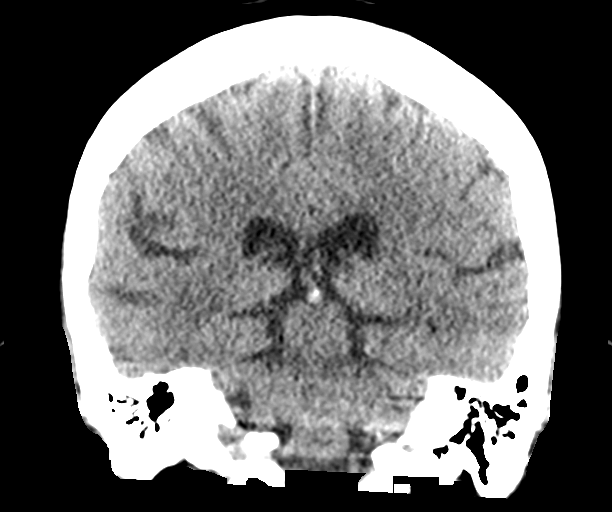
[im 35/63  brain]
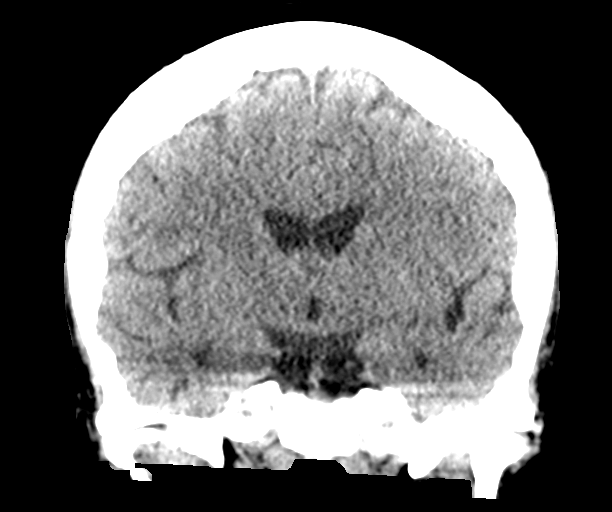

[Series 5: sagittal soft tissue · sagittal · 0.29mm/px · 3 of 50 slices shown]
[im 17/50  brain]
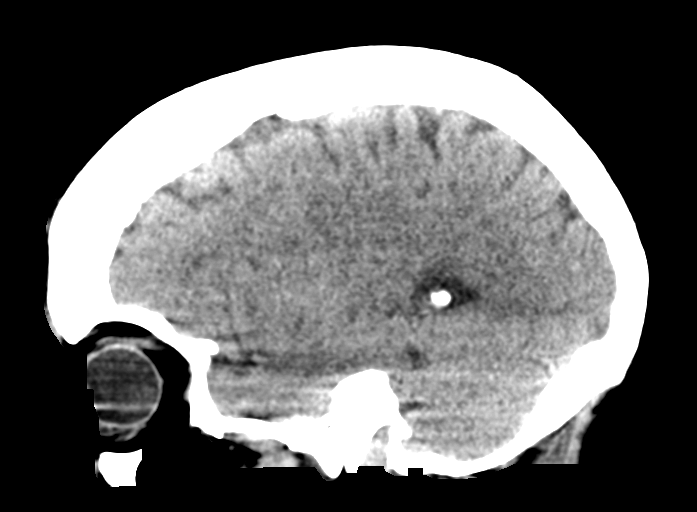
[im 25/50  brain]
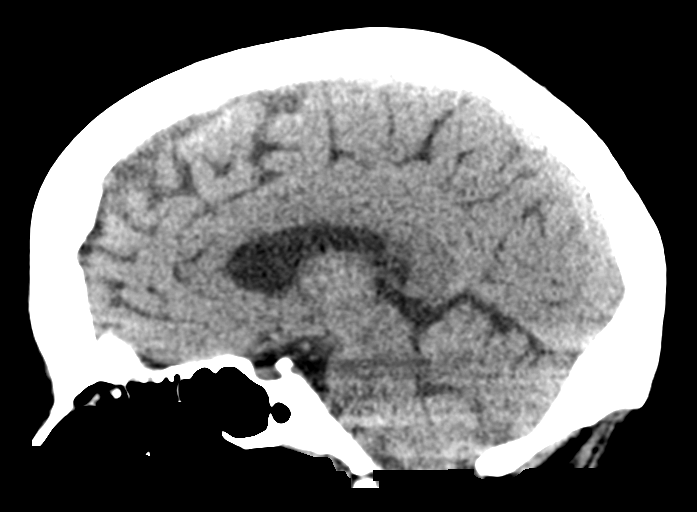
[im 33/50  brain]
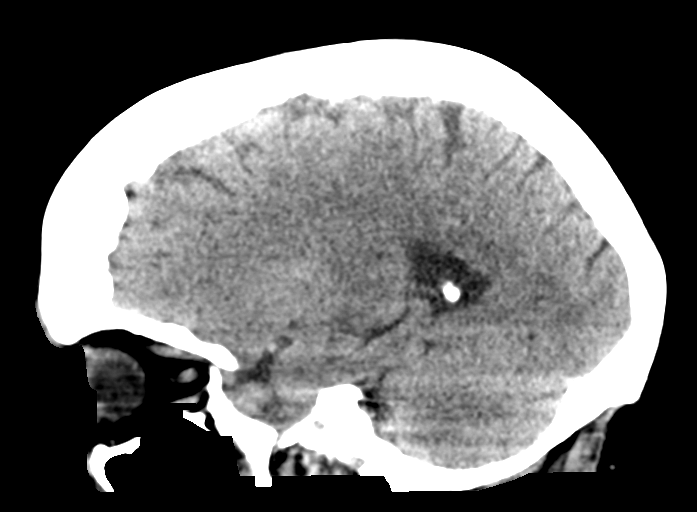

[15 of 46 positions shown; findings below may reference images not displayed]

FINDINGS: Brain: No intracranial hemorrhage, mass effect, or midline shift. No
hydrocephalus. The basilar cisterns are patent. No evidence of
territorial infarct or acute ischemia. No extra-axial or
intracranial fluid collection.

Vascular: Atherosclerosis of skullbase vasculature without
hyperdense vessel or abnormal calcification.

Skull: No fracture or focal lesion.

Sinuses/Orbits: Paranasal sinuses and mastoid air cells are clear.
The visualized orbits are unremarkable. Bilateral cataract
resection.

Other: None.
IMPRESSION: Unremarkable head CT.

## 2020-11-07 ENCOUNTER — Other Ambulatory Visit: Payer: Self-pay

## 2020-11-07 ENCOUNTER — Telehealth: Payer: Self-pay | Admitting: Internal Medicine

## 2020-11-07 ENCOUNTER — Telehealth (INDEPENDENT_AMBULATORY_CARE_PROVIDER_SITE_OTHER): Payer: Medicare PPO | Admitting: Internal Medicine

## 2020-11-07 ENCOUNTER — Encounter: Payer: Self-pay | Admitting: Internal Medicine

## 2020-11-07 VITALS — BP 140/73 | HR 62 | Temp 97.1°F | Ht 61.0 in | Wt 184.0 lb

## 2020-11-07 DIAGNOSIS — M7051 Other bursitis of knee, right knee: Secondary | ICD-10-CM | POA: Diagnosis not present

## 2020-11-07 DIAGNOSIS — E785 Hyperlipidemia, unspecified: Secondary | ICD-10-CM | POA: Diagnosis not present

## 2020-11-07 DIAGNOSIS — Z1389 Encounter for screening for other disorder: Secondary | ICD-10-CM

## 2020-11-07 DIAGNOSIS — Z1329 Encounter for screening for other suspected endocrine disorder: Secondary | ICD-10-CM

## 2020-11-07 DIAGNOSIS — R000 Tachycardia, unspecified: Secondary | ICD-10-CM | POA: Diagnosis not present

## 2020-11-07 DIAGNOSIS — E559 Vitamin D deficiency, unspecified: Secondary | ICD-10-CM | POA: Diagnosis not present

## 2020-11-07 DIAGNOSIS — M1710 Unilateral primary osteoarthritis, unspecified knee: Secondary | ICD-10-CM

## 2020-11-07 DIAGNOSIS — R030 Elevated blood-pressure reading, without diagnosis of hypertension: Secondary | ICD-10-CM | POA: Diagnosis not present

## 2020-11-07 NOTE — Progress Notes (Addendum)
Telephone Note  I connected with Leah Melendez on 11/07/20 at  9:30 AM EST by a telephone and verified that I am speaking with the correct person using two identifiers.  Location patient: home, [place] Location provider:work or home office Persons participating in the virtual visit: patient, provider  I discussed the limitations of evaluation and management by telemedicine and the availability of in person appointments. The patient expressed understanding and agreed to proceed.   HPI: 1. GERD disc try otc pepcid and nexium  2. After Xmas felt tired with chills x 2 days and better utd flu and covid 3/3 shots daughters MIL + covid daughter neg flu and covid and grandson +covid w/o sx's after holidays but she was around them feeling better now  3. ST controlled on metoprolol 12.5 mg bid  ROS: See pertinent positives and negatives per HPI.  Past Medical History:  Diagnosis Date  . Anxiety   . Basal cell carcinoma 03/2019   right upper forehead, MOHS  . Basal cell carcinoma 10/2012   Left upper back  . Fatty liver 2010  . GERD (gastroesophageal reflux disease)   . H. pylori infection   . Hemangioma 2010  . Hiatal hernia   . Hyperlipidemia   . Liver hemangioma     Past Surgical History:  Procedure Laterality Date  . bilateral lens replacement    . CHOLECYSTECTOMY    . KNEE SURGERY  06/29/2007   Meniscus tear, left knee surgery     Current Outpatient Medications:  .  Ascorbic Acid (VITAMIN C) 500 MG tablet, Take 500 mg by mouth daily., Disp: , Rfl:  .  aspirin EC 81 MG tablet, Take 81 mg by mouth daily., Disp: , Rfl:  .  azelastine (ASTELIN) 0.1 % nasal spray, Place 2 sprays into both nostrils as needed. Use in each nostril as directed, Disp: , Rfl:  .  Calcium Carbonate-Vitamin D (CALCIUM 600/VITAMIN D PO), Take 1 tablet by mouth 2 (two) times daily., Disp: , Rfl:  .  Diclofenac Sodium (VOLTAREN EX), Apply topically as needed., Disp: , Rfl:  .  ezetimibe (ZETIA) 10 MG tablet,  TAKE 1 TABLET(10 MG) BY MOUTH DAILY, Disp: 90 tablet, Rfl: 3 .  fish oil-omega-3 fatty acids 1000 MG capsule, Take 2 g by mouth daily., Disp: , Rfl:  .  fluticasone (FLONASE) 50 MCG/ACT nasal spray, Place into both nostrils daily., Disp: , Rfl:  .  loratadine (CLARITIN) 10 MG tablet, Take 10 mg by mouth daily as needed for allergies., Disp: , Rfl:  .  melatonin 5 MG TABS, Take 5 mg by mouth at bedtime as needed., Disp: , Rfl:  .  metoprolol tartrate (LOPRESSOR) 25 MG tablet, Take 0.5 tablets (12.5 mg total) by mouth 2 (two) times daily. Please cut in 1/2 for patient, Disp: 90 tablet, Rfl: 3 .  pantoprazole (PROTONIX) 40 MG tablet, Take 1 tablet (40 mg total) by mouth daily. 30 min before food, Disp: 90 tablet, Rfl: 3 .  simvastatin (ZOCOR) 40 MG tablet, Take 1 tablet (40 mg total) by mouth daily., Disp: 90 tablet, Rfl: 3 .  vitamin E 400 UNIT capsule, Take 400 Units by mouth daily. , Disp: , Rfl:  .  ALPRAZolam (XANAX) 0.25 MG tablet, Take 0.5 mg by mouth 2 (two) times daily as needed.  (Patient not taking: Reported on 11/07/2020), Disp: , Rfl:   EXAM:  VITALS per patient if applicable:  GENERAL: alert, oriented, appears well and in no acute distress  PSYCH/NEURO: pleasant  and cooperative, no obvious depression or anxiety, speech and thought processing grossly intact  ASSESSMENT AND PLAN:  Discussed the following assessment and plan:  Sinus tachycardia - Plan: Comprehensive metabolic panel, Lipid panel, CBC w/Diff Controlled with controlled BP on lopressor 12.5 mg bid  Fasting labs 09/08/21  Hyperlipidemia, unspecified hyperlipidemia type - Plan: Comprehensive metabolic panel, Lipid panel, CBC w/Diff  Vitamin D deficiency - Plan: Vitamin D (25 hydroxy)  Pes anserinus bursitis of right knee Arthritis of knee F/u [facility]o prn  HM Flu shot utd9/24/21 tdapgiven Rxprev shingrix utd prevnar utd pna 23 utd utd covid 3/3 pfizer   mammo normal 07/06/19 pfw sent ROI today 09/27/20  with copy of notes mammo 08/09/20 negative   dexa 07/01/2019 osteopenia  Colonoscopy normal 07/25/11 Dr. Deatra Ina f/u in 10 years 08/19/18 cologuardnegative  Armbruster, Carlota Raspberry, MD  McLean-Scocuzza, Nino Glow, MD Generally for those patient who are > age 79 and have no history of polyps / asymptomatic / no family history of colon cancer, we stop all colon cancer screening including stool testing.   If the patient is not comfortable with that and wants to have something done you could consider a stool test, however she needs to know if it is positive it will lead to colonoscopy, and if she never wants another colonoscopy she may not want the stool test. I personally use FIT more than Cologuard, as Cologuard is much more expensive. Just my take, hope that is helpful.   Eppie Gibson of age window pap  Of note sister patsy lough also a pt   Dr. Pryor Ochoa ENT  [facility] ob/gyn 07/01/2019 My eye doctor  -we discussed possible serious and likely etiologies, options for evaluation and workup, limitations of telemedicine visit vs in person visit, treatment, treatment risks and precautions.    I discussed the assessment and treatment plan with the patient. The patient was provided an opportunity to ask questions and all were answered. The patient agreed with the plan and demonstrated an understanding of the instructions.    Time spent 10 min Delorise Jackson, MD

## 2020-11-07 NOTE — Patient Instructions (Addendum)
Pepcid (1st daily or up to 2x per day) over the counter OR Nexium 20 mg daily x 14 days  30 minutes before food   Gastroesophageal Reflux Disease, Adult Gastroesophageal reflux (GER) happens when acid from the stomach flows up into the tube that connects the mouth and the stomach (esophagus). Normally, food travels down the esophagus and stays in the stomach to be digested. However, when a person has GER, food and stomach acid sometimes move back up into the esophagus. If this becomes a more serious problem, the person may be diagnosed with a disease called gastroesophageal reflux disease (GERD). GERD occurs when the reflux:  Happens often.  Causes frequent or severe symptoms.  Causes problems such as damage to the esophagus. When stomach acid comes in contact with the esophagus, the acid may cause inflammation in the esophagus. Over time, GERD may create small holes (ulcers) in the lining of the esophagus. What are the causes? This condition is caused by a problem with the muscle between the esophagus and the stomach (lower esophageal sphincter, or LES). Normally, the LES muscle closes after food passes through the esophagus to the stomach. When the LES is weakened or abnormal, it does not close properly, and that allows food and stomach acid to go back up into the esophagus. The LES can be weakened by certain dietary substances, medicines, and medical conditions, including:  Tobacco use.  Pregnancy.  Having a hiatal hernia.  Alcohol use.  Certain foods and beverages, such as coffee, chocolate, onions, and peppermint. What increases the risk? You are more likely to develop this condition if you:  Have an increased body weight.  Have a connective tissue disorder.  Take NSAIDs, such as ibuprofen. What are the signs or symptoms? Symptoms of this condition include:  Heartburn.  Difficult or painful swallowing and the feeling of having a lump in the throat.  A bitter taste in the  mouth.  Bad breath and having a large amount of saliva.  Having an upset or bloated stomach and belching.  Chest pain. Different conditions can cause chest pain. Make sure you see your health care provider if you experience chest pain.  Shortness of breath or wheezing.  Ongoing (chronic) cough or a nighttime cough.  Wearing away of tooth enamel.  Weight loss. How is this diagnosed? This condition may be diagnosed based on a medical history and a physical exam. To determine if you have mild or severe GERD, your health care provider may also monitor how you respond to treatment. You may also have tests, including:  A test to examine your stomach and esophagus with a small camera (endoscopy).  A test that measures the acidity level in your esophagus.  A test that measures how much pressure is on your esophagus.  A barium swallow or modified barium swallow test to show the shape, size, and functioning of your esophagus. How is this treated? Treatment for this condition may vary depending on how severe your symptoms are. Your health care provider may recommend:  Changes to your diet.  Medicine.  Surgery. The goal of treatment is to help relieve your symptoms and to prevent complications. Follow these instructions at home: Eating and drinking  Follow a diet as recommended by your health care provider. This may involve avoiding foods and drinks such as: ? Coffee and tea, with or without caffeine. ? Drinks that contain alcohol. ? Energy drinks and sports drinks. ? Carbonated drinks or sodas. ? Chocolate and cocoa. ? Peppermint and  mint flavorings. ? Garlic and onions. ? Horseradish. ? Spicy and acidic foods, including peppers, chili powder, curry powder, vinegar, hot sauces, and barbecue sauce. ? Citrus fruit juices and citrus fruits, such as oranges, lemons, and limes. ? Tomato-based foods, such as red sauce, chili, salsa, and pizza with red sauce. ? Fried and fatty foods,  such as donuts, french fries, potato chips, and high-fat dressings. ? High-fat meats, such as hot dogs and fatty cuts of red and white meats, such as rib eye steak, sausage, ham, and bacon. ? High-fat dairy items, such as whole milk, butter, and cream cheese.  Eat small, frequent meals instead of large meals.  Avoid drinking large amounts of liquid with your meals.  Avoid eating meals during the 2-3 hours before bedtime.  Avoid lying down right after you eat.  Do not exercise right after you eat.   Lifestyle  Do not use any products that contain nicotine or tobacco. These products include cigarettes, chewing tobacco, and vaping devices, such as e-cigarettes. If you need help quitting, ask your health care provider.  Try to reduce your stress by using methods such as yoga or meditation. If you need help reducing stress, ask your health care provider.  If you are overweight, reduce your weight to an amount that is healthy for you. Ask your health care provider for guidance about a safe weight loss goal.   General instructions  Pay attention to any changes in your symptoms.  Take over-the-counter and prescription medicines only as told by your health care provider. Do not take aspirin, ibuprofen, or other NSAIDs unless your health care provider told you to take these medicines.  Wear loose-fitting clothing. Do not wear anything tight around your waist that causes pressure on your abdomen.  Raise (elevate) the head of your bed about 6 inches (15 cm). You can use a wedge to do this.  Avoid bending over if this makes your symptoms worse.  Keep all follow-up visits. This is important. Contact a health care provider if:  You have: ? New symptoms. ? Unexplained weight loss. ? Difficulty swallowing or it hurts to swallow. ? Wheezing or a persistent cough. ? A hoarse voice.  Your symptoms do not improve with treatment. Get help right away if:  You have sudden pain in your arms,  neck, jaw, teeth, or back.  You suddenly feel sweaty, dizzy, or light-headed.  You have chest pain or shortness of breath.  You vomit and the vomit is green, yellow, or black, or it looks like blood or coffee grounds.  You faint.  You have stool that is red, bloody, or black.  You cannot swallow, drink, or eat. These symptoms may represent a serious problem that is an emergency. Do not wait to see if the symptoms will go away. Get medical help right away. Call your local emergency services (911 in the [place] Do not drive yourself to the hospital. Summary  Gastroesophageal reflux happens when acid from the stomach flows up into the esophagus. GERD is a disease in which the reflux happens often, causes frequent or severe symptoms, or causes problems such as damage to the esophagus.  Treatment for this condition may vary depending on how severe your symptoms are. Your health care provider may recommend diet and lifestyle changes, medicine, or surgery.  Contact a health care provider if you have new or worsening symptoms.  Take over-the-counter and prescription medicines only as told by your health care provider. Do not take aspirin, ibuprofen,  or other NSAIDs unless your health care provider told you to do so.  Keep all follow-up visits as told by your health care provider. This is important. This information is not intended to replace advice given to you by your health care provider. Make sure you discuss any questions you have with your health care provider. Document Revised: 04/24/2020 Document Reviewed: 04/24/2020 Elsevier Patient Education  2021 [place] for Gastroesophageal Reflux Disease, Adult When you have gastroesophageal reflux disease (GERD), the foods you eat and your eating habits are very important. Choosing the right foods can help ease the discomfort of GERD. Consider working with a dietitian to help you make healthy food choices. What are tips for  following this plan? Reading food labels  Look for foods that are low in saturated fat. Foods that have less than 5% of daily value (DV) of fat and 0 g of trans fats may help with your symptoms. Cooking  Cook foods using methods other than frying. This may include baking, steaming, grilling, or broiling. These are all methods that do not need a lot of fat for cooking.  To add flavor, try to use herbs that are low in spice and acidity. Meal planning  Choose healthy foods that are low in fat, such as fruits, vegetables, whole grains, low-fat dairy products, lean meats, fish, and poultry.  Eat frequent, small meals instead of three large meals each day. Eat your meals slowly, in a relaxed setting. Avoid bending over or lying down until 2-3 hours after eating.  Limit high-fat foods such as fatty meats or fried foods.  Limit your intake of fatty foods, such as oils, butter, and shortening.  Avoid the following as told by your health care provider: ? Foods that cause symptoms. These may be different for different people. Keep a food diary to keep track of foods that cause symptoms. ? Alcohol. ? Drinking large amounts of liquid with meals. ? Eating meals during the 2-3 hours before bed.   Lifestyle  Maintain a healthy weight. Ask your health care provider what weight is healthy for you. If you need to lose weight, work with your health care provider to do so safely.  Exercise for at least 30 minutes on 5 or more days each week, or as told by your health care provider.  Avoid wearing clothes that fit tightly around your waist and chest.  Do not use any products that contain nicotine or tobacco. These products include cigarettes, chewing tobacco, and vaping devices, such as e-cigarettes. If you need help quitting, ask your health care provider.  Sleep with the head of your bed raised. Use a wedge under the mattress or blocks under the bed frame to raise the head of the bed.  Chew  sugar-free gum after mealtimes. What foods should I eat? Eat a healthy, well-balanced diet of fruits, vegetables, whole grains, low-fat dairy products, lean meats, fish, and poultry. Each person is different. Foods that may trigger symptoms in one person may not trigger any symptoms in another person. Work with your health care provider to identify foods that are safe for you. The items listed above may not be a complete list of recommended foods and beverages. Contact a dietitian for more information.   What foods should I avoid? Limiting some of these foods may help manage the symptoms of GERD. Everyone is different. Consult a dietitian or your health care provider to help you identify the exact foods to avoid, if any. Fruits  Any fruits prepared with added fat. Any fruits that cause symptoms. For some people this may include citrus fruits, such as oranges, grapefruit, pineapple, and lemons. Vegetables Deep-fried vegetables. [place] fries. Any vegetables prepared with added fat. Any vegetables that cause symptoms. For some people, this may include tomatoes and tomato products, chili peppers, onions and garlic, and horseradish. Grains Pastries or quick breads with added fat. Meats and other proteins High-fat meats, such as fatty beef or pork, hot dogs, ribs, ham, sausage, salami, and bacon. Fried meat or protein, including fried fish and fried chicken. Nuts and nut butters, in large amounts. Dairy Whole milk and chocolate milk. Sour cream. Cream. Ice cream. Cream cheese. Milkshakes. Fats and oils Butter. Margarine. Shortening. Ghee. Beverages Coffee and tea, with or without caffeine. Carbonated beverages. Sodas. Energy drinks. Fruit juice made with acidic fruits, such as orange or grapefruit. Tomato juice. Alcoholic drinks. Sweets and desserts Chocolate and cocoa. Donuts. Seasonings and condiments Pepper. Peppermint and spearmint. Added salt. Any condiments, herbs, or seasonings that cause  symptoms. For some people, this may include curry, hot sauce, or vinegar-based salad dressings. The items listed above may not be a complete list of foods and beverages to avoid. Contact a dietitian for more information. Questions to ask your health care provider Diet and lifestyle changes are usually the first steps that are taken to manage symptoms of GERD. If diet and lifestyle changes do not improve your symptoms, talk with your health care provider about taking medicines. Where to find more information  [employer] for Gastrointestinal Disorders: aboutgerd.org Summary  When you have gastroesophageal reflux disease (GERD), food and lifestyle choices may be very helpful in easing the discomfort of GERD.  Eat frequent, small meals instead of three large meals each day. Eat your meals slowly, in a relaxed setting. Avoid bending over or lying down until 2-3 hours after eating.  Limit high-fat foods such as fatty meats or fried foods. This information is not intended to replace advice given to you by your health care provider. Make sure you discuss any questions you have with your health care provider. Document Revised: 04/24/2020 Document Reviewed: 04/24/2020 Elsevier Patient Education  [place].

## 2020-11-07 NOTE — Telephone Encounter (Signed)
Can you contact cologuard for records from date?   Thanks [employer]

## 2020-11-07 NOTE — Telephone Encounter (Signed)
Patient had a virtual with Dr. Aundra Dubin today. She told Dr Aundra Dubin the wrong doctor. Dr. Marjory Lies did her cologuard on 08-19-2018. See results in Epic.

## 2020-11-08 ENCOUNTER — Encounter: Payer: Self-pay | Admitting: Internal Medicine

## 2020-11-08 NOTE — Telephone Encounter (Signed)
Results printed and placed in your review folder.

## 2020-11-30 ENCOUNTER — Ambulatory Visit (INDEPENDENT_AMBULATORY_CARE_PROVIDER_SITE_OTHER): Payer: Medicare PPO

## 2020-11-30 VITALS — Ht 61.0 in | Wt 184.0 lb

## 2020-11-30 DIAGNOSIS — Z0000 Encounter for general adult medical examination without abnormal findings: Secondary | ICD-10-CM | POA: Diagnosis not present

## 2020-11-30 NOTE — Progress Notes (Signed)
Subjective:   AMRI LIEN is a 75 y.o. female who presents for Medicare Annual (Subsequent) preventive examination.  Review of Systems    No ROS.  Medicare Wellness Virtual Visit.    Cardiac Risk Factors include: advanced age (>73men, >69 women)     Objective:    Today's Vitals   11/30/20 1239  Weight: 184 lb (83.5 kg)  Height: 5\' 1"  (1.549 m)   Body mass index is 34.77 kg/m.  Advanced Directives 11/30/2020 11/30/2019 09/11/2019 07/01/2019 08/12/2018 07/29/2017 07/22/2016  Does Patient Have a Medical Advance Directive? Yes Yes No No Yes Yes Yes  Type of Paramedic of [place];Living will [place];Living will - - [place];Living will [place];Living will [place];Living will  Does patient want to make changes to medical advance directive? No - Patient declined No - Patient declined - - - - No - Patient declined  Copy of [place] in Chart? No - copy requested No - copy requested - - No - copy requested No - copy requested No - copy requested  Would patient like information on creating a medical advance directive? - - - No - Guardian declined - - -    Current Medications (verified) Outpatient Encounter Medications as of 11/30/2020  Medication Sig  . zinc gluconate 50 MG tablet Take 50 mg by mouth daily.  Marland Kitchen ALPRAZolam (XANAX) 0.25 MG tablet Take 0.5 mg by mouth 2 (two) times daily as needed.  (Patient not taking: Reported on 11/07/2020)  . Ascorbic Acid (VITAMIN C) 500 MG tablet Take 500 mg by mouth daily.  Marland Kitchen aspirin EC 81 MG tablet Take 81 mg by mouth daily.  Marland Kitchen azelastine (ASTELIN) 0.1 % nasal spray Place 2 sprays into both nostrils as needed. Use in each nostril as directed  . Calcium Carbonate-Vitamin D (CALCIUM 600/VITAMIN D PO) Take 1 tablet by mouth 2 (two) times daily.  . Diclofenac Sodium (VOLTAREN EX) Apply topically as needed.  . ezetimibe (ZETIA)  10 MG tablet TAKE 1 TABLET(10 MG) BY MOUTH DAILY  . fish oil-omega-3 fatty acids 1000 MG capsule Take 2 g by mouth daily.  . fluticasone (FLONASE) 50 MCG/ACT nasal spray Place into both nostrils daily.  Marland Kitchen loratadine (CLARITIN) 10 MG tablet Take 10 mg by mouth daily as needed for allergies.  . melatonin 5 MG TABS Take 5 mg by mouth at bedtime as needed.  . metoprolol tartrate (LOPRESSOR) 25 MG tablet Take 0.5 tablets (12.5 mg total) by mouth 2 (two) times daily. Please cut in 1/2 for patient  . pantoprazole (PROTONIX) 40 MG tablet Take 1 tablet (40 mg total) by mouth daily. 30 min before food  . simvastatin (ZOCOR) 40 MG tablet Take 1 tablet (40 mg total) by mouth daily.  . vitamin E 400 UNIT capsule Take 400 Units by mouth daily.    No facility-administered encounter medications on file as of 11/30/2020.    Allergies (verified) Dexilant [dexlansoprazole] and Sulfonamide derivatives   History: Past Medical History:  Diagnosis Date  . Anxiety   . Basal cell carcinoma 03/2019   right upper forehead, MOHS  . Basal cell carcinoma 10/2012   Left upper back  . Fatty liver 2010  . GERD (gastroesophageal reflux disease)   . H. pylori infection   . Hemangioma 2010  . Hiatal hernia   . Hyperlipidemia   . Liver hemangioma    Past Surgical History:  Procedure Laterality Date  .  bilateral lens replacement    . CHOLECYSTECTOMY    . KNEE SURGERY  06/29/2007   Meniscus tear, left knee surgery   Family History  Problem Relation Age of Onset  . Hypertension Sister   . Skin cancer Sister   . Hypertension Brother   . Hypertension Sister   . Heart attack Sister        75 y.o as of 09/10/19   . Anxiety disorder Sister   . Colon cancer Neg Hx    Social History   Socioeconomic History  . Marital status: Widowed    Spouse name: Not on file  . Number of children: 2  . Years of education: Not on file  . Highest education level: Not on file  Occupational History  . Occupation: Retired  Garment/textile technologist  Tobacco Use  . Smoking status: Never Smoker  . Smokeless tobacco: Never Used  Vaping Use  . Vaping Use: Never used  Substance and Sexual Activity  . Alcohol use: No  . Drug use: No  . Sexual activity: Not Currently    Birth control/protection: None  Other Topics Concern  . Not on file  Social History Narrative   Has living will.  Widow as of 04/08/18.  Daughters have a copy of living will and [name] would be HPOA if husband was not able.   1 daughter lives in [place] has 4 kids and 1 daughter in [place] with 2 kids      Would desire CPR.  She would not want heroic measures.            Social Determinants of Health   Financial Resource Strain: Low Risk   . Difficulty of Paying Living Expenses: Not hard at all  Food Insecurity: No Food Insecurity  . Worried About Charity fundraiser in the Last Year: Never true  . Ran Out of Food in the Last Year: Never true  Transportation Needs: No Transportation Needs  . Lack of Transportation (Medical): No  . Lack of Transportation (Non-Medical): No  Physical Activity: Not on file  Stress: No Stress Concern Present  . Feeling of Stress : Not at all  Social Connections: Unknown  . Frequency of Communication with Friends and Family: More than three times a week  . Frequency of Social Gatherings with Friends and Family: More than three times a week  . Attends Religious Services: Not on file  . Active Member of Clubs or Organizations: Not on file  . Attends Archivist Meetings: Not on file  . Marital Status: Widowed    Tobacco Counseling Counseling given: Not Answered   Clinical Intake:  Pre-visit preparation completed: Yes        Diabetes: No  How often do you need to have someone help you when you read instructions, pamphlets, or other written materials from your doctor or pharmacy?: 1 - Never Interpreter Needed?: No      Activities of Daily Living In your present  state of health, do you have any difficulty performing the following activities: 11/30/2020  Hearing? N  Vision? N  Difficulty concentrating or making decisions? N  Walking or climbing stairs? N  Dressing or bathing? N  Doing errands, shopping? N  Preparing Food and eating ? N  Using the Toilet? N  In the past six months, have you accidently leaked urine? N  Do you have problems with loss of bowel control? N  Managing your Medications? N  Managing your Finances? N  Housekeeping or managing your Housekeeping? N  Some recent data might be hidden    Patient Care Team: McLean-Scocuzza, Nino Glow, MD as PCP - General (Internal Medicine) Johnney Ou., MD as Consulting Physician (Internal Medicine) Inda Castle, MD (Inactive) as Consulting Physician (Gastroenterology)  Indicate any recent Medical Services you may have received from other than [facility] providers in the past year (date may be approximate).     Assessment:   This is a routine wellness examination for [place].  I connected with Alexxus today by telephone and verified that I am speaking with the correct person using two identifiers. Location patient: home Location provider: work Persons participating in the virtual visit: patient, Marine scientist.    I discussed the limitations, risks, security and privacy concerns of performing an evaluation and management service by telephone and the availability of in person appointments. The patient expressed understanding and verbally consented to this telephonic visit.    Interactive audio and video telecommunications were attempted between this provider and patient, however failed, due to patient having technical difficulties OR patient did not have access to video capability.  We continued and completed visit with audio only.  Some vital signs may be absent or patient reported.   Hearing/Vision screen  Hearing Screening   125Hz  250Hz  500Hz  1000Hz  2000Hz  3000Hz  4000Hz  6000Hz  8000Hz    Right ear:           Left ear:           Comments: Patient is able to hear conversational tones without difficulty. No issues reported.   Vision Screening Comments: Visual acuity not assessed, virtual visit. They have seen their ophthalmologist.   Dietary issues and exercise activities discussed: Current Exercise Habits: Home exercise routine  Healthy diet  Good water intake  Goals    . Follow up with Primary Care Provider     As needed.    . Increase physical activity     Stretch  Stay active      Depression Screen PHQ 2/9 Scores 11/30/2020 08/04/2020 03/16/2020 11/30/2019 10/26/2019 08/12/2018 07/31/2017  PHQ - 2 Score 0 0 0 0 0 0 0  PHQ- 9 Score - - - - - - -    Fall Risk Fall Risk  11/30/2020 11/07/2020 09/27/2020 08/04/2020 03/16/2020  Falls in the past year? 0 0 0 0 0  Number falls in past yr: 0 0 0 0 -  Injury with Fall? 0 0 0 0 -  Follow up Falls evaluation completed Falls evaluation completed Falls evaluation completed Falls evaluation completed Falls evaluation completed    FALL RISK PREVENTION PERTAINING TO THE HOME: Handrails in use when climbing stairs? Yes Home free of loose throw rugs in walkways, pet beds, electrical cords, etc? Yes  Adequate lighting in your home to reduce risk of falls? Yes   ASSISTIVE DEVICES UTILIZED TO PREVENT FALLS: Use of a cane, walker or w/c? No   TIMED UP AND GO: Was the test performed? No . Virtual visit.    Cognitive Function: MMSE - Mini Mental State Exam 07/29/2017 07/22/2016  Orientation to time 5 5  Orientation to Place 5 5  Registration 3 3  Attention/ Calculation 0 0  Recall 3 3  Language- name 2 objects 0 0  Language- repeat 1 1  Language- follow 3 step command 3 3  Language- read & follow direction 0 0  Write a sentence 0 0  Copy design 0 0  Total score 20 20     6CIT Screen  11/30/2020 11/30/2019  What Year? 0 points 0 points  What month? 0 points 0 points  What time? 0 points 0 points  Count back from 20 0 points -   Months in reverse 0 points -    Immunizations Immunization History  Administered Date(s) Administered  . Fluad Quad(high Dose 65+) 08/16/2019  . Influenza Split 07/10/2011  . Influenza Whole 07/27/2010, 07/18/2012  . Influenza, High Dose Seasonal PF 07/21/2020  . Influenza,inj,Quad PF,6+ Mos 07/14/2014  . Influenza-Unspecified 06/28/2013, 06/19/2015  . PFIZER(Purple Top)SARS-COV-2 Vaccination 12/15/2019, 01/05/2020, 09/27/2020  . Pneumococcal Conjugate-13 04/05/2014  . Pneumococcal Polysaccharide-23 01/13/2013  . Td 11/30/2008  . Zoster 11/30/2008  . Zoster Recombinat (Shingrix) 01/29/2018, 05/06/2018    TDAP status: Due, Education has been provided regarding the importance of this vaccine. Advised may receive this vaccine at local pharmacy or Health Dept. Aware to provide a copy of the vaccination record if obtained from local pharmacy or Health Dept. Verbalized acceptance and understanding. Deferred. Patient thinks she received this vaccine within the last year. Plans to update immunization record within the office.   Health Maintenance Health Maintenance  Topic Date Due  . TETANUS/TDAP  11/30/2018  . COVID-19 Vaccine (4 - Booster for Pfizer series) 03/28/2021  . INFLUENZA VACCINE  Completed  . DEXA SCAN  Completed  . Hepatitis C Screening  Completed  . PNA vac Low Risk Adult  Completed    Colorectal cancer screening: Type of screening: Cologuard. Completed 08/19/18. Repeat every 3 years   Mammogram status: Completed 08/09/20. Repeat every year  Bone Density status: Completed 07/01/19. Results reflect: Bone density results: OSTEOPENIA. Repeat every 2 years. Calcium Carbonate-Vitamin D (CALCIUM 600/VITAMIN D PO).   Lung Cancer Screening: (Low Dose CT Chest recommended if Age 13-80 years, 30 pack-year currently smoking OR have quit w/in 15years.) does not qualify.    Hepatitis C Screening: Completed 07/22/16.  Vision Screening: Recommended annual ophthalmology exams for  early detection of glaucoma and other disorders of the eye. Is the patient up to date with their annual eye exam?  Yes   Dental Screening: Recommended annual dental exams for proper oral hygiene.  Community Resource Referral / Chronic Care Management: CRR required this visit?  No   CCM required this visit?  No      Plan:   Keep all routine maintenance appointments.   Next scheduled lab 09/07/21 @ 9:15  Follow up 09/11/21 @ 10:30  I have personally reviewed and noted the following in the patient's chart:   . Medical and social history . Use of alcohol, tobacco or illicit drugs  . Current medications and supplements . Functional ability and status . Nutritional status . Physical activity . Advanced directives . List of other physicians . Hospitalizations, surgeries, and ER visits in previous 12 months . Vitals . Screenings to include cognitive, depression, and falls . Referrals and appointments  In addition, I have reviewed and discussed with patient certain preventive protocols, quality metrics, and best practice recommendations. A written personalized care plan for preventive services as well as general preventive health recommendations were provided to patient via mychart.     Varney Biles, LPN   [id number]

## 2020-11-30 NOTE — Patient Instructions (Addendum)
Leah Melendez , Thank you for taking time to come for your Medicare Wellness Visit. I appreciate your ongoing commitment to your health goals. Please review the following plan we discussed and let me know if I can assist you in the future.   These are the goals we discussed: Goals    . Follow up with Primary Care Provider     As needed.    . Increase physical activity     Stretch  Stay active       This is a list of the screening recommended for you and due dates:  Health Maintenance  Topic Date Due  . Tetanus Vaccine  11/30/2018  . COVID-19 Vaccine (4 - Booster for Pfizer series) 03/28/2021  . Flu Shot  Completed  . DEXA scan (bone density measurement)  Completed  .  Hepatitis C: One time screening is recommended by Center for Disease Control  (CDC) for  adults born from [birth year] through 1967.   Completed  . Pneumonia vaccines  Completed    Immunizations Immunization History  Administered Date(s) Administered  . Fluad Quad(high Dose 65+) 08/16/2019  . Influenza Split 07/10/2011  . Influenza Whole 07/27/2010, 07/18/2012  . Influenza, High Dose Seasonal PF 07/21/2020  . Influenza,inj,Quad PF,6+ Mos 07/14/2014  . Influenza-Unspecified 06/28/2013, 06/19/2015  . PFIZER(Purple Top)SARS-COV-2 Vaccination 12/15/2019, 01/05/2020, 09/27/2020  . Pneumococcal Conjugate-13 04/05/2014  . Pneumococcal Polysaccharide-23 01/13/2013  . Td 11/30/2008  . Zoster 11/30/2008  . Zoster Recombinat (Shingrix) 01/29/2018, 05/06/2018   Keep all routine maintenance appointments.   Next scheduled lab 09/07/21 @ 9:15  Follow up 09/11/21 @ 10:30  Advanced directives: End of life planning; Advance aging; Advanced directives discussed.  Copy of current HCPOA/Living Will requested.    Conditions/risks identified: none new.   Follow up in one year for your annual wellness visit.   Preventive Care 77 Years and Older, Female Preventive care refers to lifestyle choices and visits with your health care  provider that can promote health and wellness. What does preventive care include?  A yearly physical exam. This is also called an annual well check.  Dental exams once or twice a year.  Routine eye exams. Ask your health care provider how often you should have your eyes checked.  Personal lifestyle choices, including:  Daily care of your teeth and gums.  Regular physical activity.  Eating a healthy diet.  Avoiding tobacco and drug use.  Limiting alcohol use.  Practicing safe sex.  Taking low-dose aspirin every day.  Taking vitamin and mineral supplements as recommended by your health care provider. What happens during an annual well check? The services and screenings done by your health care provider during your annual well check will depend on your age, overall health, lifestyle risk factors, and family history of disease. Counseling  Your health care provider may ask you questions about your:  Alcohol use.  Tobacco use.  Drug use.  Emotional well-being.  Home and relationship well-being.  Sexual activity.  Eating habits.  History of falls.  Memory and ability to understand (cognition).  Work and work Statistician.  Reproductive health. Screening  You may have the following tests or measurements:  Height, weight, and BMI.  Blood pressure.  Lipid and cholesterol levels. These may be checked every 5 years, or more frequently if you are over 96 years old.  Skin check.  Lung cancer screening. You may have this screening every year starting at age 3 if you have a 30-pack-year history of smoking and currently  smoke or have quit within the past 15 years.  Fecal occult blood test (FOBT) of the stool. You may have this test every year starting at age 48.  Flexible sigmoidoscopy or colonoscopy. You may have a sigmoidoscopy every 5 years or a colonoscopy every 10 years starting at age 37.  Hepatitis C blood test.  Hepatitis B blood test.  Sexually  transmitted disease (STD) testing.  Diabetes screening. This is done by checking your blood sugar (glucose) after you have not eaten for a while (fasting). You may have this done every 1-3 years.  Bone density scan. This is done to screen for osteoporosis. You may have this done starting at age 68.  Mammogram. This may be done every 1-2 years. Talk to your health care provider about how often you should have regular mammograms. Talk with your health care provider about your test results, treatment options, and if necessary, the need for more tests. Vaccines  Your health care provider may recommend certain vaccines, such as:  Influenza vaccine. This is recommended every year.  Tetanus, diphtheria, and acellular pertussis (Tdap, Td) vaccine. You may need a Td booster every 10 years.  Zoster vaccine. You may need this after age 49.  Pneumococcal 13-valent conjugate (PCV13) vaccine. One dose is recommended after age 16.  Pneumococcal polysaccharide (PPSV23) vaccine. One dose is recommended after age 37. Talk to your health care provider about which screenings and vaccines you need and how often you need them. This information is not intended to replace advice given to you by your health care provider. Make sure you discuss any questions you have with your health care provider. Document Released: 11/10/2015 Document Revised: 07/03/2016 Document Reviewed: 08/15/2015 Elsevier Interactive Patient Education  2017 [place] Prevention in the Home Falls can cause injuries. They can happen to people of all ages. There are many things you can do to make your home safe and to help prevent falls. What can I do on the outside of my home?  Regularly fix the edges of walkways and driveways and fix any cracks.  Remove anything that might make you trip as you walk through a door, such as a raised step or threshold.  Trim any bushes or trees on the path to your home.  Use bright outdoor  lighting.  Clear any walking paths of anything that might make someone trip, such as rocks or tools.  Regularly check to see if handrails are loose or broken. Make sure that both sides of any steps have handrails.  Any raised decks and porches should have guardrails on the edges.  Have any leaves, snow, or ice cleared regularly.  Use sand or salt on walking paths during winter.  Clean up any spills in your garage right away. This includes oil or grease spills. What can I do in the bathroom?  Use night lights.  Install grab bars by the toilet and in the tub and shower. Do not use towel bars as grab bars.  Use non-skid mats or decals in the tub or shower.  If you need to sit down in the shower, use a plastic, non-slip stool.  Keep the floor dry. Clean up any water that spills on the floor as soon as it happens.  Remove soap buildup in the tub or shower regularly.  Attach bath mats securely with double-sided non-slip rug tape.  Do not have throw rugs and other things on the floor that can make you trip. What can I do in  the bedroom?  Use night lights.  Make sure that you have a light by your bed that is easy to reach.  Do not use any sheets or blankets that are too big for your bed. They should not hang down onto the floor.  Have a firm chair that has side arms. You can use this for support while you get dressed.  Do not have throw rugs and other things on the floor that can make you trip. What can I do in the kitchen?  Clean up any spills right away.  Avoid walking on wet floors.  Keep items that you use a lot in easy-to-reach places.  If you need to reach something above you, use a strong step stool that has a grab bar.  Keep electrical cords out of the way.  Do not use floor polish or wax that makes floors slippery. If you must use wax, use non-skid floor wax.  Do not have throw rugs and other things on the floor that can make you trip. What can I do with my  stairs?  Do not leave any items on the stairs.  Make sure that there are handrails on both sides of the stairs and use them. Fix handrails that are broken or loose. Make sure that handrails are as long as the stairways.  Check any carpeting to make sure that it is firmly attached to the stairs. Fix any carpet that is loose or worn.  Avoid having throw rugs at the top or bottom of the stairs. If you do have throw rugs, attach them to the floor with carpet tape.  Make sure that you have a light switch at the top of the stairs and the bottom of the stairs. If you do not have them, ask someone to add them for you. What else can I do to help prevent falls?  Wear shoes that:  Do not have high heels.  Have rubber bottoms.  Are comfortable and fit you well.  Are closed at the toe. Do not wear sandals.  If you use a stepladder:  Make sure that it is fully opened. Do not climb a closed stepladder.  Make sure that both sides of the stepladder are locked into place.  Ask someone to hold it for you, if possible.  Clearly mark and make sure that you can see:  Any grab bars or handrails.  First and last steps.  Where the edge of each step is.  Use tools that help you move around (mobility aids) if they are needed. These include:  Canes.  Walkers.  Scooters.  Crutches.  Turn on the lights when you go into a dark area. Replace any light bulbs as soon as they burn out.  Set up your furniture so you have a clear path. Avoid moving your furniture around.  If any of your floors are uneven, fix them.  If there are any pets around you, be aware of where they are.  Review your medicines with your doctor. Some medicines can make you feel dizzy. This can increase your chance of falling. Ask your doctor what other things that you can do to help prevent falls. This information is not intended to replace advice given to you by your health care provider. Make sure you discuss any  questions you have with your health care provider. Document Released: 08/10/2009 Document Revised: 03/21/2016 Document Reviewed: 11/18/2014 Elsevier Interactive Patient Education  2017 [employer].

## 2021-01-09 ENCOUNTER — Encounter: Payer: Self-pay | Admitting: Dermatology

## 2021-01-09 ENCOUNTER — Other Ambulatory Visit: Payer: Self-pay

## 2021-01-09 ENCOUNTER — Ambulatory Visit: Payer: Medicare PPO | Admitting: Dermatology

## 2021-01-09 DIAGNOSIS — D2371 Other benign neoplasm of skin of right lower limb, including hip: Secondary | ICD-10-CM

## 2021-01-09 DIAGNOSIS — L720 Epidermal cyst: Secondary | ICD-10-CM

## 2021-01-09 DIAGNOSIS — Z1283 Encounter for screening for malignant neoplasm of skin: Secondary | ICD-10-CM | POA: Diagnosis not present

## 2021-01-09 DIAGNOSIS — L820 Inflamed seborrheic keratosis: Secondary | ICD-10-CM

## 2021-01-09 DIAGNOSIS — Z85828 Personal history of other malignant neoplasm of skin: Secondary | ICD-10-CM

## 2021-01-09 DIAGNOSIS — D229 Melanocytic nevi, unspecified: Secondary | ICD-10-CM

## 2021-01-09 DIAGNOSIS — D225 Melanocytic nevi of trunk: Secondary | ICD-10-CM

## 2021-01-09 DIAGNOSIS — L821 Other seborrheic keratosis: Secondary | ICD-10-CM

## 2021-01-09 DIAGNOSIS — D2239 Melanocytic nevi of other parts of face: Secondary | ICD-10-CM

## 2021-01-09 DIAGNOSIS — L578 Other skin changes due to chronic exposure to nonionizing radiation: Secondary | ICD-10-CM | POA: Diagnosis not present

## 2021-01-09 DIAGNOSIS — D239 Other benign neoplasm of skin, unspecified: Secondary | ICD-10-CM

## 2021-01-09 DIAGNOSIS — D1800 Hemangioma unspecified site: Secondary | ICD-10-CM

## 2021-01-09 DIAGNOSIS — L814 Other melanin hyperpigmentation: Secondary | ICD-10-CM

## 2021-01-09 NOTE — Patient Instructions (Signed)

## 2021-01-09 NOTE — Progress Notes (Signed)
Follow-Up Visit   Subjective  Leah Melendez is a 75 y.o. female who presents for the following: Total body skin exam (Hx of BCC R upper forehead, L upper back) and check spots (L leg, R knee, R back, itchy).   The following portions of the chart were reviewed this encounter and updated as appropriate:       Review of Systems:  No other skin or systemic complaints except as noted in HPI or Assessment and Plan.  Objective  Well appearing patient in no apparent distress; mood and affect are within normal limits.  A full examination was performed including scalp, head, eyes, ears, nose, lips, neck, chest, axillae, abdomen, back, buttocks, bilateral upper extremities, bilateral lower extremities, hands, feet, fingers, toes, fingernails, and toenails. All findings within normal limits unless otherwise noted below.  Objective  R upper forehead, L upper back: Well healed scars with no evidence of recurrence.   Objective  L mid pretibia x 1, R upper back x 2 (3): Erythematous keratotic or waxy stuck-on papule   Objective  Right Knee, R spinal upper back, L upper eyebrow: Dilated pore R knee Dilated pore upper back Dilated pore L upper eyebrow  Objective  Right anterior ankle: Firm pink/brown papulenodule with dimple sign.   Objective  Nasal root: Smooth white papule(s).   Left Abdomen  Objective  Left Abdomen: 9.0 x 5.67mm brown macule   Assessment & Plan    Lentigines - Scattered tan macules - Due to sun exposure - Benign-appering, observe - Recommend daily broad spectrum sunscreen SPF 30+ to sun-exposed areas, reapply every 2 hours as needed. - Call for any changes -arms, trunk  Seborrheic Keratoses - Stuck-on, waxy, tan-brown papules and plaques  - Discussed benign etiology and prognosis. - Observe - Call for any changes - arms, trunk  Melanocytic Nevi - Tan-brown and/or pink-flesh-colored symmetric macules and papules - Benign appearing on exam  today - Observation - Call clinic for new or changing moles - Recommend daily use of broad spectrum spf 30+ sunscreen to sun-exposed areas.  - trunk  Hemangiomas - Red papules - Discussed benign nature - Observe - Call for any changes - trunk   Actinic Damage - Chronic, secondary to cumulative UV/sun exposure - diffuse scaly erythematous macules with underlying dyspigmentation - Recommend daily broad spectrum sunscreen SPF 30+ to sun-exposed areas, reapply every 2 hours as needed.  - Call for new or changing lesions. - chest  Skin cancer screening performed today.   History of basal cell carcinoma (BCC) R upper forehead, L upper back  Clear. Observe for recurrence. Call clinic for new or changing lesions.  Recommend regular skin exams, daily broad-spectrum spf 30+ sunscreen use, and photoprotection.     Inflamed seborrheic keratosis (3) L mid pretibia x 1, R upper back x 2  Destruction of lesion - L mid pretibia x 1, R upper back x 2  Destruction method: cryotherapy   Informed consent: discussed and consent obtained   Lesion destroyed using liquid nitrogen: Yes   Region frozen until ice ball extended beyond lesion: Yes   Outcome: patient tolerated procedure well with no complications   Post-procedure details: wound care instructions given    Dilated pore of [place] Right Knee, R spinal upper back, L upper eyebrow  Benign, observe  Extraction x 1 today to R knee (diagnostic- no charge)  Discussed excising R spinal upper back if bothersome  Benign, observe L upper eyebrow  Dermatofibroma Right anterior ankle  Benign,  observe  Milia Nasal root  Benign, observe  Nevus Left Abdomen  Benign-appearing.  Observation.  Call clinic for new or changing moles.  Recommend daily use of broad spectrum spf 30+ sunscreen to sun-exposed areas.    Return in about 1 year (around 01/09/2022) for TBSE, Hx of BCC.  I, Othelia Pulling, RMA, am acting as scribe for Brendolyn Patty,  MD .  Documentation: I have reviewed the above documentation for accuracy and completeness, and I agree with the above.  Brendolyn Patty MD

## 2021-03-08 ENCOUNTER — Other Ambulatory Visit: Payer: Self-pay | Admitting: Cardiovascular Disease

## 2021-03-08 DIAGNOSIS — E785 Hyperlipidemia, unspecified: Secondary | ICD-10-CM

## 2021-04-11 ENCOUNTER — Encounter: Payer: Self-pay | Admitting: Internal Medicine

## 2021-04-11 ENCOUNTER — Telehealth: Payer: Self-pay | Admitting: Internal Medicine

## 2021-04-11 ENCOUNTER — Other Ambulatory Visit: Payer: Self-pay | Admitting: Internal Medicine

## 2021-04-11 DIAGNOSIS — U071 COVID-19: Secondary | ICD-10-CM

## 2021-04-11 DIAGNOSIS — J40 Bronchitis, not specified as acute or chronic: Secondary | ICD-10-CM

## 2021-04-11 HISTORY — DX: COVID-19: U07.1

## 2021-04-11 HISTORY — DX: Bronchitis, not specified as acute or chronic: J40

## 2021-04-11 MED ORDER — PROMETHAZINE-DM 6.25-15 MG/5ML PO SYRP: 5.0000 mL | ORAL_SOLUTION | Freq: Four times a day (QID) | ORAL | 0 refills | Status: AC | PRN

## 2021-04-11 MED ORDER — PAXLOVID 20 X 150 MG & 10 X 100MG PO TBPK: 3.0000 | ORAL_TABLET | Freq: Two times a day (BID) | ORAL | 0 refills | Status: AC

## 2021-04-11 NOTE — Telephone Encounter (Signed)
Leah Lima, MD  McLean-Scocuzza, Nino Glow, MD I got a call tonight that she has been sick for 2 days with COVID symptoms and tested positive for COVID.  She appears to be high risk so I prescribed paxlovid and Phenergan DM.  I left her a MyChart message to inform her not to take simvastatin for the next 10 days.  Please call her tomorrow and confirm that she got the message.     Arianna please call pt tomorrow +covid  If appt needed sch virtually thanks

## 2021-04-12 NOTE — Telephone Encounter (Signed)
Leah Lima, MD  Internal Medicine  Medication sent in for Patient by on call doctor listed above. Please advise

## 2021-04-12 NOTE — Telephone Encounter (Signed)
Caller states that she has been sick x1day and has a positive covid test. C/o chills, headache, cough, body aches, eye and ear pain, wheezing.

## 2021-04-12 NOTE — Telephone Encounter (Signed)
Try to add pt on to my sch 04/13/21 telephone Is fine

## 2021-04-13 ENCOUNTER — Other Ambulatory Visit: Payer: Self-pay | Admitting: Internal Medicine

## 2021-04-13 ENCOUNTER — Telehealth (INDEPENDENT_AMBULATORY_CARE_PROVIDER_SITE_OTHER): Payer: Medicare PPO | Admitting: Internal Medicine

## 2021-04-13 ENCOUNTER — Encounter: Payer: Self-pay | Admitting: Internal Medicine

## 2021-04-13 VITALS — BP 122/78 | Ht 61.0 in | Wt 170.0 lb

## 2021-04-13 DIAGNOSIS — J9801 Acute bronchospasm: Secondary | ICD-10-CM

## 2021-04-13 DIAGNOSIS — J0110 Acute frontal sinusitis, unspecified: Secondary | ICD-10-CM

## 2021-04-13 DIAGNOSIS — R059 Cough, unspecified: Secondary | ICD-10-CM | POA: Diagnosis not present

## 2021-04-13 DIAGNOSIS — J029 Acute pharyngitis, unspecified: Secondary | ICD-10-CM | POA: Diagnosis not present

## 2021-04-13 DIAGNOSIS — U071 COVID-19: Secondary | ICD-10-CM

## 2021-04-13 MED ORDER — DM-GUAIFENESIN ER 60-1200 MG PO TB12: 1.0000 | ORAL_TABLET | Freq: Every day | ORAL | 0 refills | Status: DC | PRN

## 2021-04-13 MED ORDER — AZITHROMYCIN 250 MG PO TABS: ORAL_TABLET | ORAL | 0 refills | Status: AC

## 2021-04-13 MED ORDER — ALBUTEROL SULFATE HFA 108 (90 BASE) MCG/ACT IN AERS
1.0000 | INHALATION_SPRAY | Freq: Four times a day (QID) | RESPIRATORY_TRACT | 0 refills | Status: DC | PRN
Start: 2021-04-13 — End: 2021-04-16

## 2021-04-13 NOTE — Patient Instructions (Addendum)
While sick These are over the counter medication options:  Mucinex dm green label for cough.  Vitamin C 1000 mg daily.  Vitamin D3 4000 Iu (units) daily.  Zinc 100 mg daily.  Quercetin 250-500 mg 2 times per day   Elderberry  Oil of oregano  cepacol or chloroseptic spray sore throat  Warm salt water gargles  Warm tea with honey and lemon  Sugar free cough drops Hydration with water Try to eat though you dont feel like it   Tylenol for pain Nasal saline and  Flonase 2 sprays if needed nasal congestion  If sneezing, runny nose cough consider over the counter zyrtec, allegra, claritin or xyzal  For  diarrhea  bland diet bananas, rice, applesauce, toast, broth, and peptobismol, immodium for diarrhea   Monitor pulse oximeter, buy from [place] if oxygen is less than 90 please go to the hospital.  Buy a spirometer [employer] or [employer]    Are you feeling really sick? Shortness of breath, cough, chest pain?, dizziness? Confusion   If so let me know  If worsening, go to hospital or [facility] clinic Urgent care for further treatment.

## 2021-04-13 NOTE — Progress Notes (Signed)
Patient having sore throat, yellow diarrhea and sever headache since Monday.

## 2021-04-13 NOTE — Telephone Encounter (Signed)
Patient scheduled for 4 pm appointment today 6/17

## 2021-04-16 ENCOUNTER — Encounter: Payer: Self-pay | Admitting: Internal Medicine

## 2021-04-16 MED ORDER — ALBUTEROL SULFATE HFA 108 (90 BASE) MCG/ACT IN AERS: 1.0000 | INHALATION_SPRAY | Freq: Four times a day (QID) | RESPIRATORY_TRACT | 0 refills | Status: DC | PRN

## 2021-04-16 NOTE — Progress Notes (Signed)
Telephone Note  I connected with Leah Melendez  on 04/13/21 at  4:40 PM EDT by telephone and verified that I am speaking with the correct person using two identifiers.  Location patient: home, [place] Location provider:work or home office Persons participating in the virtual visit: patient, provider  I discussed the limitations of evaluation and management by telemedicine and the availability of in person appointments. The patient expressed understanding and agreed to proceed.   HPI:  Acute telemedicine visit for : Covid 19 + home test 04/11/21 given paxlovid bid on call doctor Dr. Scarlette Calico. Pt went to wedding weekend prior with family and out to eat and contracted covid. C/o diarrhea/loose stools 2x per day, no fever temp 95.76F, + chills, +reduced appetite, +sore throat, +left sinus pressure and left ear pain 6/10. She is having cough with clear mucous and was wheezing but resp. Sx's improved since starting paxlovid. She has tried immune support vitamins. Also her 2 out of 3 sisters + covid not sure is other sister Tessie Fass is or her brother   Noted this is around year mark of death of her husband -COVID-19 vaccine status: 3/3  ROS: See pertinent positives and negatives per HPI.  Past Medical History:  Diagnosis Date   Anxiety    Basal cell carcinoma 03/2019   right upper forehead, MOHS   Basal cell carcinoma 10/2012   Left upper back   COVID-19    04/11/21 tx'ed paxlovid on call Dr. Scarlette Calico   Fatty liver 2010   GERD (gastroesophageal reflux disease)    H. pylori infection    Hemangioma 2010   Hiatal hernia    Hyperlipidemia    Liver hemangioma     Past Surgical History:  Procedure Laterality Date   bilateral lens replacement     CHOLECYSTECTOMY     KNEE SURGERY  06/29/2007   Meniscus tear, left knee surgery     Current Outpatient Medications:    Ascorbic Acid (VITAMIN C) 500 MG tablet, Take 500 mg by mouth daily., Disp: , Rfl:    azithromycin (ZITHROMAX) 250  MG tablet, Take 2 tablets on day 1, then 1 tablet daily on days 2 through 5 with food, Disp: 6 tablet, Rfl: 0   Dextromethorphan-Guaifenesin 60-1200 MG 12hr tablet, Take 1 tablet by mouth daily as needed., Disp: 30 tablet, Rfl: 0   ezetimibe (ZETIA) 10 MG tablet, TAKE 1 TABLET(10 MG) BY MOUTH DAILY, Disp: 90 tablet, Rfl: 3   metoprolol tartrate (LOPRESSOR) 25 MG tablet, Take 0.5 tablets (12.5 mg total) by mouth 2 (two) times daily. Please cut in 1/2 for patient, Disp: 90 tablet, Rfl: 3   pantoprazole (PROTONIX) 40 MG tablet, Take 1 tablet (40 mg total) by mouth daily. 30 min before food, Disp: 90 tablet, Rfl: 3   PAXLOVID 20 x 150 MG & 10 x 100MG  TBPK, Take 3 tablets by mouth 2 (two) times daily for 5 days., Disp: 30 tablet, Rfl: 0   promethazine-dextromethorphan (PROMETHAZINE-DM) 6.25-15 MG/5ML syrup, Take 5 mLs by mouth 4 (four) times daily as needed for up to 5 days for cough., Disp: 118 mL, Rfl: 0   albuterol (VENTOLIN HFA) 108 (90 Base) MCG/ACT inhaler, INHALE 1 TO 2 PUFFS INTO THE LUNGS EVERY 6 HOURS AS NEEDED FOR WHEEZING OR SHORTNESS OF BREATH OR COUGH, Disp: 54 g, Rfl: 1   albuterol (VENTOLIN HFA) 108 (90 Base) MCG/ACT inhaler, Inhale 1-2 puffs into the lungs every 6 (six) hours as needed for wheezing or shortness of breath.  Cough, wheezing, Disp: 18 g, Rfl: 0   ALPRAZolam (XANAX) 0.25 MG tablet, Take 0.5 mg by mouth 2 (two) times daily as needed.  (Patient not taking: Reported on 04/13/2021), Disp: , Rfl:    aspirin EC 81 MG tablet, Take 81 mg by mouth daily. (Patient not taking: Reported on 04/13/2021), Disp: , Rfl:    azelastine (ASTELIN) 0.1 % nasal spray, Place 2 sprays into both nostrils as needed. Use in each nostril as directed (Patient not taking: Reported on 04/13/2021), Disp: , Rfl:    Calcium Carbonate-Vitamin D (CALCIUM 600/VITAMIN D PO), Take 1 tablet by mouth 2 (two) times daily. (Patient not taking: Reported on 04/13/2021), Disp: , Rfl:    Diclofenac Sodium (VOLTAREN EX), Apply  topically as needed. (Patient not taking: Reported on 04/13/2021), Disp: , Rfl:    fish oil-omega-3 fatty acids 1000 MG capsule, Take 2 g by mouth daily. (Patient not taking: Reported on 04/13/2021), Disp: , Rfl:    fluticasone (FLONASE) 50 MCG/ACT nasal spray, Place into both nostrils daily. (Patient not taking: Reported on 04/13/2021), Disp: , Rfl:    loratadine (CLARITIN) 10 MG tablet, Take 10 mg by mouth daily as needed for allergies. (Patient not taking: Reported on 04/13/2021), Disp: , Rfl:    melatonin 5 MG TABS, Take 5 mg by mouth at bedtime as needed. (Patient not taking: Reported on 04/13/2021), Disp: , Rfl:    simvastatin (ZOCOR) 40 MG tablet, Take 1 tablet (40 mg total) by mouth daily. (Patient not taking: Reported on 04/13/2021), Disp: 90 tablet, Rfl: 3   vitamin E 400 UNIT capsule, Take 400 Units by mouth daily.  (Patient not taking: Reported on 04/13/2021), Disp: , Rfl:    zinc gluconate 50 MG tablet, Take 50 mg by mouth daily. (Patient not taking: Reported on 04/13/2021), Disp: , Rfl:   EXAM:  VITALS per patient if applicable:  GENERAL: alert, oriented, appears well and in no acute distress  LUNGS: via phone no signs of respiratory distress, breathing rate appears normal, no obvious gross SOB, gasping or wheezing but + cough   PSYCH/NEURO: pleasant and cooperative, no obvious depression or anxiety, speech and thought processing grossly intact  ASSESSMENT AND PLAN:  Discussed the following assessment and plan:  COVID-19 with bronchitis/bronchospasm- Plan: albuterol (VENTOLIN HFA) 108 (90 Base) MCG/ACT inhaler and see below Had 3/3 covid shots wait 90 days before 4th   Sore throat/sinusitis - Plan: azithromycin (ZITHROMAX) 250 MG tablet  Cough - Plan: Dextromethorphan-Guaifenesin 60-1200 MG 12hr tablet alt with promethazine/DM, albuterol (VENTOLIN HFA) 108 (90 Base) MCG/ACT inhaler Cough drops and warm tea with honey and lemon    While sick These are over the counter medication  options:  Mucinex dm green label for cough.  Vitamin C 1000 mg daily.  Vitamin D3 4000 Iu (units) daily.  Zinc 100 mg daily.  Quercetin 250-500 mg 2 times per day   Elderberry  Oil of oregano  cepacol or chloroseptic spray sore throat  Warm salt water gargles  Warm tea with honey and lemon  Sugar free cough drops Hydration with water Try to eat though you dont feel like it   Tylenol for pain Nasal saline and  Flonase 2 sprays if needed nasal congestion  If sneezing, runny nose cough consider over the counter zyrtec, allegra, claritin or xyzal  For  diarrhea  bland diet bananas, rice, applesauce, toast, broth, and peptobismol, immodium for diarrhea   Monitor pulse oximeter, buy from [place] if oxygen is less than 90  please go to the hospital.  Buy a spirometer [employer] or [facility]    Are you feeling really sick? Shortness of breath, cough, chest pain?, dizziness? Confusion   If so let me know  If worsening, go to hospital or [facility] clinic Urgent care for further treatment.     -we discussed possible serious and likely etiologies, options for evaluation and workup, limitations of telemedicine visit vs in person visit, treatment, treatment risks and precautions. Pt prefers to treat via telemedicine empirically rather than in person at this moment.     I discussed the assessment and treatment plan with the patient. The patient was provided an opportunity to ask questions and all were answered. The patient agreed with the plan and demonstrated an understanding of the instructions.    Time spent 20 minutes Delorise Jackson, MD

## 2021-04-18 ENCOUNTER — Other Ambulatory Visit: Payer: Self-pay | Admitting: Internal Medicine

## 2021-04-18 DIAGNOSIS — R059 Cough, unspecified: Secondary | ICD-10-CM

## 2021-04-18 DIAGNOSIS — U071 COVID-19: Secondary | ICD-10-CM

## 2021-04-18 DIAGNOSIS — J9801 Acute bronchospasm: Secondary | ICD-10-CM

## 2021-06-13 ENCOUNTER — Other Ambulatory Visit: Payer: Self-pay

## 2021-06-13 DIAGNOSIS — K219 Gastro-esophageal reflux disease without esophagitis: Secondary | ICD-10-CM

## 2021-06-13 MED ORDER — PANTOPRAZOLE SODIUM 40 MG PO TBEC: 40.0000 mg | DELAYED_RELEASE_TABLET | Freq: Every day | ORAL | 1 refills | Status: DC

## 2021-06-30 ENCOUNTER — Other Ambulatory Visit: Payer: Self-pay | Admitting: Internal Medicine

## 2021-06-30 DIAGNOSIS — I10 Essential (primary) hypertension: Secondary | ICD-10-CM

## 2021-06-30 DIAGNOSIS — R000 Tachycardia, unspecified: Secondary | ICD-10-CM

## 2021-08-07 ENCOUNTER — Encounter: Payer: Self-pay | Admitting: Gastroenterology

## 2021-08-13 DIAGNOSIS — I10 Essential (primary) hypertension: Secondary | ICD-10-CM | POA: Diagnosis not present

## 2021-08-13 DIAGNOSIS — E785 Hyperlipidemia, unspecified: Secondary | ICD-10-CM | POA: Diagnosis not present

## 2021-08-13 DIAGNOSIS — J309 Allergic rhinitis, unspecified: Secondary | ICD-10-CM | POA: Diagnosis not present

## 2021-08-13 DIAGNOSIS — E669 Obesity, unspecified: Secondary | ICD-10-CM | POA: Diagnosis not present

## 2021-08-13 DIAGNOSIS — G8929 Other chronic pain: Secondary | ICD-10-CM | POA: Diagnosis not present

## 2021-08-13 DIAGNOSIS — M1990 Unspecified osteoarthritis, unspecified site: Secondary | ICD-10-CM | POA: Diagnosis not present

## 2021-08-13 DIAGNOSIS — K219 Gastro-esophageal reflux disease without esophagitis: Secondary | ICD-10-CM | POA: Diagnosis not present

## 2021-08-13 DIAGNOSIS — G4700 Insomnia, unspecified: Secondary | ICD-10-CM | POA: Diagnosis not present

## 2021-08-13 DIAGNOSIS — Z6834 Body mass index (BMI) 34.0-34.9, adult: Secondary | ICD-10-CM | POA: Diagnosis not present

## 2021-08-15 DIAGNOSIS — Z1231 Encounter for screening mammogram for malignant neoplasm of breast: Secondary | ICD-10-CM | POA: Diagnosis not present

## 2021-08-15 DIAGNOSIS — N958 Other specified menopausal and perimenopausal disorders: Secondary | ICD-10-CM | POA: Diagnosis not present

## 2021-08-15 DIAGNOSIS — R29890 Loss of height: Secondary | ICD-10-CM | POA: Diagnosis not present

## 2021-08-15 DIAGNOSIS — M8588 Other specified disorders of bone density and structure, other site: Secondary | ICD-10-CM | POA: Diagnosis not present

## 2021-08-15 LAB — HM MAMMOGRAPHY

## 2021-08-15 LAB — HM DEXA SCAN: HM Dexa Scan: ABNORMAL

## 2021-08-20 DIAGNOSIS — Z01419 Encounter for gynecological examination (general) (routine) without abnormal findings: Secondary | ICD-10-CM | POA: Diagnosis not present

## 2021-08-20 DIAGNOSIS — Z6834 Body mass index (BMI) 34.0-34.9, adult: Secondary | ICD-10-CM | POA: Diagnosis not present

## 2021-08-21 ENCOUNTER — Other Ambulatory Visit: Payer: Self-pay | Admitting: Obstetrics and Gynecology

## 2021-08-21 DIAGNOSIS — R928 Other abnormal and inconclusive findings on diagnostic imaging of breast: Secondary | ICD-10-CM

## 2021-08-24 ENCOUNTER — Other Ambulatory Visit: Payer: Self-pay

## 2021-08-24 ENCOUNTER — Ambulatory Visit
Admission: RE | Admit: 2021-08-24 | Discharge: 2021-08-24 | Disposition: A | Discharge status: Home / Self Care | Payer: Medicare PPO | Source: Ambulatory Visit | Attending: Obstetrics and Gynecology | Admitting: Obstetrics and Gynecology

## 2021-08-24 DIAGNOSIS — R928 Other abnormal and inconclusive findings on diagnostic imaging of breast: Secondary | ICD-10-CM

## 2021-08-24 DIAGNOSIS — R922 Inconclusive mammogram: Secondary | ICD-10-CM | POA: Diagnosis not present

## 2021-08-24 DIAGNOSIS — R921 Mammographic calcification found on diagnostic imaging of breast: Secondary | ICD-10-CM | POA: Diagnosis not present

## 2021-09-07 ENCOUNTER — Other Ambulatory Visit (INDEPENDENT_AMBULATORY_CARE_PROVIDER_SITE_OTHER): Payer: Medicare PPO

## 2021-09-07 ENCOUNTER — Other Ambulatory Visit: Payer: Self-pay

## 2021-09-07 DIAGNOSIS — Z1329 Encounter for screening for other suspected endocrine disorder: Secondary | ICD-10-CM

## 2021-09-07 DIAGNOSIS — R000 Tachycardia, unspecified: Secondary | ICD-10-CM | POA: Diagnosis not present

## 2021-09-07 DIAGNOSIS — E559 Vitamin D deficiency, unspecified: Secondary | ICD-10-CM | POA: Diagnosis not present

## 2021-09-07 DIAGNOSIS — Z1389 Encounter for screening for other disorder: Secondary | ICD-10-CM | POA: Diagnosis not present

## 2021-09-07 DIAGNOSIS — E785 Hyperlipidemia, unspecified: Secondary | ICD-10-CM | POA: Diagnosis not present

## 2021-09-07 LAB — COMPREHENSIVE METABOLIC PANEL
ALT: 11 U/L (ref 0–35)
AST: 14 U/L (ref 0–37)
Albumin: 3.8 g/dL (ref 3.5–5.2)
Alkaline Phosphatase: 54 U/L (ref 39–117)
BUN: 12 mg/dL (ref 6–23)
CO2: 26 mEq/L (ref 19–32)
Calcium: 8.8 mg/dL (ref 8.4–10.5)
Chloride: 105 mEq/L (ref 96–112)
Creatinine, Ser: 0.72 mg/dL (ref 0.40–1.20)
GFR: 80.9 mL/min (ref >60.00)
Glucose, Bld: 89 mg/dL (ref 70–99)
Potassium: 4.3 mEq/L (ref 3.5–5.1)
Sodium: 139 mEq/L (ref 135–145)
Total Bilirubin: 1.6 mg/dL — ABNORMAL HIGH (ref 0.2–1.2)
Total Protein: 6.3 g/dL (ref 6.0–8.3)

## 2021-09-07 LAB — CBC WITH DIFFERENTIAL/PLATELET
Basophils Absolute: 0 10*3/uL (ref 0.0–0.1)
Basophils Relative: 0.3 % (ref 0.0–3.0)
Eosinophils Absolute: 0.1 10*3/uL (ref 0.0–0.7)
Eosinophils Relative: 3 % (ref 0.0–5.0)
HCT: 37.8 % (ref 36.0–46.0)
Hemoglobin: 12.9 g/dL (ref 12.0–15.0)
Lymphocytes Relative: 29.6 % (ref 12.0–46.0)
Lymphs Abs: 1.2 10*3/uL (ref 0.7–4.0)
MCHC: 34.2 g/dL (ref 30.0–36.0)
MCV: 91.7 fl (ref 78.0–100.0)
Monocytes Absolute: 0.3 10*3/uL (ref 0.1–1.0)
Monocytes Relative: 8.5 % (ref 3.0–12.0)
Neutro Abs: 2.4 10*3/uL (ref 1.4–7.7)
Neutrophils Relative %: 58.6 % (ref 43.0–77.0)
Platelets: 173 10*3/uL (ref 150.0–400.0)
RBC: 4.12 Mil/uL (ref 3.87–5.11)
RDW: 12.1 % (ref 11.5–15.5)
WBC: 4.1 10*3/uL (ref 4.0–10.5)

## 2021-09-07 LAB — VITAMIN D 25 HYDROXY (VIT D DEFICIENCY, FRACTURES): VITD: 35.99 ng/mL (ref 30.00–100.00)

## 2021-09-07 LAB — LIPID PANEL
Cholesterol: 139 mg/dL (ref 0–200)
HDL: 49.6 mg/dL (ref >39.00)
LDL Cholesterol: 68 mg/dL (ref 0–99)
NonHDL: 89.07
Total CHOL/HDL Ratio: 3
Triglycerides: 103 mg/dL (ref 0.0–149.0)
VLDL: 20.6 mg/dL (ref 0.0–40.0)

## 2021-09-07 LAB — TSH: TSH: 3.74 u[IU]/mL (ref 0.35–5.50)

## 2021-09-08 LAB — URINALYSIS, ROUTINE W REFLEX MICROSCOPIC
Bacteria, UA: NONE SEEN /HPF
Bilirubin Urine: NEGATIVE
Glucose, UA: NEGATIVE
Hgb urine dipstick: NEGATIVE
Hyaline Cast: NONE SEEN /LPF
Ketones, ur: NEGATIVE
Nitrite: NEGATIVE
Protein, ur: NEGATIVE
RBC / HPF: NONE SEEN /HPF (ref 0–2)
Specific Gravity, Urine: 1.016 (ref 1.001–1.035)
pH: 6 (ref 5.0–8.0)

## 2021-09-10 ENCOUNTER — Encounter: Payer: Self-pay | Admitting: Internal Medicine

## 2021-09-10 DIAGNOSIS — M8580 Other specified disorders of bone density and structure, unspecified site: Secondary | ICD-10-CM

## 2021-09-11 ENCOUNTER — Encounter: Payer: Self-pay | Admitting: Internal Medicine

## 2021-09-11 ENCOUNTER — Other Ambulatory Visit: Payer: Self-pay

## 2021-09-11 ENCOUNTER — Ambulatory Visit: Payer: Medicare PPO | Admitting: Internal Medicine

## 2021-09-11 VITALS — BP 132/82 | HR 61 | Temp 97.5°F | Ht 61.0 in | Wt 179.6 lb

## 2021-09-11 DIAGNOSIS — Z1211 Encounter for screening for malignant neoplasm of colon: Secondary | ICD-10-CM | POA: Diagnosis not present

## 2021-09-11 DIAGNOSIS — K449 Diaphragmatic hernia without obstruction or gangrene: Secondary | ICD-10-CM | POA: Diagnosis not present

## 2021-09-11 DIAGNOSIS — K219 Gastro-esophageal reflux disease without esophagitis: Secondary | ICD-10-CM | POA: Diagnosis not present

## 2021-09-11 DIAGNOSIS — E785 Hyperlipidemia, unspecified: Secondary | ICD-10-CM | POA: Diagnosis not present

## 2021-09-11 DIAGNOSIS — Z0000 Encounter for general adult medical examination without abnormal findings: Secondary | ICD-10-CM | POA: Diagnosis not present

## 2021-09-11 MED ORDER — PANTOPRAZOLE SODIUM 40 MG PO TBEC: 40.0000 mg | DELAYED_RELEASE_TABLET | Freq: Every day | ORAL | 3 refills | Status: DC

## 2021-09-11 MED ORDER — SIMVASTATIN 40 MG PO TABS: 40.0000 mg | ORAL_TABLET | Freq: Every day | ORAL | 3 refills | Status: DC

## 2021-09-11 MED ORDER — EZETIMIBE 10 MG PO TABS: ORAL_TABLET | ORAL | 3 refills | Status: DC

## 2021-09-11 NOTE — Progress Notes (Signed)
Chief Complaint  Patient presents with   Follow-up   Annual  1. Htn controlled and ST controlled on BB metoprolol 12.5 mg bid  2. C/o cough worse qhs with trigger foods chocolate, fried foods on protonix 40 mg she also has allergies takes claritin  Moderate HH +  Review of Systems  Constitutional:  Negative for weight loss.  HENT:  Negative for hearing loss.   Eyes:  Negative for blurred vision.  Respiratory:  Negative for shortness of breath.   Cardiovascular:  Negative for chest pain.  Gastrointestinal:  Positive for heartburn. Negative for abdominal pain and blood in stool.  Genitourinary:  Negative for dysuria.  Musculoskeletal:  Negative for falls and joint pain.  Skin:  Negative for rash.  Neurological:  Negative for headaches.  Psychiatric/Behavioral:  Negative for depression.   Past Medical History:  Diagnosis Date   Anxiety    Basal cell carcinoma 03/2019   right upper forehead, MOHS   Basal cell carcinoma 10/2012   Left upper back   COVID-19    04/11/21 tx'ed paxlovid on call Dr. Scarlette Calico   Fatty liver 2010   GERD (gastroesophageal reflux disease)    H. pylori infection    Hemangioma 2010   Hiatal hernia    Hyperlipidemia    Liver hemangioma    Past Surgical History:  Procedure Laterality Date   bilateral lens replacement     CHOLECYSTECTOMY     KNEE SURGERY  06/29/2007   Meniscus tear, left knee surgery   Family History  Problem Relation Age of Onset   Hypertension Sister    Skin cancer Sister    Hypertension Brother    Hypertension Sister    Heart attack Sister        56 y.o as of 09/10/19    Anxiety disorder Sister    Colon cancer Neg Hx    Social History   Socioeconomic History   Marital status: Widowed    Spouse name: Not on file   Number of children: 2   Years of education: Not on file   Highest education level: Not on file  Occupational History   Occupation: Retired Garment/textile technologist  Tobacco Use   Smoking status:  Never   Smokeless tobacco: Never  Vaping Use   Vaping Use: Never used  Substance and Sexual Activity   Alcohol use: No   Drug use: No   Sexual activity: Not Currently    Birth control/protection: None  Other Topics Concern   Not on file  Social History Narrative   Has living will.  Widow as of 04/08/18.  Daughters have a copy of living will and [name] would be HPOA if husband was not able.   1 daughter lives in [place] has 4 kids and 1 daughter in [place] with 2 kids      Would desire CPR.  She would not want heroic measures.            Social Determinants of Health   Financial Resource Strain: Low Risk    Difficulty of Paying Living Expenses: Not hard at all  Food Insecurity: No Food Insecurity   Worried About Charity fundraiser in the Last Year: Never true   [place] in the Last Year: Never true  Transportation Needs: No Transportation Needs   Lack of Transportation (Medical): No   Lack of Transportation (Non-Medical): No  Physical Activity: Not on file  Stress: No Stress Concern Present  Feeling of Stress : Not at all  Social Connections: Unknown   Frequency of Communication with Friends and Family: More than three times a week   Frequency of Social Gatherings with Friends and Family: More than three times a week   Attends Religious Services: Not on file   Active Member of Clubs or Organizations: Not on file   Attends Archivist Meetings: Not on file   Marital Status: Widowed  Human resources officer Violence: Not At Risk   Fear of Current or Ex-Partner: No   Emotionally Abused: No   Physically Abused: No   Sexually Abused: No   Current Meds  Medication Sig   albuterol (VENTOLIN HFA) 108 (90 Base) MCG/ACT inhaler INHALE 1 TO 2 PUFFS INTO THE LUNGS EVERY 6 HOURS AS NEEDED FOR WHEEZING OR SHORTNESS OF BREATH OR COUGH   Ascorbic Acid (VITAMIN C) 500 MG tablet Take 500 mg by mouth daily.   aspirin EC 81 MG tablet Take 81 mg by mouth daily.    azelastine (ASTELIN) 0.1 % nasal spray Place 2 sprays into both nostrils as needed. Use in each nostril as directed   Calcium Carbonate-Vitamin D (CALCIUM 600/VITAMIN D PO) Take 1 tablet by mouth 2 (two) times daily.   Diclofenac Sodium (VOLTAREN EX) Apply topically as needed.   fish oil-omega-3 fatty acids 1000 MG capsule Take 2 g by mouth daily.   loratadine (CLARITIN) 10 MG tablet Take 10 mg by mouth daily as needed for allergies.   melatonin 5 MG TABS Take 5 mg by mouth at bedtime as needed.   metoprolol tartrate (LOPRESSOR) 25 MG tablet TAKE 1/2 TABLET BY MOUTH TWICE DAILY   vitamin E 400 UNIT capsule Take 400 Units by mouth daily.   [DISCONTINUED] albuterol (VENTOLIN HFA) 108 (90 Base) MCG/ACT inhaler INHALE 1 TO 2 PUFFS INTO THE LUNGS EVERY 6 HOURS AS NEEDED FOR WHEEZING OR SHORTNESS OF BREATH OR COUGH   [DISCONTINUED] ezetimibe (ZETIA) 10 MG tablet TAKE 1 TABLET(10 MG) BY MOUTH DAILY   [DISCONTINUED] pantoprazole (PROTONIX) 40 MG tablet Take 1 tablet (40 mg total) by mouth daily. 30 min before food   [DISCONTINUED] simvastatin (ZOCOR) 40 MG tablet Take 1 tablet (40 mg total) by mouth daily.   Allergies  Allergen Reactions   Dexilant [Dexlansoprazole] Diarrhea   Sulfonamide Derivatives     REACTION: as child   Recent Results (from the past 2160 hour(s))  HM DEXA SCAN     Status: None   Collection Time: 08/15/21 12:00 AM  Result Value Ref Range   HM Dexa Scan abnormal     Comment: spine nl, -1.8 left neck, 01.9 right neck osteopenia PFW  HM MAMMOGRAPHY     Status: None   Collection Time: 08/24/21 12:00 AM  Result Value Ref Range   HM Mammogram 0-4 Bi-Rad 0-4 Bi-Rad, Self Reported Normal    Comment: normal the center [place] 08/24/21  Vitamin D (25 hydroxy)     Status: None   Collection Time: 09/07/21  9:06 AM  Result Value Ref Range   VITD 35.99 30.00 - 100.00 ng/mL  TSH     Status: None   Collection Time: 09/07/21  9:06 AM  Result Value Ref Range   TSH 3.74 0.35 - 5.50 uIU/mL   CBC w/Diff     Status: None   Collection Time: 09/07/21  9:06 AM  Result Value Ref Range   WBC 4.1 4.0 - 10.5 K/uL   RBC 4.12 3.87 - 5.11 Mil/uL  Hemoglobin 12.9 12.0 - 15.0 g/dL   HCT 37.8 36.0 - 46.0 %   MCV 91.7 78.0 - 100.0 fl   MCHC 34.2 30.0 - 36.0 g/dL   RDW 12.1 11.5 - 15.5 %   Platelets 173.0 150.0 - 400.0 K/uL   Neutrophils Relative % 58.6 43.0 - 77.0 %   Lymphocytes Relative 29.6 12.0 - 46.0 %   Monocytes Relative 8.5 3.0 - 12.0 %   Eosinophils Relative 3.0 0.0 - 5.0 %   Basophils Relative 0.3 0.0 - 3.0 %   Neutro Abs 2.4 1.4 - 7.7 K/uL   Lymphs Abs 1.2 0.7 - 4.0 K/uL   Monocytes Absolute 0.3 0.1 - 1.0 K/uL   Eosinophils Absolute 0.1 0.0 - 0.7 K/uL   Basophils Absolute 0.0 0.0 - 0.1 K/uL  Lipid panel     Status: None   Collection Time: 09/07/21  9:06 AM  Result Value Ref Range   Cholesterol 139 0 - 200 mg/dL    Comment: ATP III Classification       Desirable:  < 200 mg/dL               Borderline High:  200 - 239 mg/dL          High:  > = 240 mg/dL   Triglycerides 103.0 0.0 - 149.0 mg/dL    Comment: Normal:  <150 mg/dLBorderline High:  150 - 199 mg/dL   HDL 49.60 >39.00 mg/dL   VLDL 20.6 0.0 - 40.0 mg/dL   LDL Cholesterol 68 0 - 99 mg/dL   Total CHOL/HDL Ratio 3     Comment:                Men          Women1/2 Average Risk     3.4          3.3Average Risk          5.0          4.42X Average Risk          9.6          7.13X Average Risk          15.0          11.0                       NonHDL 89.07     Comment: NOTE:  Non-HDL goal should be 30 mg/dL higher than patient's LDL goal (i.e. LDL goal of < 70 mg/dL, would have non-HDL goal of < 100 mg/dL)  Comprehensive metabolic panel     Status: Abnormal   Collection Time: 09/07/21  9:06 AM  Result Value Ref Range   Sodium 139 135 - 145 mEq/L   Potassium 4.3 3.5 - 5.1 mEq/L   Chloride 105 96 - 112 mEq/L   CO2 26 19 - 32 mEq/L   Glucose, Bld 89 70 - 99 mg/dL   BUN 12 6 - 23 mg/dL   Creatinine, Ser 0.72 0.40 - 1.20  mg/dL   Total Bilirubin 1.6 (H) 0.2 - 1.2 mg/dL   Alkaline Phosphatase 54 39 - 117 U/L   AST 14 0 - 37 U/L   ALT 11 0 - 35 U/L   Total Protein 6.3 6.0 - 8.3 g/dL   Albumin 3.8 3.5 - 5.2 g/dL   GFR 80.90 >60.00 mL/min    Comment: Calculated using the CKD-EPI Creatinine Equation (2021)   Calcium 8.8 8.4 - 10.5  mg/dL  Urinalysis, Routine w reflex microscopic     Status: Abnormal   Collection Time: 09/07/21  9:07 AM  Result Value Ref Range   Color, Urine YELLOW YELLOW   APPearance CLEAR CLEAR   Specific Gravity, Urine 1.016 1.001 - 1.035   pH 6.0 5.0 - 8.0   Glucose, UA NEGATIVE NEGATIVE   Bilirubin Urine NEGATIVE NEGATIVE   Ketones, ur NEGATIVE NEGATIVE   Hgb urine dipstick NEGATIVE NEGATIVE   Protein, ur NEGATIVE NEGATIVE   Nitrite NEGATIVE NEGATIVE   Leukocytes,Ua 1+ (A) NEGATIVE   WBC, UA 0-5 0 - 5 /HPF   RBC / HPF NONE SEEN 0 - 2 /HPF   Squamous Epithelial / LPF 0-5 < OR = 5 /HPF   Bacteria, UA NONE SEEN NONE SEEN /HPF   Hyaline Cast NONE SEEN NONE SEEN /LPF   Objective  Body mass index is 33.94 kg/m. Wt Readings from Last 3 Encounters:  09/11/21 179 lb 9.6 oz (81.5 kg)  04/13/21 170 lb (77.1 kg)  11/30/20 184 lb (83.5 kg)   Temp Readings from Last 3 Encounters:  09/11/21 (!) 97.5 F (36.4 C) (Temporal)  11/07/20 (!) 97.1 F (36.2 C) (Oral)  09/27/20 (!) 97.5 F (36.4 C) (Oral)   BP Readings from Last 3 Encounters:  09/11/21 132/82  04/13/21 122/78  11/07/20 140/73   Pulse Readings from Last 3 Encounters:  09/11/21 61  11/07/20 62  09/27/20 74    Physical Exam Vitals and nursing note reviewed.  Constitutional:      Appearance: Normal appearance. She is well-developed and well-groomed.  HENT:     Head: Normocephalic and atraumatic.  Eyes:     Conjunctiva/sclera: Conjunctivae normal.     Pupils: Pupils are equal, round, and reactive to light.  Cardiovascular:     Rate and Rhythm: Normal rate and regular rhythm.     Heart sounds: Normal heart sounds.  No murmur heard. Pulmonary:     Effort: Pulmonary effort is normal.     Breath sounds: Normal breath sounds.  Abdominal:     General: Abdomen is flat. Bowel sounds are normal.     Tenderness: There is no abdominal tenderness.  Musculoskeletal:        General: No tenderness.  Skin:    General: Skin is warm and dry.  Neurological:     General: No focal deficit present.     Mental Status: She is alert and oriented to person, place, and time. Mental status is at baseline.     Cranial Nerves: Cranial nerves 2-12 are intact.     Gait: Gait is intact.  Psychiatric:        Attention and Perception: Attention and perception normal.        Mood and Affect: Mood and affect normal.        Speech: Speech normal.        Behavior: Behavior normal. Behavior is cooperative.        Thought Content: Thought content normal.        Cognition and Memory: Cognition and memory normal.        Judgment: Judgment normal.    Assessment  Plan  Annual physical exam Flu shot utd  tdap 03/22/20 shingrix utd prevnar utd pna 23 utd utd covid 3/3 pfizer consider booster   mammo normal 08/24/21 pfw  dexa 07/01/2019 osteopenia, 08/15/21 abnormal P[facility] spine nl, -1.8 left neck, 01.9 right neck osteopenia [facility]  Colonoscopy normal 07/25/11 Dr. Deatra Ina f/u in 10 years  Referred [facility]  Out of age window pap   Of note sister patsy lough also a pt    Dr. Pryor Ochoa ENT  [facility] 07/01/2019 My eye doctor   Screening for colon cancer - Plan: Ambulatory referral to Gastroenterology Dr. Modena Nunnery   Hyperlipidemia, unspecified hyperlipidemia type - Plan: ezetimibe (ZETIA) 10 MG tablet Zocor 40 mg qd   Gastroesophageal reflux disease without esophagitis - Plan: pantoprazole (PROTONIX) 40 MG tablet Hiatal hernia  Cough could be 2/2 above vs allergies takes  claritin   Provider: Dr. [name] McLean-Scocuzza-Internal Medicine

## 2021-09-11 NOTE — Patient Instructions (Addendum)
Think about about 4th covid 19 vaccine  Call and schedule eye exam Call and schedule colonoscopy  [phone] Not available [place] [id number]      Specialties     Gastroenterology           Consider pepcid 20 mg at night over the counter as well   Gastroesophageal Reflux Disease, Adult Gastroesophageal reflux (GER) happens when acid from the stomach flows up into the tube that connects the mouth and the stomach (esophagus). Normally, food travels down the esophagus and stays in the stomach to be digested. However, when a person has GER, food and stomach acid sometimes move back up into the esophagus. If this becomes a more serious problem, the person may be diagnosed with a disease called gastroesophageal reflux disease (GERD). GERD occurs when the reflux: Happens often. Causes frequent or severe symptoms. Causes problems such as damage to the esophagus. When stomach acid comes in contact with the esophagus, the acid may cause inflammation in the esophagus. Over time, GERD may create small holes (ulcers) in the lining of the esophagus. What are the causes? This condition is caused by a problem with the muscle between the esophagus and the stomach (lower esophageal sphincter, or LES). Normally, the LES muscle closes after food passes through the esophagus to the stomach. When the LES is weakened or abnormal, it does not close properly, and that allows food and stomach acid to go back up into the esophagus. The LES can be weakened by certain dietary substances, medicines, and medical conditions, including: Tobacco use. Pregnancy. Having a hiatal hernia. Alcohol use. Certain foods and beverages, such as coffee, chocolate, onions, and peppermint. What increases the risk? You are more likely to develop this condition if you: Have an increased body weight. Have a connective tissue disorder. Take NSAIDs, such as ibuprofen. What are the signs or  symptoms? Symptoms of this condition include: Heartburn. Difficult or painful swallowing and the feeling of having a lump in the throat. A bitter taste in the mouth. Bad breath and having a large amount of saliva. Having an upset or bloated stomach and belching. Chest pain. Different conditions can cause chest pain. Make sure you see your health care provider if you experience chest pain. Shortness of breath or wheezing. Ongoing (chronic) cough or a nighttime cough. Wearing away of tooth enamel. Weight loss. How is this diagnosed? This condition may be diagnosed based on a medical history and a physical exam. To determine if you have mild or severe GERD, your health care provider may also monitor how you respond to treatment. You may also have tests, including: A test to examine your stomach and esophagus with a small camera (endoscopy). A test that measures the acidity level in your esophagus. A test that measures how much pressure is on your esophagus. A barium swallow or modified barium swallow test to show the shape, size, and functioning of your esophagus. How is this treated? Treatment for this condition may vary depending on how severe your symptoms are. Your health care provider may recommend: Changes to your diet. Medicine. Surgery. The goal of treatment is to help relieve your symptoms and to prevent complications. Follow these instructions at home: Eating and drinking  Follow a diet as recommended by your health care provider. This may involve avoiding foods and drinks such as: Coffee and tea, with or without caffeine. Drinks that contain alcohol. Energy drinks and sports drinks. Carbonated drinks or sodas. Chocolate  and cocoa. Peppermint and mint flavorings. Garlic and onions. Horseradish. Spicy and acidic foods, including peppers, chili powder, curry powder, vinegar, hot sauces, and barbecue sauce. Citrus fruit juices and citrus fruits, such as oranges, lemons, and  limes. Tomato-based foods, such as red sauce, chili, salsa, and pizza with red sauce. Fried and fatty foods, such as donuts, french fries, potato chips, and high-fat dressings. High-fat meats, such as hot dogs and fatty cuts of red and white meats, such as rib eye steak, sausage, ham, and bacon. High-fat dairy items, such as whole milk, butter, and cream cheese. Eat small, frequent meals instead of large meals. Avoid drinking large amounts of liquid with your meals. Avoid eating meals during the 2-3 hours before bedtime. Avoid lying down right after you eat. Do not exercise right after you eat. Lifestyle  Do not use any products that contain nicotine or tobacco. These products include cigarettes, chewing tobacco, and vaping devices, such as e-cigarettes. If you need help quitting, ask your health care provider. Try to reduce your stress by using methods such as yoga or meditation. If you need help reducing stress, ask your health care provider. If you are overweight, reduce your weight to an amount that is healthy for you. Ask your health care provider for guidance about a safe weight loss goal. General instructions Pay attention to any changes in your symptoms. Take over-the-counter and prescription medicines only as told by your health care provider. Do not take aspirin, ibuprofen, or other NSAIDs unless your health care provider told you to take these medicines. Wear loose-fitting clothing. Do not wear anything tight around your waist that causes pressure on your abdomen. Raise (elevate) the head of your bed about 6 inches (15 cm). You can use a wedge to do this. Avoid bending over if this makes your symptoms worse. Keep all follow-up visits. This is important. Contact a health care provider if: You have: New symptoms. Unexplained weight loss. Difficulty swallowing or it hurts to swallow. Wheezing or a persistent cough. A hoarse voice. Your symptoms do not improve with treatment. Get  help right away if: You have sudden pain in your arms, neck, jaw, teeth, or back. You suddenly feel sweaty, dizzy, or light-headed. You have chest pain or shortness of breath. You vomit and the vomit is green, yellow, or black, or it looks like blood or coffee grounds. You faint. You have stool that is red, bloody, or black. You cannot swallow, drink, or eat. These symptoms may represent a serious problem that is an emergency. Do not wait to see if the symptoms will go away. Get medical help right away. Call your local emergency services (911 in the [place] Do not drive yourself to the hospital. Summary Gastroesophageal reflux happens when acid from the stomach flows up into the esophagus. GERD is a disease in which the reflux happens often, causes frequent or severe symptoms, or causes problems such as damage to the esophagus. Treatment for this condition may vary depending on how severe your symptoms are. Your health care provider may recommend diet and lifestyle changes, medicine, or surgery. Contact a health care provider if you have new or worsening symptoms. Take over-the-counter and prescription medicines only as told by your health care provider. Do not take aspirin, ibuprofen, or other NSAIDs unless your health care provider told you to do so. Keep all follow-up visits as told by your health care provider. This is important. This information is not intended to replace advice given to you by  your health care provider. Make sure you discuss any questions you have with your health care provider. Document Revised: 04/24/2020 Document Reviewed: 04/24/2020 Elsevier Patient Education  2022 [place] for Gastroesophageal Reflux Disease, Adult When you have gastroesophageal reflux disease (GERD), the foods you eat and your eating habits are very important. Choosing the right foods can help ease the discomfort of GERD. Consider working with a dietitian to help you make healthy  food choices. What are tips for following this plan? Reading food labels Look for foods that are low in saturated fat. Foods that have less than 5% of daily value (DV) of fat and 0 g of trans fats may help with your symptoms. Cooking Cook foods using methods other than frying. This may include baking, steaming, grilling, or broiling. These are all methods that do not need a lot of fat for cooking. To add flavor, try to use herbs that are low in spice and acidity. Meal planning  Choose healthy foods that are low in fat, such as fruits, vegetables, whole grains, low-fat dairy products, lean meats, fish, and poultry. Eat frequent, small meals instead of three large meals each day. Eat your meals slowly, in a relaxed setting. Avoid bending over or lying down until 2-3 hours after eating. Limit high-fat foods such as fatty meats or fried foods. Limit your intake of fatty foods, such as oils, butter, and shortening. Avoid the following as told by your health care provider: Foods that cause symptoms. These may be different for different people. Keep a food diary to keep track of foods that cause symptoms. Alcohol. Drinking large amounts of liquid with meals. Eating meals during the 2-3 hours before bed. Lifestyle Maintain a healthy weight. Ask your health care provider what weight is healthy for you. If you need to lose weight, work with your health care provider to do so safely. Exercise for at least 30 minutes on 5 or more days each week, or as told by your health care provider. Avoid wearing clothes that fit tightly around your waist and chest. Do not use any products that contain nicotine or tobacco. These products include cigarettes, chewing tobacco, and vaping devices, such as e-cigarettes. If you need help quitting, ask your health care provider. Sleep with the head of your bed raised. Use a wedge under the mattress or blocks under the bed frame to raise the head of the bed. Chew sugar-free  gum after mealtimes. What foods should I eat? Eat a healthy, well-balanced diet of fruits, vegetables, whole grains, low-fat dairy products, lean meats, fish, and poultry. Each person is different. Foods that may trigger symptoms in one person may not trigger any symptoms in another person. Work with your health care provider to identify foods that are safe for you. The items listed above may not be a complete list of recommended foods and beverages. Contact a dietitian for more information. What foods should I avoid? Limiting some of these foods may help manage the symptoms of GERD. Everyone is different. Consult a dietitian or your health care provider to help you identify the exact foods to avoid, if any. Fruits Any fruits prepared with added fat. Any fruits that cause symptoms. For some people this may include citrus fruits, such as oranges, grapefruit, pineapple, and lemons. Vegetables Deep-fried vegetables. [place] fries. Any vegetables prepared with added fat. Any vegetables that cause symptoms. For some people, this may include tomatoes and tomato products, chili peppers, onions and garlic, and horseradish. Grains Pastries  or quick breads with added fat. Meats and other proteins High-fat meats, such as fatty beef or pork, hot dogs, ribs, ham, sausage, salami, and bacon. Fried meat or protein, including fried fish and fried chicken. Nuts and nut butters, in large amounts. Dairy Whole milk and chocolate milk. Sour cream. Cream. Ice cream. Cream cheese. Milkshakes. Fats and oils Butter. Margarine. Shortening. Ghee. Beverages Coffee and tea, with or without caffeine. Carbonated beverages. Sodas. Energy drinks. Fruit juice made with acidic fruits, such as orange or grapefruit. Tomato juice. Alcoholic drinks. Sweets and desserts Chocolate and cocoa. Donuts. Seasonings and condiments Pepper. Peppermint and spearmint. Added salt. Any condiments, herbs, or seasonings that cause symptoms. For  some people, this may include curry, hot sauce, or vinegar-based salad dressings. The items listed above may not be a complete list of foods and beverages to avoid. Contact a dietitian for more information. Questions to ask your health care provider Diet and lifestyle changes are usually the first steps that are taken to manage symptoms of GERD. If diet and lifestyle changes do not improve your symptoms, talk with your health care provider about taking medicines. Where to find more information International Foundation for Gastrointestinal Disorders: aboutgerd.org Summary When you have gastroesophageal reflux disease (GERD), food and lifestyle choices may be very helpful in easing the discomfort of GERD. Eat frequent, small meals instead of three large meals each day. Eat your meals slowly, in a relaxed setting. Avoid bending over or lying down until 2-3 hours after eating. Limit high-fat foods such as fatty meats or fried foods. This information is not intended to replace advice given to you by your health care provider. Make sure you discuss any questions you have with your health care provider. Document Revised: 04/24/2020 Document Reviewed: 04/24/2020 Elsevier Patient Education  [place].

## 2021-09-13 ENCOUNTER — Other Ambulatory Visit: Payer: Self-pay | Admitting: Internal Medicine

## 2021-09-13 ENCOUNTER — Encounter: Payer: Self-pay | Admitting: Gastroenterology

## 2021-09-27 ENCOUNTER — Other Ambulatory Visit: Payer: Self-pay

## 2021-09-27 ENCOUNTER — Telehealth: Payer: Self-pay | Admitting: Internal Medicine

## 2021-09-27 ENCOUNTER — Ambulatory Visit
Admission: EM | Admit: 2021-09-27 | Discharge: 2021-09-27 | Disposition: A | Discharge status: Home / Self Care | Payer: Medicare PPO | Attending: Family Medicine | Admitting: Family Medicine

## 2021-09-27 ENCOUNTER — Encounter: Payer: Self-pay | Admitting: Emergency Medicine

## 2021-09-27 DIAGNOSIS — J111 Influenza due to unidentified influenza virus with other respiratory manifestations: Secondary | ICD-10-CM

## 2021-09-27 DIAGNOSIS — Z1152 Encounter for screening for COVID-19: Secondary | ICD-10-CM | POA: Diagnosis not present

## 2021-09-27 MED ORDER — OSELTAMIVIR PHOSPHATE 75 MG PO CAPS: 75.0000 mg | ORAL_CAPSULE | Freq: Two times a day (BID) | ORAL | 0 refills | Status: DC

## 2021-09-27 MED ORDER — BENZONATATE 200 MG PO CAPS: 200.0000 mg | ORAL_CAPSULE | Freq: Three times a day (TID) | ORAL | 0 refills | Status: DC | PRN

## 2021-09-27 MED ORDER — PREDNISONE 20 MG PO TABS: 40.0000 mg | ORAL_TABLET | Freq: Every day | ORAL | 0 refills | Status: DC

## 2021-09-27 NOTE — Telephone Encounter (Signed)
Patient thinks she may have the flu. She has a low grade fever, chills, ears hurt, sore throat,  headach, cough which is the worst. At time of call no appointments today or tomorrow. Patient transferred to Access Nurse.

## 2021-09-27 NOTE — ED Provider Notes (Signed)
[facility] Mel[facility]    CSN: [id number] Arrival date & time: 09/27/21  1503      History   Chief Complaint Chief Complaint  Patient presents with   Cough   Headache   Generalized Body Aches    HPI Leah Melendez is a 75 y.o. female.   HPI Patient presents today for evaluation of cough, body aches headache, runny nose sore throat and fever.  Symptoms present x1 day.  Unknown if exposure to COVID or flu.  Denies any worrisome symptoms of shortness of breath, wheezing.  Patient has no chronic underlying respiratory disease  Past Medical History:  Diagnosis Date   Anxiety    Basal cell carcinoma 03/2019   right upper forehead, MOHS   Basal cell carcinoma 10/2012   Left upper back   COVID-19    04/11/21 tx'ed paxlovid on call Dr. Scarlette Calico   Fatty liver 2010   GERD (gastroesophageal reflux disease)    H. 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   Abnormal EEG 03/16/2020   Insomnia 03/16/2020   CAD (coronary artery disease) 10/26/2019   Lymphedema 09/10/2019   Sinus tachycardia 09/10/2019   Dizziness 08/19/2019   Abnormal EKG 08/19/2019   Essential hypertension 08/19/2019   Anxiety 07/29/2019   Exertional dyspnea 07/15/2019   Syncope 07/15/2019   Osteopenia 07/01/2019   Vitamin D deficiency 08/17/2018   Osteoarthritis, hand, 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      Home Medications    Prior to Admission medications   Medication Sig Start Date End Date Taking? Authorizing Provider  benzonatate (TESSALON) 200 MG capsule Take 1 capsule (200 mg total) by mouth 3 (three) times daily as needed for cough. 09/27/21  Yes Scot Jun, FNP  oseltamivir (TAMIFLU) 75 MG capsule Take 1 capsule (75 mg total) by mouth 2 (two) times daily. 09/27/21  Yes Scot Jun, FNP  predniSONE (DELTASONE) 20 MG tablet Take 2 tablets (40 mg total) by mouth daily with breakfast. 09/27/21  Yes Scot Jun, FNP  albuterol (VENTOLIN HFA) 108 (90 Base) MCG/ACT inhaler INHALE 1 TO 2 PUFFS INTO THE LUNGS EVERY 6 HOURS AS NEEDED FOR WHEEZING OR SHORTNESS OF BREATH OR COUGH 04/16/21   McLean-Scocuzza, Nino Glow, MD  ALPRAZolam Duanne Moron) 0.25 MG tablet Take 0.5 mg by mouth 2 (two) times daily as needed.  Patient not taking: Reported on 09/11/2021    [provider]  Ascorbic Acid (VITAMIN C) 500 MG tablet Take 500 mg by mouth daily.    [provider]  aspirin EC 81 MG tablet Take 81 mg by mouth daily.  [provider]  azelastine (ASTELIN) 0.1 % nasal spray Place 2 sprays into both nostrils as needed. Use in each nostril as directed    [provider]  Calcium Carbonate-Vitamin D (CALCIUM 600/VITAMIN D PO) Take 1 tablet by mouth 2 (two) times daily.    [provider]  Diclofenac Sodium (VOLTAREN EX) Apply topically as needed.    [provider]  ezetimibe (ZETIA) 10 MG tablet TAKE 1 TABLET(10 MG) BY MOUTH DAILY 09/11/21   McLean-Scocuzza, Nino Glow, MD  fish oil-omega-3 fatty acids 1000 MG capsule Take 2 g by mouth daily.    [provider]  fluticasone (FLONASE) 50 MCG/ACT nasal spray Place into both nostrils  daily. Patient not taking: Reported on 09/11/2021    [provider]  loratadine (CLARITIN) 10 MG tablet Take 10 mg by mouth daily as needed for allergies.    [provider]  melatonin 5 MG TABS Take 5 mg by mouth at bedtime as needed.    [provider]  metoprolol tartrate (LOPRESSOR) 25 MG tablet TAKE 1/2 TABLET BY MOUTH TWICE DAILY 07/03/21   McLean-Scocuzza, Nino Glow, MD  pantoprazole (PROTONIX) 40 MG tablet Take 1 tablet (40 mg total) by mouth daily. 30 min before food 09/11/21   McLean-Scocuzza, Nino Glow, MD  simvastatin (ZOCOR) 40 MG tablet TAKE 1 TABLET(40 MG) BY MOUTH DAILY 09/13/21   McLean-Scocuzza, Nino Glow, MD  vitamin E 400 UNIT capsule Take 400 Units by mouth daily.    [provider]  zinc gluconate 50 MG tablet Take 50 mg by mouth daily. Patient not taking: Reported on 09/11/2021    [provider]    Family History Family History  Problem Relation Age of Onset   Hypertension Sister    Skin cancer Sister    Hypertension Brother    Hypertension Sister    Heart attack Sister        106 y.o as of 09/10/19    Anxiety disorder Sister    Colon cancer Neg Hx     Social History Social History   Tobacco Use   Smoking status: Never   Smokeless tobacco: Never  Vaping Use   Vaping Use: Never used  Substance Use Topics   Alcohol use: No   Drug use: No     Allergies   Dexilant [dexlansoprazole] and Sulfonamide derivatives   Review of Systems Review of Systems Pertinent negatives listed in HPI   Physical Exam Triage Vital Signs ED Triage Vitals  Enc Vitals Group     BP 09/27/21 1630 (!) 159/76     Pulse Rate 09/27/21 1630 81     Resp 09/27/21 1630 18     Temp 09/27/21 1630 99.2 F (37.3 C)     Temp Source 09/27/21 1630 Oral     SpO2 09/27/21 1630 98 %     Weight --      Height --      Head Circumference --      Peak Flow --      Pain Score 09/27/21 1628 0     Pain Loc --      Pain Edu? --      Excl. in [place]? --     No data found.  Updated Vital Signs BP (!) 159/76 (BP Location: Right Arm)   Pulse 81   Temp 99.2 F (37.3 C) (Oral)   Resp 18   SpO2 98%   Visual Acuity Right Eye Distance:   Left Eye Distance:  Bilateral Distance:    Right Eye Near:   Left Eye Near:    Bilateral Near:     Physical Exam  General Appearance:    Alert, cooperative, no distress  HENT:   Normocephalic, ears normal, nares mucosal edema with congestion, rhinorrhea, oropharynx mildly erythematous without exudate  Eyes:    PERRL, conjunctiva/corneas clear, EOM's intact       Lungs:     Clear to auscultation bilaterally, respirations unlabored  Heart:    Regular rate and rhythm  Neurologic:   Awake, alert, oriented x 3. No apparent focal neurological           defect.      UC Treatments / Results  Labs (all labs ordered are listed, but only abnormal results are displayed) Labs Reviewed  COVID-19, FLU A+B NAA    EKG   Radiology No results found.  Procedures Procedures (including critical care time)  Medications Ordered in UC Medications - No data to display  Initial Impression / Assessment and Plan / UC Course  I have reviewed the triage vital signs and the nursing notes.  Pertinent labs & imaging results that were available during my care of the patient were reviewed by me and considered in my medical decision making (see chart for details).  Treating for influenza-like illness. COVID/flu testing pending Continue to alternate Tylenol and ibuprofen for management of fever. Force fluids to maintain hydration. Tamiflu twice daily for the next 5 days to reduce symptoms and course of influenza virus.  If you develop any shortness of breath, wheezing or difficulty breathing go immediately to the nearest emergency department.  Final Clinical Impressions(s) / UC Diagnoses   Final diagnoses:  Encounter for screening for COVID-19  Influenza-like illness     Discharge Instructions      Resume  use of your albuterol inhaler 2 puffs every 6 hours as needed for wheezing or shortness of breath.  Start prednisone 40 mg once daily.  Benzonatate Perles as needed for cough.  Start Tamiflu 1 pill twice daily for 5 days to shorten the course and severity of your influenza symptoms.   Respiratory panel will result within 3 days.     ED Prescriptions     Medication Sig Dispense Auth. Provider   oseltamivir (TAMIFLU) 75 MG capsule Take 1 capsule (75 mg total) by mouth 2 (two) times daily. 10 capsule Scot Jun, FNP   predniSONE (DELTASONE) 20 MG tablet Take 2 tablets (40 mg total) by mouth daily with breakfast. 10 tablet Scot Jun, FNP   benzonatate (TESSALON) 200 MG capsule Take 1 capsule (200 mg total) by mouth 3 (three) times daily as needed for cough. 40 capsule Scot Jun, FNP      PDMP not reviewed this encounter.   Scot Jun, FNP 09/27/21 [id number]

## 2021-09-27 NOTE — Discharge Instructions (Addendum)
Resume use of your albuterol inhaler 2 puffs every 6 hours as needed for wheezing or shortness of breath.  Start prednisone 40 mg once daily.  Benzonatate Perles as needed for cough.  Start Tamiflu 1 pill twice daily for 5 days to shorten the course and severity of your influenza symptoms.   Respiratory panel will result within 3 days.

## 2021-09-27 NOTE — ED Triage Notes (Signed)
Pt c/o cough, bodyaches, HA, runny nose,ST and fever sxs started yesterday.

## 2021-09-28 ENCOUNTER — Telehealth: Payer: Self-pay

## 2021-09-28 LAB — COVID-19, FLU A+B NAA
Influenza A, NAA: NOT DETECTED
Influenza B, NAA: NOT DETECTED
SARS-CoV-2, NAA: NOT DETECTED

## 2021-09-28 NOTE — Telephone Encounter (Signed)
Access nurse sent a fax and patient was seen at the [facility] urgent care for her symptoms.  Zeus Marquis,cma

## 2021-09-28 NOTE — Telephone Encounter (Signed)
I called patient & she was able to go to [facility] yesterday. They did not have any rapid tests, but send put was done. She was prescribed prednisone, tamiflu & tessalon. Patient is feeling better today & using her albuterol inhaler as well. I advised if she became short of breath that she needed to be seen at ED ASAP. Otherwise let us know if she is not feeling better next week.

## 2021-11-02 ENCOUNTER — Other Ambulatory Visit: Payer: Self-pay

## 2021-11-02 ENCOUNTER — Ambulatory Visit (AMBULATORY_SURGERY_CENTER): Payer: Medicare PPO | Admitting: *Deleted

## 2021-11-02 VITALS — Ht 61.0 in | Wt 179.0 lb

## 2021-11-02 DIAGNOSIS — Z1211 Encounter for screening for malignant neoplasm of colon: Secondary | ICD-10-CM

## 2021-11-02 NOTE — Progress Notes (Signed)
Patient's pre-visit was done today over the phone with the patient. Name,DOB and address verified. Patient denies any allergies to Eggs and Soy. Patient denies any problems with anesthesia/sedation. Patient is not taking any diet pills or blood thinners. No home Oxygen. Packet of Prep instructions mailed to patient including a copy of a consent form-pt is aware. Prep instructions sent to pt's MyChart (if activated).Patient understands to call us back with any questions or concerns. Patient is aware of our care-partner policy and Covid-19 safety protocol.  ° °EMMI education assigned to the patient for the procedure, sent to MyChart.  ° °The patient is COVID-19 vaccinated.   °

## 2021-11-09 ENCOUNTER — Encounter: Payer: Self-pay | Admitting: Gastroenterology

## 2021-11-16 ENCOUNTER — Encounter: Payer: Self-pay | Admitting: Gastroenterology

## 2021-11-16 ENCOUNTER — Ambulatory Visit (AMBULATORY_SURGERY_CENTER): Payer: Medicare PPO | Admitting: Gastroenterology

## 2021-11-16 ENCOUNTER — Other Ambulatory Visit: Payer: Self-pay

## 2021-11-16 VITALS — BP 132/56 | HR 70 | Temp 97.9°F | Resp 17 | Ht 61.0 in | Wt 179.0 lb

## 2021-11-16 DIAGNOSIS — I10 Essential (primary) hypertension: Secondary | ICD-10-CM | POA: Diagnosis not present

## 2021-11-16 DIAGNOSIS — Z1211 Encounter for screening for malignant neoplasm of colon: Secondary | ICD-10-CM

## 2021-11-16 DIAGNOSIS — D120 Benign neoplasm of cecum: Secondary | ICD-10-CM

## 2021-11-16 DIAGNOSIS — D123 Benign neoplasm of transverse colon: Secondary | ICD-10-CM | POA: Diagnosis not present

## 2021-11-16 MED ORDER — SODIUM CHLORIDE 0.9 % IV SOLN: 500.0000 mL | Freq: Once | INTRAVENOUS | Status: DC

## 2021-11-16 NOTE — Patient Instructions (Signed)
Handouts given for Polyps.  Continue present medications.  Await Pathology report from Dr. Tarri Glenn.  YOU HAD AN ENDOSCOPIC PROCEDURE TODAY AT [facility]:   Refer to the procedure report that was given to you for any specific questions about what was found during the examination.  If the procedure report does not answer your questions, please call your gastroenterologist to clarify.  If you requested that your care partner not be given the details of your procedure findings, then the procedure report has been included in a sealed envelope for you to review at your convenience later.  YOU SHOULD EXPECT: Some feelings of bloating in the abdomen. Passage of more gas than usual.  Walking can help get rid of the air that was put into your GI tract during the procedure and reduce the bloating. If you had a lower endoscopy (such as a colonoscopy or flexible sigmoidoscopy) you may notice spotting of blood in your stool or on the toilet paper. If you underwent a bowel prep for your procedure, you may not have a normal bowel movement for a few days.  Please Note:  You might notice some irritation and congestion in your nose or some drainage.  This is from the oxygen used during your procedure.  There is no need for concern and it should clear up in a day or so.  SYMPTOMS TO REPORT IMMEDIATELY:  Following lower endoscopy (colonoscopy or flexible sigmoidoscopy):  Excessive amounts of blood in the stool  Significant tenderness or worsening of abdominal pains  Swelling of the abdomen that is new, acute  Fever of 100F or higher  For urgent or emergent issues, a gastroenterologist can be reached at any hour by calling [phone]. Do not use MyChart messaging for urgent concerns.    DIET:  We do recommend a small meal at first, but then you may proceed to your regular diet.  Drink plenty of fluids but you should avoid alcoholic beverages for 24 hours.  ACTIVITY:  You should plan to take it  easy for the rest of today and you should NOT DRIVE or use heavy machinery until tomorrow (because of the sedation medicines used during the test).    FOLLOW UP: Our staff will call the number listed on your records 48-72 hours following your procedure to check on you and address any questions or concerns that you may have regarding the information given to you following your procedure. If we do not reach you, we will leave a message.  We will attempt to reach you two times.  During this call, we will ask if you have developed any symptoms of COVID 19. If you develop any symptoms (ie: fever, flu-like symptoms, shortness of breath, cough etc.) before then, please call [phone].  If you test positive for Covid 19 in the 2 weeks post procedure, please call and report this information to [place].    If any biopsies were taken you will be contacted by phone or by letter within the next 1-3 weeks.  Please call us at [phone] if you have not heard about the biopsies in 3 weeks.    SIGNATURES/CONFIDENTIALITY: You and/or your care partner have signed paperwork which will be entered into your electronic medical record.  These signatures attest to the fact that that the information above on your After Visit Summary has been reviewed and is understood.  Full responsibility of the confidentiality of this discharge information lies with you and/or your care-partner.

## 2021-11-16 NOTE — Progress Notes (Signed)
Called to room to assist during endoscopic procedure.  Patient ID and intended procedure confirmed with present staff. Received instructions for my participation in the procedure from the performing physician.  

## 2021-11-16 NOTE — Progress Notes (Signed)
Pt's states no medical or surgical changes since previsit or office visit. VS by CW. 

## 2021-11-16 NOTE — Progress Notes (Signed)
Referring Provider: McLean-Scocuzza, [name] * Primary Care Physician:  McLean-Scocuzza, Nino Glow, MD  Reason for Procedure:  Colon cancer screening   IMPRESSION:  Need for colon cancer screening Appropriate candidate for monitored anesthesia care  PLAN: Colonoscopy in the [facility] today   HPI: Leah Melendez is a 76 y.o. female presents for screening colonoscopy.  Normal screening colonoscopy 2006.  Normal colonoscopy 2012 performed for diarrhea. Biopsies were normal.  Negative Cologuard 2019.   No baseline GI symptoms.   No known family history of colon cancer or polyps. No family history of uterine/endometrial cancer, pancreatic cancer or gastric/stomach cancer.   Past Medical History:  Diagnosis Date   Anxiety    Basal cell carcinoma 03/2019   right upper forehead, MOHS   Basal cell carcinoma 10/2012   Left upper back   COVID-19    04/11/21 tx'ed paxlovid on call Dr. Scarlette Calico   Fatty liver 2010   GERD (gastroesophageal reflux disease)    H. pylori infection    Hemangioma 2010   Hiatal hernia    Hyperlipidemia    Hypertension    Liver hemangioma     Past Surgical History:  Procedure Laterality Date   bilateral lens replacement     CHOLECYSTECTOMY     COLONOSCOPY  2012   KNEE SURGERY  06/29/2007   Meniscus tear, left knee surgery    Current Outpatient Medications  Medication Sig Dispense Refill   aspirin EC 81 MG tablet Take 81 mg by mouth daily.     ezetimibe (ZETIA) 10 MG tablet TAKE 1 TABLET(10 MG) BY MOUTH DAILY 90 tablet 3   melatonin 5 MG TABS Take 5 mg by mouth at bedtime as needed.     metoprolol tartrate (LOPRESSOR) 25 MG tablet TAKE 1/2 TABLET BY MOUTH TWICE DAILY 90 tablet 3   pantoprazole (PROTONIX) 40 MG tablet Take 1 tablet (40 mg total) by mouth daily. 30 min before food 90 tablet 3   simvastatin (ZOCOR) 40 MG tablet TAKE 1 TABLET(40 MG) BY MOUTH DAILY 90 tablet 3   albuterol (VENTOLIN HFA) 108 (90 Base) MCG/ACT inhaler INHALE 1 TO 2  PUFFS INTO THE LUNGS EVERY 6 HOURS AS NEEDED FOR WHEEZING OR SHORTNESS OF BREATH OR COUGH 54 g 1   ALPRAZolam (XANAX) 0.25 MG tablet Take 0.5 mg by mouth 2 (two) times daily as needed.  (Patient not taking: Reported on 09/11/2021)     Ascorbic Acid (VITAMIN C) 500 MG tablet Take 500 mg by mouth daily.     azelastine (ASTELIN) 0.1 % nasal spray Place 2 sprays into both nostrils as needed. Use in each nostril as directed     Calcium Carbonate-Vitamin D (CALCIUM 600/VITAMIN D PO) Take 1 tablet by mouth 2 (two) times daily.     Diclofenac Sodium (VOLTAREN EX) Apply topically as needed.     fish oil-omega-3 fatty acids 1000 MG capsule Take 2 g by mouth daily.     fluticasone (FLONASE) 50 MCG/ACT nasal spray Place into both nostrils daily.     loratadine (CLARITIN) 10 MG tablet Take 10 mg by mouth daily as needed for allergies.     vitamin E 400 UNIT capsule Take 400 Units by mouth daily.     Current Facility-Administered Medications  Medication Dose Route Frequency Provider Last Rate Last Admin   0.9 %  sodium chloride infusion  500 mL Intravenous Once Thornton Park, MD        Allergies as of 11/16/2021 - Review Complete 11/16/2021  Allergen Reaction Noted   Dexilant [dexlansoprazole] Diarrhea 08/19/2013   Sulfonamide derivatives      Family History  Problem Relation Age of Onset   Hypertension Sister    Skin cancer Sister    Hypertension Sister    Heart attack Sister        41 y.o as of 09/10/19    Anxiety disorder Sister    Hypertension Brother    Colon cancer Neg Hx    Colon polyps Neg Hx    Esophageal cancer Neg Hx    Rectal cancer Neg Hx    Stomach cancer Neg Hx      Physical Exam: General:   Alert,  well-nourished, pleasant and cooperative in NAD Head:  Normocephalic and atraumatic. Eyes:  Sclera clear, no icterus.   Conjunctiva pink. Mouth:  No deformity or lesions.   Neck:  Supple; no masses or thyromegaly. Lungs:  Clear throughout to auscultation.   No  wheezes. Heart:  Regular rate and rhythm; no murmurs. Abdomen:  Soft, non-tender, nondistended, normal bowel sounds, no rebound or guarding.  Msk:  Symmetrical. No boney deformities LAD: No inguinal or umbilical LAD Extremities:  No clubbing or edema. Neurologic:  Alert and  oriented x4;  grossly nonfocal Skin:  No obvious rash or bruise. Psych:  Alert and cooperative. Normal mood and affect.     Studies/Results: No results found.    Zosia Lucchese L. Tarri Glenn, MD, MPH 11/16/2021, 10:21 AM

## 2021-11-16 NOTE — Progress Notes (Signed)
Report to PACU, RN, vss, BBS= Clear.  

## 2021-11-16 NOTE — Op Note (Addendum)
Leah Melendez Patient Name: Leah Melendez Procedure Date: 11/16/2021 10:23 AM MRN: [id number] Endoscopist: Thornton Park MD, MD Age: 76 Referring MD:  Date of Birth: May 29, 1946 Gender: Female Account #: [id number] Procedure:                Colonoscopy Indications:              Screening for colorectal malignant neoplasm                           Normal screening colonoscopy 2006                           Normal colonoscopy 2012 performed to evaluate                            diarrhea. Biopsies were normal.                           Negative Cologuard 2019                           No known family history of colon cancer or polyps Medicines:                Monitored Anesthesia Care Procedure:                Pre-Anesthesia Assessment:                           - Prior to the procedure, a History and Physical                            was performed, and patient medications and                            allergies were reviewed. The patient's tolerance of                            previous anesthesia was also reviewed. The risks                            and benefits of the procedure and the sedation                            options and risks were discussed with the patient.                            All questions were answered, and informed consent                            was obtained. Prior Anticoagulants: The patient has                            taken no previous anticoagulant or antiplatelet                            agents. ASA Grade  Assessment: II - A patient with                            mild systemic disease. After reviewing the risks                            and benefits, the patient was deemed in                            satisfactory condition to undergo the procedure.                           After obtaining informed consent, the colonoscope                            was passed under direct vision. Throughout the                             procedure, the patient's blood pressure, pulse, and                            oxygen saturations were monitored continuously. The                            Olympus CF-HQ190L [id number]) Colonoscope was                            introduced through the anus and advanced to the 3                            cm into the ileum. A second forward view of the                            right colon was performed. The colonoscopy was                            performed without difficulty. The patient tolerated                            the procedure well. The quality of the bowel                            preparation was good. The terminal ileum, ileocecal                            valve, appendiceal orifice, and rectum were                            photographed. Scope In: 10:33:22 AM Scope Out: 10:47:17 AM Scope Withdrawal Time: 0 hours 9 minutes 41 seconds  Total Procedure Duration: 0 hours 13 minutes 55 seconds  Findings:                 The perianal and digital rectal examinations were  normal.                           A 3 mm polyp was found in the hepatic flexure. The                            polyp was sessile. The polyp was removed with a                            cold snare. Resection and retrieval were complete.                            Estimated blood loss was minimal.                           A 2 mm polyp was found in the cecum. The polyp was                            sessile. The polyp was removed with a cold snare.                            Resection and retrieval were complete. Estimated                            blood loss was minimal.                           The exam was otherwise without abnormality on                            direct and retroflexion views. Complications:            No immediate complications. Estimated blood loss:                            Minimal. Estimated Blood Loss:     Estimated blood loss was  minimal. Impression:               - One 3 mm polyp at the hepatic flexure, removed                            with a cold snare. Resected and retrieved.                           - One 2 mm polyp in the cecum, removed with a cold                            snare. Resected and retrieved.                           - The examination was otherwise normal on direct                            and retroflexion views. Recommendation:           -  Patient has a contact number available for                            emergencies. The signs and symptoms of potential                            delayed complications were discussed with the                            patient. Return to normal activities tomorrow.                            Written discharge instructions were provided to the                            patient.                           - Resume previous diet.                           - Continue present medications.                           - Await pathology results.                           - Repeat colonoscopy for colon cancer prevention is                            not routinely recommended over age 30.                           - Emerging evidence supports eating a diet of                            fruits, vegetables, grains, calcium, and yogurt                            while reducing red meat and alcohol may reduce the                            risk of colon cancer.                           - Thank you for allowing me to be involved in your                            colon cancer prevention. Thornton Park MD, MD 11/16/2021 10:51:42 AM This report has been signed electronically.

## 2021-11-20 ENCOUNTER — Telehealth: Payer: Self-pay

## 2021-11-20 NOTE — Telephone Encounter (Signed)
°  Follow up Call-  Call back number 11/16/2021  Post procedure Call Back phone  # [phone]  Permission to leave phone message Yes  Some recent data might be hidden     Patient questions:  Do you have a fever, pain , or abdominal swelling? No. Pain Score  0 *  Have you tolerated food without any problems? Yes.    Have you been able to return to your normal activities? Yes.    Do you have any questions about your discharge instructions: Diet   No. Medications  No. Follow up visit  No.  Do you have questions or concerns about your Care? No.  Actions: * If pain score is 4 or above: No action needed, pain <4.  Have you developed a fever since your procedure? no  2.   Have you had an respiratory symptoms (SOB or cough) since your procedure? no  3.   Have you tested positive for COVID 19 since your procedure no  4.   Have you had any family members/close contacts diagnosed with the COVID 19 since your procedure?  no   If yes to any of these questions please route to Joylene John, RN and Joella Prince, RN

## 2021-11-23 ENCOUNTER — Encounter: Payer: Self-pay | Admitting: Gastroenterology

## 2021-11-27 ENCOUNTER — Other Ambulatory Visit: Payer: Self-pay | Admitting: Internal Medicine

## 2021-11-27 DIAGNOSIS — E785 Hyperlipidemia, unspecified: Secondary | ICD-10-CM

## 2021-12-03 ENCOUNTER — Ambulatory Visit (INDEPENDENT_AMBULATORY_CARE_PROVIDER_SITE_OTHER): Payer: Medicare PPO

## 2021-12-03 VITALS — Ht 61.0 in | Wt 179.0 lb

## 2021-12-03 DIAGNOSIS — Z0000 Encounter for general adult medical examination without abnormal findings: Secondary | ICD-10-CM

## 2021-12-03 NOTE — Progress Notes (Signed)
Subjective:   Leah Melendez is a 76 y.o. female who presents for Medicare Annual (Subsequent) preventive examination.  Review of Systems    No ROS.  Medicare Wellness Virtual Visit.  Visual/audio telehealth visit, UTA vital signs.   See social history for additional risk factors.   Cardiac Risk Factors include: advanced age (>22men, >39 women)     Objective:    Today's Vitals   12/03/21 1234  Weight: 179 lb (81.2 kg)  Height: 5\' 1"  (2.992 m)   Body mass index is 33.82 kg/m.  Advanced Directives 12/03/2021 11/16/2021 11/30/2020 11/30/2019 09/11/2019 07/01/2019 08/12/2018  Does Patient Have a Medical Advance Directive? Yes Yes Yes Yes No No Yes  Type of Advance Directive - [place];Living will [place];Living will [place];Living will - - [place];Living will  Does patient want to make changes to medical advance directive? No - Patient declined No - Patient declined No - Patient declined No - Patient declined - - -  Copy of [place] in Chart? No - copy requested No - copy requested No - copy requested No - copy requested - - No - copy requested  Would patient like information on creating a medical advance directive? - - - - - No - Guardian declined -    Current Medications (verified) Outpatient Encounter Medications as of 12/03/2021  Medication Sig   albuterol (VENTOLIN HFA) 108 (90 Base) MCG/ACT inhaler INHALE 1 TO 2 PUFFS INTO THE LUNGS EVERY 6 HOURS AS NEEDED FOR WHEEZING OR SHORTNESS OF BREATH OR COUGH   ALPRAZolam (XANAX) 0.25 MG tablet Take 0.5 mg by mouth 2 (two) times daily as needed.  (Patient not taking: Reported on 09/11/2021)   Ascorbic Acid (VITAMIN C) 500 MG tablet Take 500 mg by mouth daily.   aspirin EC 81 MG tablet Take 81 mg by mouth daily.   azelastine (ASTELIN) 0.1 % nasal spray Place 2 sprays into both nostrils as needed. Use in each nostril as directed    Calcium Carbonate-Vitamin D (CALCIUM 600/VITAMIN D PO) Take 1 tablet by mouth 2 (two) times daily.   Diclofenac Sodium (VOLTAREN EX) Apply topically as needed.   ezetimibe (ZETIA) 10 MG tablet TAKE 1 TABLET(10 MG) BY MOUTH DAILY   fish oil-omega-3 fatty acids 1000 MG capsule Take 2 g by mouth daily.   fluticasone (FLONASE) 50 MCG/ACT nasal spray Place into both nostrils daily.   loratadine (CLARITIN) 10 MG tablet Take 10 mg by mouth daily as needed for allergies.   melatonin 5 MG TABS Take 5 mg by mouth at bedtime as needed.   metoprolol tartrate (LOPRESSOR) 25 MG tablet TAKE 1/2 TABLET BY MOUTH TWICE DAILY   pantoprazole (PROTONIX) 40 MG tablet Take 1 tablet (40 mg total) by mouth daily. 30 min before food   simvastatin (ZOCOR) 40 MG tablet TAKE 1 TABLET(40 MG) BY MOUTH DAILY   vitamin E 400 UNIT capsule Take 400 Units by mouth daily.   No facility-administered encounter medications on file as of 12/03/2021.    Allergies (verified) Dexilant [dexlansoprazole] and Sulfonamide derivatives   History: Past Medical History:  Diagnosis Date   Anxiety    Basal cell carcinoma 03/2019   right upper forehead, MOHS   Basal cell carcinoma 10/2012   Left upper back   COVID-19    04/11/21 tx'ed paxlovid on call Dr. Scarlette Calico   Fatty liver 2010   GERD (gastroesophageal reflux disease)  H. pylori infection    Hemangioma 2010   Hiatal hernia    Hyperlipidemia    Hypertension    Liver hemangioma    Past Surgical History:  Procedure Laterality Date   bilateral lens replacement     CHOLECYSTECTOMY     COLONOSCOPY  2012   KNEE SURGERY  06/29/2007   Meniscus tear, left knee surgery   Family History  Problem Relation Age of Onset   Hypertension Sister    Skin cancer Sister    Hypertension Sister    Heart attack Sister        40 y.o as of 09/10/19    Anxiety disorder Sister    Hypertension Brother    Colon cancer Neg Hx    Colon polyps Neg Hx    Esophageal cancer Neg Hx    Rectal  cancer Neg Hx    Stomach cancer Neg Hx    Social History   Socioeconomic History   Marital status: Widowed    Spouse name: Not on file   Number of children: 2   Years of education: Not on file   Highest education level: Not on file  Occupational History   Occupation: Retired Garment/textile technologist  Tobacco Use   Smoking status: Never   Smokeless tobacco: Never  Vaping Use   Vaping Use: Never used  Substance and Sexual Activity   Alcohol use: No   Drug use: No   Sexual activity: Not Currently    Birth control/protection: None  Other Topics Concern   Not on file  Social History Narrative   Has living will.  Widow as of 04/08/18.  Daughters have a copy of living will and [name] would be HPOA if husband was not able.   1 daughter lives in [place] has 4 kids and 1 daughter in [place] with 2 kids      Would desire CPR.  She would not want heroic measures.            Social Determinants of Health   Financial Resource Strain: Low Risk    Difficulty of Paying Living Expenses: Not hard at all  Food Insecurity: No Food Insecurity   Worried About Charity fundraiser in the Last Year: Never true   Wann in the Last Year: Never true  Transportation Needs: No Transportation Needs   Lack of Transportation (Medical): No   Lack of Transportation (Non-Medical): No  Physical Activity: Sufficiently Active   Days of Exercise per Week: 4 days   Minutes of Exercise per Session: 40 min  Stress: No Stress Concern Present   Feeling of Stress : Not at all  Social Connections: Unknown   Frequency of Communication with Friends and Family: More than three times a week   Frequency of Social Gatherings with Friends and Family: More than three times a week   Attends Religious Services: More than 4 times per year   Active Member of [employer] or Organizations: Not on file   Attends Archivist Meetings: Not on file   Marital Status: Widowed    Tobacco  Counseling Counseling given: Not Answered   Clinical Intake:  Pre-visit preparation completed: Yes        Diabetes: No  How often do you need to have someone help you when you read instructions, pamphlets, or other written materials from your doctor or pharmacy?: 1 - Never   Interpreter Needed?: No      Activities of Daily Living  In your present state of health, do you have any difficulty performing the following activities: 12/03/2021  Hearing? N  Vision? N  Difficulty concentrating or making decisions? N  Walking or climbing stairs? N  Dressing or bathing? N  Doing errands, shopping? N  Preparing Food and eating ? N  Using the Toilet? N  In the past six months, have you accidently leaked urine? N  Do you have problems with loss of bowel control? N  Managing your Medications? N  Managing your Finances? N  Housekeeping or managing your Housekeeping? N  Some recent data might be hidden    Patient Care Team: McLean-Scocuzza, Nino Glow, MD as PCP - General (Internal Medicine) Johnney Ou., MD as Consulting Physician (Internal Medicine) Inda Castle, MD (Inactive) as Consulting Physician (Gastroenterology)  Indicate any recent Medical Services you may have received from other than [facility] providers in the past year (date may be approximate).     Assessment:   This is a routine wellness examination for [place].  Virtual Visit via Telephone Note  I connected with  Minda Ditto on 12/03/21 at 12:30 PM EST by telephone and verified that I am speaking with the correct person using two identifiers.  Persons participating in the virtual visit: patient/Nurse Health Advisor   I discussed the limitations, risks, security and privacy concerns of performing an evaluation and management service by telephone and the availability of in person appointments. The patient expressed understanding and agreed to proceed.  Interactive audio and video telecommunications  were attempted between this nurse and patient, however failed, due to patient having technical difficulties OR patient did not have access to video capability.  We continued and completed visit with audio only.  Some vital signs may be absent or patient reported.   Hearing/Vision screen Hearing Screening - Comments:: Patient is able to hear conversational tones without difficulty. No issues reported.  Vision Screening - Comments:: Wears corrective lenses when reading. They have seen their ophthalmologist.   Dietary issues and exercise activities discussed: Current Exercise Habits: Home exercise routine, Intensity: Mild Healthy diet Good water intake   Goals Addressed             This Visit's Progress    Increase physical activity   On track    Stretch  Stay active       Depression Screen PHQ 2/9 Scores 12/03/2021 11/30/2020 08/04/2020 03/16/2020 11/30/2019 10/26/2019 08/12/2018  PHQ - 2 Score 0 0 0 0 0 0 0  PHQ- 9 Score - - - - - - -    Fall Risk Fall Risk  12/03/2021 04/13/2021 11/30/2020 11/07/2020 09/27/2020  Falls in the past year? 0 0 0 0 0  Number falls in past yr: 0 0 0 0 0  Injury with Fall? - 0 0 0 0  Follow up Falls evaluation completed Falls evaluation completed Falls evaluation completed Falls evaluation completed Falls evaluation completed    [place]: Home free of loose throw rugs in walkways, pet beds, electrical cords, etc? Yes  Adequate lighting in your home to reduce risk of falls? Yes   ASSISTIVE DEVICES UTILIZED TO PREVENT FALLS: Life alert? No  Use of a cane, walker or w/c? No   TIMED UP AND GO: Was the test performed? No .   Cognitive Function: Patient is alert and oriented x3.  MMSE - Mini Mental State Exam 07/29/2017 07/22/2016  Orientation to time 5 5  Orientation to Place 5 5  Registration 3 3  Attention/ Calculation 0 0  Recall 3 3  Language- name 2 objects 0 0  Language- repeat 1 1  Language- follow 3 step  command 3 3  Language- read & follow direction 0 0  Write a sentence 0 0  Copy design 0 0  Total score 20 20     6CIT Screen 11/30/2020 11/30/2019  What Year? 0 points 0 points  What month? 0 points 0 points  What time? 0 points 0 points  Count back from 20 0 points -  Months in reverse 0 points -    Immunizations Immunization History  Administered Date(s) Administered   Fluad Quad(high Dose 65+) 08/16/2019   Influenza Split 07/10/2011   Influenza Whole 07/27/2010, 07/18/2012   Influenza, High Dose Seasonal PF 07/21/2020   Influenza,inj,Quad PF,6+ Mos 07/14/2014   Influenza,inj,quad, With Preservative 08/07/2021   Influenza-Unspecified 06/28/2013, 06/19/2015   PFIZER(Purple Top)SARS-COV-2 Vaccination 12/15/2019, 01/05/2020, 09/27/2020   Pneumococcal Conjugate-13 04/05/2014   Pneumococcal Polysaccharide-23 01/13/2013   Td 11/30/2008   Tdap 03/22/2020   Zoster Recombinat (Shingrix) 01/29/2018, 05/06/2018   Zoster, Live 11/30/2008   Screening Tests Health Maintenance  Topic Date Due   COVID-19 Vaccine (4 - Booster for [place] series) 12/19/2021 (Originally 11/22/2020)   TETANUS/TDAP  03/22/2030   Pneumonia Vaccine 70+ Years old  Completed   INFLUENZA VACCINE  Completed   DEXA SCAN  Completed   Hepatitis C Screening  Completed   Zoster Vaccines- Shingrix  Completed   HPV VACCINES  Aged Out   COLONOSCOPY (Pts 45-26yrs Insurance coverage will need to be confirmed)  Discontinued   Health Maintenance There are no preventive care reminders to display for this patient.  Mammogram- completed 08/15/21.  Lung Cancer Screening: (Low Dose CT Chest recommended if Age 75-80 years, 30 pack-year currently smoking OR have quit w/in 15years.) does not qualify.   Vision Screening: Recommended annual ophthalmology exams for early detection of glaucoma and other disorders of the eye.  Dental Screening: Recommended annual dental exams for proper oral hygiene  Community Resource Referral /  Chronic Care Management: CRR required this visit?  No   CCM required this visit?  No      Plan:   Keep all routine maintenance appointments.   I have personally reviewed and noted the following in the patients chart:   Medical and social history Use of alcohol, tobacco or illicit drugs  Current medications and supplements including opioid prescriptions.  Functional ability and status Nutritional status Physical activity Advanced directives List of other physicians Hospitalizations, surgeries, and ER visits in previous 12 months Vitals Screenings to include cognitive, depression, and falls Referrals and appointments  In addition, I have reviewed and discussed with patient certain preventive protocols, quality metrics, and best practice recommendations. A written personalized care plan for preventive services as well as general preventive health recommendations were provided to patient via mychart.     Varney Biles, LPN   04/04/5915

## 2021-12-03 NOTE — Patient Instructions (Addendum)
Leah Melendez , Thank you for taking time to come for your Medicare Wellness Visit. I appreciate your ongoing commitment to your health goals. Please review the following plan we discussed and let me know if I can assist you in the future.   These are the goals we discussed:  Goals      Follow up with Primary Care Provider     As needed.     Increase physical activity     Stretch  Stay active        This is a list of the screening recommended for you and due dates:  Health Maintenance  Topic Date Due   COVID-19 Vaccine (4 - Booster for Pfizer series) 12/19/2021*   Tetanus Vaccine  03/22/2030   Pneumonia Vaccine  Completed   Flu Shot  Completed   DEXA scan (bone density measurement)  Completed   Hepatitis C Screening: USPSTF Recommendation to screen - Ages 32-79 yo.  Completed   Zoster (Shingles) Vaccine  Completed   HPV Vaccine  Aged Out   Colon Cancer Screening  Discontinued  *Topic was postponed. The date shown is not the original due date.    Advanced directives: End of life planning; Advance aging; Advanced directives discussed.  Copy of current HCPOA/Living Will requested.    Conditions/risks identified: none new  Follow up in one year for your annual wellness visit    Preventive Care 65 Years and Older, Female Preventive care refers to lifestyle choices and visits with your health care provider that can promote health and wellness. What does preventive care include? A yearly physical exam. This is also called an annual well check. Dental exams once or twice a year. Routine eye exams. Ask your health care provider how often you should have your eyes checked. Personal lifestyle choices, including: Daily care of your teeth and gums. Regular physical activity. Eating a healthy diet. Avoiding tobacco and drug use. Limiting alcohol use. Practicing safe sex. Taking low-dose aspirin every day. Taking vitamin and mineral supplements as recommended by your health care  provider. What happens during an annual well check? The services and screenings done by your health care provider during your annual well check will depend on your age, overall health, lifestyle risk factors, and family history of disease. Counseling  Your health care provider may ask you questions about your: Alcohol use. Tobacco use. Drug use. Emotional well-being. Home and relationship well-being. Sexual activity. Eating habits. History of falls. Memory and ability to understand (cognition). Work and work Statistician. Reproductive health. Screening  You may have the following tests or measurements: Height, weight, and BMI. Blood pressure. Lipid and cholesterol levels. These may be checked every 5 years, or more frequently if you are over 35 years old. Skin check. Lung cancer screening. You may have this screening every year starting at age 89 if you have a 30-pack-year history of smoking and currently smoke or have quit within the past 15 years. Fecal occult blood test (FOBT) of the stool. You may have this test every year starting at age 28. Flexible sigmoidoscopy or colonoscopy. You may have a sigmoidoscopy every 5 years or a colonoscopy every 10 years starting at age 60. Hepatitis C blood test. Hepatitis B blood test. Sexually transmitted disease (STD) testing. Diabetes screening. This is done by checking your blood sugar (glucose) after you have not eaten for a while (fasting). You may have this done every 1-3 years. Bone density scan. This is done to screen for osteoporosis. You may  have this done starting at age 6. Mammogram. This may be done every 1-2 years. Talk to your health care provider about how often you should have regular mammograms. Talk with your health care provider about your test results, treatment options, and if necessary, the need for more tests. Vaccines  Your health care provider may recommend certain vaccines, such as: Influenza vaccine. This is  recommended every year. Tetanus, diphtheria, and acellular pertussis (Tdap, Td) vaccine. You may need a Td booster every 10 years. Zoster vaccine. You may need this after age 27. Pneumococcal 13-valent conjugate (PCV13) vaccine. One dose is recommended after age 46. Pneumococcal polysaccharide (PPSV23) vaccine. One dose is recommended after age 32. Talk to your health care provider about which screenings and vaccines you need and how often you need them. This information is not intended to replace advice given to you by your health care provider. Make sure you discuss any questions you have with your health care provider. Document Released: 11/10/2015 Document Revised: 07/03/2016 Document Reviewed: 08/15/2015 Elsevier Interactive Patient Education  2017 [place] Prevention in the Home Falls can cause injuries. They can happen to people of all ages. There are many things you can do to make your home safe and to help prevent falls. What can I do on the outside of my home? Regularly fix the edges of walkways and driveways and fix any cracks. Remove anything that might make you trip as you walk through a door, such as a raised step or threshold. Trim any bushes or trees on the path to your home. Use bright outdoor lighting. Clear any walking paths of anything that might make someone trip, such as rocks or tools. Regularly check to see if handrails are loose or broken. Make sure that both sides of any steps have handrails. Any raised decks and porches should have guardrails on the edges. Have any leaves, snow, or ice cleared regularly. Use sand or salt on walking paths during winter. Clean up any spills in your garage right away. This includes oil or grease spills. What can I do in the bathroom? Use night lights. Install grab bars by the toilet and in the tub and shower. Do not use towel bars as grab bars. Use non-skid mats or decals in the tub or shower. If you need to sit down in  the shower, use a plastic, non-slip stool. Keep the floor dry. Clean up any water that spills on the floor as soon as it happens. Remove soap buildup in the tub or shower regularly. Attach bath mats securely with double-sided non-slip rug tape. Do not have throw rugs and other things on the floor that can make you trip. What can I do in the bedroom? Use night lights. Make sure that you have a light by your bed that is easy to reach. Do not use any sheets or blankets that are too big for your bed. They should not hang down onto the floor. Have a firm chair that has side arms. You can use this for support while you get dressed. Do not have throw rugs and other things on the floor that can make you trip. What can I do in the kitchen? Clean up any spills right away. Avoid walking on wet floors. Keep items that you use a lot in easy-to-reach places. If you need to reach something above you, use a strong step stool that has a grab bar. Keep electrical cords out of the way. Do not use floor polish  or wax that makes floors slippery. If you must use wax, use non-skid floor wax. Do not have throw rugs and other things on the floor that can make you trip. What can I do with my stairs? Do not leave any items on the stairs. Make sure that there are handrails on both sides of the stairs and use them. Fix handrails that are broken or loose. Make sure that handrails are as long as the stairways. Check any carpeting to make sure that it is firmly attached to the stairs. Fix any carpet that is loose or worn. Avoid having throw rugs at the top or bottom of the stairs. If you do have throw rugs, attach them to the floor with carpet tape. Make sure that you have a light switch at the top of the stairs and the bottom of the stairs. If you do not have them, ask someone to add them for you. What else can I do to help prevent falls? Wear shoes that: Do not have high heels. Have rubber bottoms. Are comfortable  and fit you well. Are closed at the toe. Do not wear sandals. If you use a stepladder: Make sure that it is fully opened. Do not climb a closed stepladder. Make sure that both sides of the stepladder are locked into place. Ask someone to hold it for you, if possible. Clearly mark and make sure that you can see: Any grab bars or handrails. First and last steps. Where the edge of each step is. Use tools that help you move around (mobility aids) if they are needed. These include: Canes. Walkers. Scooters. Crutches. Turn on the lights when you go into a dark area. Replace any light bulbs as soon as they burn out. Set up your furniture so you have a clear path. Avoid moving your furniture around. If any of your floors are uneven, fix them. If there are any pets around you, be aware of where they are. Review your medicines with your doctor. Some medicines can make you feel dizzy. This can increase your chance of falling. Ask your doctor what other things that you can do to help prevent falls. This information is not intended to replace advice given to you by your health care provider. Make sure you discuss any questions you have with your health care provider. Document Released: 08/10/2009 Document Revised: 03/21/2016 Document Reviewed: 11/18/2014 Elsevier Interactive Patient Education  2017 [employer].

## 2022-01-15 ENCOUNTER — Other Ambulatory Visit: Payer: Self-pay

## 2022-01-15 ENCOUNTER — Ambulatory Visit: Payer: Medicare PPO | Admitting: Dermatology

## 2022-01-15 DIAGNOSIS — L578 Other skin changes due to chronic exposure to nonionizing radiation: Secondary | ICD-10-CM | POA: Diagnosis not present

## 2022-01-15 DIAGNOSIS — D225 Melanocytic nevi of trunk: Secondary | ICD-10-CM

## 2022-01-15 DIAGNOSIS — L821 Other seborrheic keratosis: Secondary | ICD-10-CM

## 2022-01-15 DIAGNOSIS — L820 Inflamed seborrheic keratosis: Secondary | ICD-10-CM

## 2022-01-15 DIAGNOSIS — Z85828 Personal history of other malignant neoplasm of skin: Secondary | ICD-10-CM | POA: Diagnosis not present

## 2022-01-15 DIAGNOSIS — D229 Melanocytic nevi, unspecified: Secondary | ICD-10-CM | POA: Diagnosis not present

## 2022-01-15 DIAGNOSIS — D1800 Hemangioma unspecified site: Secondary | ICD-10-CM

## 2022-01-15 DIAGNOSIS — L814 Other melanin hyperpigmentation: Secondary | ICD-10-CM

## 2022-01-15 DIAGNOSIS — L720 Epidermal cyst: Secondary | ICD-10-CM

## 2022-01-15 DIAGNOSIS — Z1283 Encounter for screening for malignant neoplasm of skin: Secondary | ICD-10-CM | POA: Diagnosis not present

## 2022-01-15 DIAGNOSIS — D2371 Other benign neoplasm of skin of right lower limb, including hip: Secondary | ICD-10-CM | POA: Diagnosis not present

## 2022-01-15 NOTE — Progress Notes (Signed)
? ?Follow-Up Visit ?  ?Subjective  ?Leah Melendez is a 76 y.o. female who presents for the following: Follow-up (Patient here today for 1 year tbse. Patient has history of bcc , milia at face, and dermatofiborma.  Patient reports a spot at left neck, under bilateral breast, and left shoulder she would like checked. ).  Also has other spots on leg that get irritated. ? ?The patient presents for Total-Body Skin Exam (TBSE) for skin cancer screening and mole check.  The patient has spots, moles and lesions to be evaluated, some may be new or changing and the patient has concerns that these could be cancer. ? ? ?The following portions of the chart were reviewed this encounter and updated as appropriate:   ?  ? ?Review of Systems: No other skin or systemic complaints except as noted in HPI or Assessment and Plan. ?Left ant thigh waxy pink macule isk  ? ?Objective  ?Well appearing patient in no apparent distress; mood and affect are within normal limits. ? ?A full examination was performed including scalp, head, eyes, ears, nose, lips, neck, chest, axillae, abdomen, back, buttocks, bilateral upper extremities, bilateral lower extremities, hands, feet, fingers, toes, fingernails, and toenails. All findings within normal limits unless otherwise noted below. ? ?left abdomen ?9 x 5 mm brown macule lighter edge  ? ?Right Upper Back ?5 x 3 medium brown speckled papule  ? ?left nasal root ?Smooth white papule(s).  ? ?left anterior thigh x 1, left upper calf x 1, right popliteal x 1, left shoulder x 1 (4) ?Waxy pink macules and 6 mm waxy brown macule at right popliteal  ? ? ?Assessment & Plan  ?Nevus (2) ?left abdomen; Right Upper Back ? ?Benign-appearing.  Stable. Observation.  Call clinic for new or changing lesions.  Recommend daily use of broad spectrum spf 30+ sunscreen to sun-exposed areas.  ? ? ?Milia ?left nasal root ? ?Benign, observe ? ?Inflamed seborrheic keratosis (4) ?left anterior thigh x 1, left upper calf  x 1, right popliteal x 1, left shoulder x 1 ? ?Destruction of lesion - left anterior thigh x 1, left upper calf x 1, right popliteal x 1, left shoulder x 1 ? ?Destruction method: cryotherapy   ?Informed consent: discussed and consent obtained   ?Lesion destroyed using liquid nitrogen: Yes   ?Region frozen until ice ball extended beyond lesion: Yes   ?Outcome: patient tolerated procedure well with no complications   ?Post-procedure details: wound care instructions given   ?Additional details:  Prior to procedure, discussed risks of blister formation, small wound, skin dyspigmentation, or rare scar following cryotherapy. Recommend Vaseline ointment to treated areas while healing. ? ? ?Lentigines ?- Scattered tan macules ?- Due to sun exposure ?- Benign-appearing, observe ?- Recommend daily broad spectrum sunscreen SPF 30+ to sun-exposed areas, reapply every 2 hours as needed. ?- Call for any changes ? ?Dermatofibroma ?- Firm pink/brown papulenodule with dimple sign right anterior ankle  ?- Benign appearing ?- Call for any changes ? ?Seborrheic Keratoses ?- Stuck-on, waxy, tan-brown papules and/or plaques at bilateral inframammary , left shoulder  ?- Benign-appearing ?- Discussed benign etiology and prognosis. ?- Observe ?- Call for any changes ? ?Melanocytic Nevi ?- Tan-brown and/or pink-flesh-colored symmetric macules and papules ?- Benign appearing on exam today ?- Observation ?- Call clinic for new or changing moles ?- Recommend daily use of broad spectrum spf 30+ sunscreen to sun-exposed areas.  ? ?Hemangiomas ?- Red papules ?- Discussed benign nature ?- Observe ?- Call  for any changes ? ?Actinic Damage ?- Chronic condition, secondary to cumulative UV/sun exposure ?- diffuse scaly erythematous macules with underlying dyspigmentation ?- Recommend daily broad spectrum sunscreen SPF 30+ to sun-exposed areas, reapply every 2 hours as needed.  ?- Staying in the shade or wearing long sleeves, sun glasses (UVA+UVB  protection) and wide brim hats (4-inch brim around the entire circumference of the hat) are also recommended for sun protection.  ?- Call for new or changing lesions. ? ?History of Basal Cell Carcinoma of the Skin ?- No evidence of recurrence today  right upper forehead Mohs (2020) and left upper back (2014) ?   Recommend regular full body skin exams ?- Recommend daily broad spectrum sunscreen SPF 30+ to sun-exposed areas, reapply every 2 hours as needed.  ?- Call if any new or changing lesions are noted between office visits ? ?Skin cancer screening performed today. ?Return for 1 year tbse h/o bcc . ?I, Ruthell Rummage, CMA, am acting as scribe for Brendolyn Patty, MD. ? ?Documentation: I have reviewed the above documentation for accuracy and completeness, and I agree with the above. ? ?Brendolyn Patty MD  ? ?

## 2022-01-15 NOTE — Patient Instructions (Addendum)
Seborrheic Keratosis ? ?What causes seborrheic keratoses? ?Seborrheic keratoses are harmless, common skin growths that first appear during adult life.  As time goes by, more growths appear.  Some people may develop a large number of them.  Seborrheic keratoses appear on both covered and uncovered body parts.  They are not caused by sunlight.  The tendency to develop seborrheic keratoses can be inherited.  They vary in color from skin-colored to gray, brown, or even black.  They can be either smooth or have a rough, warty surface.   ?Seborrheic keratoses are superficial and look as if they were stuck on the skin.  Under the microscope this type of keratosis looks like layers upon layers of skin.  That is why at times the top layer may seem to fall off, but the rest of the growth remains and re-grows.   ? ?Treatment ?Seborrheic keratoses do not need to be treated, but can easily be removed in the office.  Seborrheic keratoses often cause symptoms when they rub on clothing or jewelry.  Lesions can be in the way of shaving.  If they become inflamed, they can cause itching, soreness, or burning.  Removal of a seborrheic keratosis can be accomplished by freezing, burning, or surgery. ?If any spot bleeds, scabs, or grows rapidly, please return to have it checked, as these can be an indication of a skin cancer. ? ?Cryotherapy Aftercare ? ?Wash gently with soap and water everyday.   ?Apply Vaseline and Band-Aid daily until healed.  ? ? ?Melanoma ABCDEs ? ?Melanoma is the most dangerous type of skin cancer, and is the leading cause of death from skin disease.  You are more likely to develop melanoma if you: ?Have light-colored skin, light-colored eyes, or red or blond hair ?Spend a lot of time in the sun ?Tan regularly, either outdoors or in a tanning bed ?Have had blistering sunburns, especially during childhood ?Have a close family member who has had a melanoma ?Have atypical moles or large birthmarks ? ?Early detection of  melanoma is key since treatment is typically straightforward and cure rates are extremely high if we catch it early.  ? ?The first sign of melanoma is often a change in a mole or a new dark spot.  The ABCDE system is a way of remembering the signs of melanoma. ? ?A for asymmetry:  The two halves do not match. ?B for border:  The edges of the growth are irregular. ?C for color:  A mixture of colors are present instead of an even brown color. ?D for diameter:  Melanomas are usually (but not always) greater than 63m - the size of a pencil eraser. ?E for evolution:  The spot keeps changing in size, shape, and color. ? ?Please check your skin once per month between visits. You can use a small mirror in front and a large mirror behind you to keep an eye on the back side or your body.  ? ?If you see any new or changing lesions before your next follow-up, please call to schedule a visit. ? ?Please continue daily skin protection including broad spectrum sunscreen SPF 30+ to sun-exposed areas, reapplying every 2 hours as needed when you're outdoors.  ? ?Staying in the shade or wearing long sleeves, sun glasses (UVA+UVB protection) and wide brim hats (4-inch brim around the entire circumference of the hat) are also recommended for sun protection.   ? ?If You Need Anything After Your Visit ? ?If you have any questions or concerns for  your doctor, please call our main line at [phone] and press option 4 to reach your doctor's medical assistant. If no one answers, please leave a voicemail as directed and we will return your call as soon as possible. Messages left after 4 pm will be answered the following business day.  ? ?You may also send [place] a message via MyChart. We typically respond to MyChart messages within 1-2 business days. ? ?For prescription refills, please ask your pharmacy to contact our office. Our fax number is [phone]. ? ?If you have an urgent issue when the clinic is closed that cannot wait until the next  business day, you can page your doctor at the number below.   ? ?Please note that while we do our best to be available for urgent issues outside of office hours, we are not available 24/7.  ? ?If you have an urgent issue and are unable to reach [place], you may choose to seek medical care at your doctor's office, retail clinic, urgent care center, or emergency room. ? ?If you have a medical emergency, please immediately call 911 or go to the emergency department. ? ?Pager Numbers ? ?- Dr. Nehemiah Massed: [phone] ? ?- Dr. Laurence Ferrari: [phone] ? ?- Dr. Nicole Kindred: [phone] ? ?In the event of inclement weather, please call our main line at [phone] for an update on the status of any delays or closures. ? ?Dermatology Medication Tips: ?Please keep the boxes that topical medications come in in order to help keep track of the instructions about where and how to use these. Pharmacies typically print the medication instructions only on the boxes and not directly on the medication tubes.  ? ?If your medication is too expensive, please contact our office at [phone] option 4 or send [place] a message through [place].  ? ?We are unable to tell what your co-pay for medications will be in advance as this is different depending on your insurance coverage. However, we may be able to find a substitute medication at lower cost or fill out paperwork to get insurance to cover a needed medication.  ? ?If a prior authorization is required to get your medication covered by your insurance company, please allow [place] 1-2 business days to complete this process. ? ?Drug prices often vary depending on where the prescription is filled and some pharmacies may offer cheaper prices. ? ?The website www.goodrx.com contains coupons for medications through different pharmacies. The prices here do not account for what the cost may be with help from insurance (it may be cheaper with your insurance), but the website can give you the price if you did not use  any insurance.  ?- You can print the associated coupon and take it with your prescription to the pharmacy.  ?- You may also stop by our office during regular business hours and pick up a GoodRx coupon card.  ?- If you need your prescription sent electronically to a different pharmacy, notify our office through [facility] or by phone at [phone] option 4. ? ? ? ? ?Si Usted Necesita Algo Despu?s de Su Visita ? ?Tambi?n puede enviarnos un mensaje a trav?s de MyChart. Por lo general respondemos a los mensajes de MyChart en el transcurso de 1 a 2 d?as h?biles. ? ?Para renovar recetas, por favor pida a su farmacia que se ponga en contacto con nuestra oficina. Nuestro n?mero de fax es el [phone]. ? ?Si tiene un asunto urgente cuando la cl?nica est? cerrada y que no puede esperar Museum/gallery curator  siguiente d?a h?bil, puede llamar/localizar a su doctor(a) al n?mero que aparece a continuaci?n.  ? ?Por favor, tenga en cuenta que aunque hacemos todo lo posible para estar disponibles para asuntos urgentes fuera del horario de oficina, no estamos disponibles las 24 horas del d?a, los 7 d?as de la semana.  ? ?Si tiene un problema urgente y no puede comunicarse con nosotros, puede optar por buscar atenci?n m?dica  en el consultorio de su doctor(a), en una cl?nica privada, en un centro de atenci?n urgente o en una sala de emergencias. ? ?Si tiene Engineer, maintenance (IT) m?dica, por favor llame inmediatamente al 911 o vaya a la sala de emergencias. ? ?N?meros de b?per ? ?- Dr. Nehemiah Massed: [phone] ? ?- Dra. Moye: [phone] ? ?- Dra. Nicole Kindred: [phone] ? ?En caso de inclemencias del tiempo, por favor llame a nuestra l?nea principal al [phone] para una actualizaci?n sobre el estado de cualquier retraso o cierre. ? ?Consejos para la medicaci?n en dermatolog?a: ?Por favor, guarde las cajas en las que vienen los medicamentos de uso t?pico para ayudarle a seguir las instrucciones sobre d?nde y c?mo usarlos. Las farmacias  generalmente imprimen las instrucciones del medicamento s?lo en las cajas y no directamente en los tubos del [place].  ? ?Si su medicamento es muy caro, por favor, p?ngase en contacto con nuestra oficin

## 2022-01-22 ENCOUNTER — Ambulatory Visit
Admission: EM | Admit: 2022-01-22 | Discharge: 2022-01-22 | Disposition: A | Discharge status: Home / Self Care | Payer: Medicare PPO | Attending: Emergency Medicine | Admitting: Emergency Medicine

## 2022-01-22 ENCOUNTER — Encounter: Payer: Self-pay | Admitting: Emergency Medicine

## 2022-01-22 ENCOUNTER — Other Ambulatory Visit: Payer: Self-pay

## 2022-01-22 DIAGNOSIS — J988 Other specified respiratory disorders: Secondary | ICD-10-CM

## 2022-01-22 DIAGNOSIS — Z1152 Encounter for screening for COVID-19: Secondary | ICD-10-CM

## 2022-01-22 DIAGNOSIS — B9789 Other viral agents as the cause of diseases classified elsewhere: Secondary | ICD-10-CM | POA: Diagnosis not present

## 2022-01-22 MED ORDER — OSELTAMIVIR PHOSPHATE 75 MG PO CAPS: 75.0000 mg | ORAL_CAPSULE | Freq: Two times a day (BID) | ORAL | 0 refills | Status: DC

## 2022-01-22 MED ORDER — PROMETHAZINE-DM 6.25-15 MG/5ML PO SYRP
5.0000 mL | ORAL_SOLUTION | Freq: Four times a day (QID) | ORAL | 0 refills | Status: DC | PRN
Start: 2022-01-22 — End: 2022-02-26

## 2022-01-22 NOTE — Discharge Instructions (Addendum)
Start the Tamiflu today as I am concerned that your testing will not be back in enough time.   discontinue the Tamiflu if your influenza is negative.  We will prescribe Molnupiravir if your COVID is positive.  In the meantime, Flonase, saline nasal irrigation with a NeilMed sinus rinse or a Nettie pot and distilled water as often as you want, discontinue Mucinex DM, start plain Mucinex, Promethazine DM for the cough. ?

## 2022-01-22 NOTE — ED Triage Notes (Signed)
Pt c/o cough, sinus pressure, HA, bodayches since yesterday  ?

## 2022-01-22 NOTE — ED Provider Notes (Signed)
HPI ? ?SUBJECTIVE: ? ?Leah Melendez is a 76 y.o. female who presents with body aches, cough productive of clear phlegm, headache secondary to cough nasal congestion, rhinorrhea, maxillary sinus pain and pressure, postnasal drip, sore throat secondary to postnasal drip, chest congestion and wheezing at night starting yesterday.  No fevers, facial swelling, upper dental pain, loss of smell or taste, shortness of breath, nausea, vomiting, diarrhea, abdominal pain.  No known COVID or flu exposure.  She got 3 doses of the COVID-vaccine and this years flu vaccine.  No antibiotics in the past month.  No antipyretic in the past 6 hours.  She has tried Mucinex DM, Tylenol, Coricidin high blood pressure and resting.  Mucinex DM helps.  Symptoms are worse with sitting up and with lying down.  She had COVID in June 22 and has a history of sinus tachycardia.  No history of diabetes, hypertension, pulmonary disease, smoking, chronic kidney disease.  PCP: [facility]. ? ? ?Past Medical History:  ?Diagnosis Date  ? Anxiety   ? Basal cell carcinoma 03/2019  ? right upper forehead, MOHS  ? Basal cell carcinoma 10/2012  ? Left upper back  ? COVID-19   ? 04/11/21 tx'ed paxlovid on call Dr. Scarlette Calico  ? Fatty liver 2010  ? GERD (gastroesophageal reflux disease)   ? H. pylori infection   ? Hemangioma 2010  ? Hiatal hernia   ? Hyperlipidemia   ? Hypertension   ? Liver hemangioma   ? ? ?Past Surgical History:  ?Procedure Laterality Date  ? bilateral lens replacement    ? CHOLECYSTECTOMY    ? COLONOSCOPY  2012  ? KNEE SURGERY  06/29/2007  ? Meniscus tear, left knee surgery  ? ? ?Family History  ?Problem Relation Age of Onset  ? Hypertension Sister   ? Skin cancer Sister   ? Hypertension Sister   ? Heart attack Sister   ?     11 y.o as of 09/10/19   ? Anxiety disorder Sister   ? Hypertension Brother   ? Colon cancer Neg Hx   ? Colon polyps Neg Hx   ? Esophageal cancer Neg Hx   ? Rectal cancer Neg Hx   ? Stomach cancer Neg Hx    ? ? ?Social History  ? ?Tobacco Use  ? Smoking status: Never  ? Smokeless tobacco: Never  ?Vaping Use  ? Vaping Use: Never used  ?Substance Use Topics  ? Alcohol use: No  ? Drug use: No  ? ? ?No current facility-administered medications for this encounter. ? ?Current Outpatient Medications:  ?  oseltamivir (TAMIFLU) 75 MG capsule, Take 1 capsule (75 mg total) by mouth 2 (two) times daily. X 5 days, Disp: 10 capsule, Rfl: 0 ?  promethazine-dextromethorphan (PROMETHAZINE-DM) 6.25-15 MG/5ML syrup, Take 5 mLs by mouth 4 (four) times daily as needed for cough., Disp: 118 mL, Rfl: 0 ?  albuterol (VENTOLIN HFA) 108 (90 Base) MCG/ACT inhaler, INHALE 1 TO 2 PUFFS INTO THE LUNGS EVERY 6 HOURS AS NEEDED FOR WHEEZING OR SHORTNESS OF BREATH OR COUGH, Disp: 54 g, Rfl: 1 ?  ALPRAZolam (XANAX) 0.25 MG tablet, Take 0.5 mg by mouth 2 (two) times daily as needed.  (Patient not taking: Reported on 09/11/2021), Disp: , Rfl:  ?  Ascorbic Acid (VITAMIN C) 500 MG tablet, Take 500 mg by mouth daily., Disp: , Rfl:  ?  aspirin EC 81 MG tablet, Take 81 mg by mouth daily., Disp: , Rfl:  ?  azelastine (ASTELIN) 0.1 %  nasal spray, Place 2 sprays into both nostrils as needed. Use in each nostril as directed, Disp: , Rfl:  ?  Calcium Carbonate-Vitamin D (CALCIUM 600/VITAMIN D PO), Take 1 tablet by mouth 2 (two) times daily., Disp: , Rfl:  ?  Diclofenac Sodium (VOLTAREN EX), Apply topically as needed., Disp: , Rfl:  ?  ezetimibe (ZETIA) 10 MG tablet, TAKE 1 TABLET(10 MG) BY MOUTH DAILY, Disp: 90 tablet, Rfl: 3 ?  fish oil-omega-3 fatty acids 1000 MG capsule, Take 2 g by mouth daily., Disp: , Rfl:  ?  fluticasone (FLONASE) 50 MCG/ACT nasal spray, Place into both nostrils daily., Disp: , Rfl:  ?  melatonin 5 MG TABS, Take 5 mg by mouth at bedtime as needed., Disp: , Rfl:  ?  metoprolol tartrate (LOPRESSOR) 25 MG tablet, TAKE 1/2 TABLET BY MOUTH TWICE DAILY, Disp: 90 tablet, Rfl: 3 ?  pantoprazole (PROTONIX) 40 MG tablet, Take 1 tablet (40 mg total)  by mouth daily. 30 min before food, Disp: 90 tablet, Rfl: 3 ?  simvastatin (ZOCOR) 40 MG tablet, TAKE 1 TABLET(40 MG) BY MOUTH DAILY, Disp: 90 tablet, Rfl: 3 ?  vitamin E 400 UNIT capsule, Take 400 Units by mouth daily., Disp: , Rfl:  ? ?Allergies  ?Allergen Reactions  ? Dexilant [Dexlansoprazole] Diarrhea  ? Sulfonamide Derivatives   ?  REACTION: as child  ? ? ? ?ROS ? ?As noted in HPI.  ? ?Physical Exam ? ?BP 137/81 (BP Location: Right Arm)   Pulse (!) 104   Temp 99.4 ?F (37.4 ?C) (Oral)   Resp 18   SpO2 96%  ? ?Constitutional: Well developed, well nourished, no acute distress ?Eyes:  EOMI, conjunctiva normal bilaterally ?HENT: Normocephalic, atraumatic,mucus membranes moist.  Mild frontal sinus tenderness.  No maxillary sinus tenderness.  Normal turbinates.  Clear nasal congestion.  Positive cobblestoning.  No postnasal drip. ?Neck: Positive cervical lymphadenopathy ?Respiratory: Normal inspiratory effort, lungs clear bilaterally.  No anterior, lateral chest wall tenderness ?Cardiovascular: Regular tachycardia, no murmurs, rubs, gallops ?GI: nondistended ?skin: No rash, skin intact ?Musculoskeletal: no deformities ?Neurologic: Alert & oriented x 3, no focal neuro deficits ?Psychiatric: Speech and behavior appropriate ? ? ?ED Course ? ? ?Medications - No data to display ? ?Orders Placed This Encounter  ?Procedures  ? Covid-19, Flu A+B ([facility])  ?  Standing Status:   Standing  ?  Number of Occurrences:   1  ? ? ?No results found for this or any previous visit (from the past 24 hour(s)). ?No results found. ? ?ED Clinical Impression ? ?1. Viral respiratory illness   ?2. Encounter for screening for COVID-19   ?  ? ?ED Assessment/Plan ? ?Checking COVID, flu.  Sending patient home on Tamiflu today as I am concerned that her testing will not be back in enough time to meet the therapeutic window.  She will discontinue the Tamiflu if her influenza is negative.  will prescribe prescribe Molnupiravir if her COVID is  positive.  In the meantime, Flonase, she does not need a prescription of this, saline nasal irrigation, continue Mucinex, Promethazine DM for the cough.  Follow-up with PCP as needed.  Patient has MyChart. ? ?COVID, influenza pending at the time of initial signing of this note. ? ?Discussed labs,  MDM, treatment plan, and plan for follow-up with patient. patient agrees with plan.  ? ?Meds ordered this encounter  ?Medications  ? oseltamivir (TAMIFLU) 75 MG capsule  ?  Sig: Take 1 capsule (75 mg total) by mouth 2 (two)  times daily. X 5 days  ?  Dispense:  10 capsule  ?  Refill:  0  ? promethazine-dextromethorphan (PROMETHAZINE-DM) 6.25-15 MG/5ML syrup  ?  Sig: Take 5 mLs by mouth 4 (four) times daily as needed for cough.  ?  Dispense:  118 mL  ?  Refill:  0  ? ? ? ? ?*This clinic note was created using [employer]. Therefore, there may be occasional mistakes despite careful proofreading. ? ?? ? ?  ?Melynda Ripple, MD ?01/23/22 [phone] ? ?

## 2022-01-24 LAB — COVID-19, FLU A+B NAA
Influenza A, NAA: NOT DETECTED
Influenza B, NAA: NOT DETECTED
SARS-CoV-2, NAA: NOT DETECTED

## 2022-02-26 ENCOUNTER — Encounter: Payer: Self-pay | Admitting: Internal Medicine

## 2022-02-26 ENCOUNTER — Ambulatory Visit: Payer: Medicare PPO | Admitting: Internal Medicine

## 2022-02-26 VITALS — BP 126/80 | HR 66 | Temp 97.6°F | Resp 14 | Ht 61.0 in | Wt 178.4 lb

## 2022-02-26 DIAGNOSIS — N3000 Acute cystitis without hematuria: Secondary | ICD-10-CM

## 2022-02-26 DIAGNOSIS — E785 Hyperlipidemia, unspecified: Secondary | ICD-10-CM

## 2022-02-26 DIAGNOSIS — J9801 Acute bronchospasm: Secondary | ICD-10-CM | POA: Diagnosis not present

## 2022-02-26 DIAGNOSIS — R7989 Other specified abnormal findings of blood chemistry: Secondary | ICD-10-CM

## 2022-02-26 DIAGNOSIS — K219 Gastro-esophageal reflux disease without esophagitis: Secondary | ICD-10-CM

## 2022-02-26 DIAGNOSIS — R000 Tachycardia, unspecified: Secondary | ICD-10-CM

## 2022-02-26 DIAGNOSIS — I700 Atherosclerosis of aorta: Secondary | ICD-10-CM

## 2022-02-26 DIAGNOSIS — M25552 Pain in left hip: Secondary | ICD-10-CM | POA: Diagnosis not present

## 2022-02-26 DIAGNOSIS — G8929 Other chronic pain: Secondary | ICD-10-CM

## 2022-02-26 DIAGNOSIS — I10 Essential (primary) hypertension: Secondary | ICD-10-CM | POA: Diagnosis not present

## 2022-02-26 DIAGNOSIS — M545 Low back pain, unspecified: Secondary | ICD-10-CM | POA: Diagnosis not present

## 2022-02-26 DIAGNOSIS — R319 Hematuria, unspecified: Secondary | ICD-10-CM

## 2022-02-26 LAB — LIPID PANEL
Cholesterol: 151 mg/dL (ref 0–200)
HDL: 54.7 mg/dL (ref >39.00)
LDL Cholesterol: 65 mg/dL (ref 0–99)
NonHDL: 95.84
Total CHOL/HDL Ratio: 3
Triglycerides: 156 mg/dL — ABNORMAL HIGH (ref 0.0–149.0)
VLDL: 31.2 mg/dL (ref 0.0–40.0)

## 2022-02-26 LAB — CBC WITH DIFFERENTIAL/PLATELET
Basophils Absolute: 0 10*3/uL (ref 0.0–0.1)
Basophils Relative: 0.4 % (ref 0.0–3.0)
Eosinophils Absolute: 0.1 10*3/uL (ref 0.0–0.7)
Eosinophils Relative: 2.4 % (ref 0.0–5.0)
HCT: 42.2 % (ref 36.0–46.0)
Hemoglobin: 14 g/dL (ref 12.0–15.0)
Lymphocytes Relative: 23.7 % (ref 12.0–46.0)
Lymphs Abs: 1.3 10*3/uL (ref 0.7–4.0)
MCHC: 33.3 g/dL (ref 30.0–36.0)
MCV: 92.7 fl (ref 78.0–100.0)
Monocytes Absolute: 0.5 10*3/uL (ref 0.1–1.0)
Monocytes Relative: 8.2 % (ref 3.0–12.0)
Neutro Abs: 3.7 10*3/uL (ref 1.4–7.7)
Neutrophils Relative %: 65.3 % (ref 43.0–77.0)
Platelets: 178 10*3/uL (ref 150.0–400.0)
RBC: 4.55 Mil/uL (ref 3.87–5.11)
RDW: 12.4 % (ref 11.5–15.5)
WBC: 5.7 10*3/uL (ref 4.0–10.5)

## 2022-02-26 LAB — COMPREHENSIVE METABOLIC PANEL
ALT: 11 U/L (ref 0–35)
AST: 16 U/L (ref 0–37)
Albumin: 4.1 g/dL (ref 3.5–5.2)
Alkaline Phosphatase: 68 U/L (ref 39–117)
BUN: 12 mg/dL (ref 6–23)
CO2: 28 mEq/L (ref 19–32)
Calcium: 9.3 mg/dL (ref 8.4–10.5)
Chloride: 104 mEq/L (ref 96–112)
Creatinine, Ser: 0.98 mg/dL (ref 0.40–1.20)
GFR: 55.7 mL/min — ABNORMAL LOW (ref >60.00)
Glucose, Bld: 83 mg/dL (ref 70–99)
Potassium: 4.4 mEq/L (ref 3.5–5.1)
Sodium: 139 mEq/L (ref 135–145)
Total Bilirubin: 1.5 mg/dL — ABNORMAL HIGH (ref 0.2–1.2)
Total Protein: 7 g/dL (ref 6.0–8.3)

## 2022-02-26 LAB — TSH: TSH: 4.39 u[IU]/mL (ref 0.35–5.50)

## 2022-02-26 MED ORDER — SIMVASTATIN 40 MG PO TABS: ORAL_TABLET | ORAL | 3 refills | Status: DC

## 2022-02-26 MED ORDER — METOPROLOL TARTRATE 25 MG PO TABS: 12.5000 mg | ORAL_TABLET | Freq: Two times a day (BID) | ORAL | 3 refills | Status: DC

## 2022-02-26 MED ORDER — PANTOPRAZOLE SODIUM 40 MG PO TBEC: 40.0000 mg | DELAYED_RELEASE_TABLET | Freq: Every day | ORAL | 3 refills | Status: DC

## 2022-02-26 MED ORDER — EZETIMIBE 10 MG PO TABS: ORAL_TABLET | ORAL | 3 refills | Status: DC

## 2022-02-26 MED ORDER — ALBUTEROL SULFATE HFA 108 (90 BASE) MCG/ACT IN AERS: INHALATION_SPRAY | RESPIRATORY_TRACT | 3 refills | Status: AC

## 2022-02-26 NOTE — Addendum Note (Signed)
Addended by: Orland Mustard on: 02/26/2022 05:34 PM ? ? Modules accepted: Orders ? ?

## 2022-02-26 NOTE — Patient Instructions (Addendum)
Dr. Matthew Saras 08/24/22 mammogram due ? ? ?Asperecream with lidocaine or voltaren gel 4x per day  ?Tylenol  ? ? ?Back Exercises ?The following exercises strengthen the muscles that help to support the trunk (torso) and back. They also help to keep the lower back flexible. Doing these exercises can help to prevent or lessen existing low back pain. ?If you have back pain or discomfort, try doing these exercises 2-3 times each day or as told by your health care provider. ?As your pain improves, do them once each day, but increase the number of times that you repeat the steps for each exercise (do more repetitions). ?To prevent the recurrence of back pain, continue to do these exercises once each day or as told by your health care provider. ?Do exercises exactly as told by your health care provider and adjust them as directed. It is normal to feel mild stretching, pulling, tightness, or discomfort as you do these exercises, but you should stop right away if you feel sudden pain or your pain gets worse. ?Exercises ?Single knee to chest ?Repeat these steps 3-5 times for each leg: ?Lie on your back on a firm bed or the floor with your legs extended. ?Bring one knee to your chest. Your other leg should stay extended and in contact with the floor. ?Hold your knee in place by grabbing your knee or thigh with both hands and hold. ?Pull on your knee until you feel a gentle stretch in your lower back or buttocks. ?Hold the stretch for 10-30 seconds. ?Slowly release and straighten your leg. ? ?Pelvic tilt ?Repeat these steps 5-10 times: ?Lie on your back on a firm bed or the floor with your legs extended. ?Bend your knees so they are pointing toward the ceiling and your feet are flat on the floor. ?Tighten your lower abdominal muscles to press your lower back against the floor. This motion will tilt your pelvis so your tailbone points up toward the ceiling instead of pointing to your feet or the floor. ?With gentle tension and even  breathing, hold this position for 5-10 seconds. ? ?Cat-cow ?Repeat these steps until your lower back becomes more flexible: ?Get into a hands-and-knees position on a firm bed or the floor. Keep your hands under your shoulders, and keep your knees under your hips. You may place padding under your knees for comfort. ?Let your head hang down toward your chest. Contract your abdominal muscles and point your tailbone toward the floor so your lower back becomes rounded like the back of a cat. ?Hold this position for 5 seconds. ?Slowly lift your head, let your abdominal muscles relax, and point your tailbone up toward the ceiling so your back forms a sagging arch like the back of a cow. ?Hold this position for 5 seconds. ? ?Press-ups ?Repeat these steps 5-10 times: ?Lie on your abdomen (face-down) on a firm bed or the floor. ?Place your palms near your head, about shoulder-width apart. ?Keeping your back as relaxed as possible and keeping your hips on the floor, slowly straighten your arms to raise the top half of your body and lift your shoulders. Do not use your back muscles to raise your upper torso. You may adjust the placement of your hands to make yourself more comfortable. ?Hold this position for 5 seconds while you keep your back relaxed. ?Slowly return to lying flat on the floor. ? ?Bridges ?Repeat these steps 10 times: ?Lie on your back on a firm bed or the floor. ?Bend your  knees so they are pointing toward the ceiling and your feet are flat on the floor. Your arms should be flat at your sides, next to your body. ?Tighten your buttocks muscles and lift your buttocks off the floor until your waist is at almost the same height as your knees. You should feel the muscles working in your buttocks and the back of your thighs. If you do not feel these muscles, slide your feet 1-2 inches (2.5-5 cm) farther away from your buttocks. ?Hold this position for 3-5 seconds. ?Slowly lower your hips to the starting position, and  allow your buttocks muscles to relax completely. ?If this exercise is too easy, try doing it with your arms crossed over your chest. ?Abdominal crunches ?Repeat these steps 5-10 times: ?Lie on your back on a firm bed or the floor with your legs extended. ?Bend your knees so they are pointing toward the ceiling and your feet are flat on the floor. ?Cross your arms over your chest. ?Tip your chin slightly toward your chest without bending your neck. ?Tighten your abdominal muscles and slowly raise your torso high enough to lift your shoulder blades a tiny bit off the floor. Avoid raising your torso higher than that because it can put too much stress on your lower back and does not help to strengthen your abdominal muscles. ?Slowly return to your starting position. ? ?Back lifts ?Repeat these steps 5-10 times: ?Lie on your abdomen (face-down) with your arms at your sides, and rest your forehead on the floor. ?Tighten the muscles in your legs and your buttocks. ?Slowly lift your chest off the floor while you keep your hips pressed to the floor. Keep the back of your head in line with the curve in your back. Your eyes should be looking at the floor. ?Hold this position for 3-5 seconds. ?Slowly return to your starting position. ? ?Contact a health care provider if: ?Your back pain or discomfort gets much worse when you do an exercise. ?Your worsening back pain or discomfort does not lessen within 2 hours after you exercise. ?If you have any of these problems, stop doing these exercises right away. Do not do them again unless your health care provider says that you can. ?Get help right away if: ?You develop sudden, severe back pain. If this happens, stop doing the exercises right away. Do not do them again unless your health care provider says that you can. ?This information is not intended to replace advice given to you by your health care provider. Make sure you discuss any questions you have with your health care  provider. ?Document Revised: 04/10/2021 Document Reviewed: 12/27/2020 ?Elsevier Patient Education ? [place]. ? ?Hip Exercises ?Ask your health care provider which exercises are safe for you. Do exercises exactly as told by your health care provider and adjust them as directed. It is normal to feel mild stretching, pulling, tightness, or discomfort as you do these exercises. Stop right away if you feel sudden pain or your pain gets worse. Do not begin these exercises until told by your health care provider. ?Stretching and range-of-motion exercises ?These exercises warm up your muscles and joints and improve the movement and flexibility of your hip. These exercises also help to relieve pain, numbness, and tingling. You may be asked to limit your range of motion if you had a hip replacement. Talk to your health care provider about these restrictions. ?Hamstrings, supine ? ?Lie on your back (supine position). ?Loop a belt or towel  over the ball of your left / right foot. The ball of your foot is on the walking surface, right under your toes. ?Straighten your left / right knee and slowly pull on the belt or towel to raise your leg until you feel a gentle stretch behind your knee (hamstring). ?Do not let your knee bend while you do this. ?Keep your other leg flat on the floor. ?Hold this position for __________ seconds. ?Slowly return your leg to the starting position. ?Repeat __________ times. Complete this exercise __________ times a day. ?Hip rotation ? ?Lie on your back on a firm surface. ?With your left / right hand, gently pull your left / right knee toward the shoulder that is on the same side of the body. Stop when your knee is pointing toward the ceiling. ?Hold your left / right ankle with your other hand. ?Keeping your knee steady, gently pull your left / right ankle toward your other shoulder until you feel a stretch in your buttocks. ?Keep your hips and shoulders firmly planted while you do this  stretch. ?Hold this position for __________ seconds. ?Repeat __________ times. Complete this exercise __________ times a day. ?Seated stretch ?This exercise is sometimes called hamstrings and adductors stretc

## 2022-02-26 NOTE — Progress Notes (Addendum)
Chief Complaint  ?Patient presents with  ? Follow-up  ?  6 mon, declined any concerns or pain. Doing pretty good in general. Overdue for eye exam but will call office to schedule an appt.   ? ?6 month f/u  ?1. Palpitations controlled on lopressor 12.5 mg bid  ?2. C/o mild intermittent right low back pain and left hip pain declines Xrays for today  ? ? ?Review of Systems  ?Constitutional:  Negative for weight loss.  ?HENT:  Negative for hearing loss.   ?Eyes:  Negative for blurred vision.  ?Respiratory:  Negative for shortness of breath.   ?Cardiovascular:  Negative for chest pain.  ?Gastrointestinal:  Negative for abdominal pain and blood in stool.  ?Genitourinary:  Negative for dysuria.  ?Musculoskeletal:  Negative for falls and joint pain.  ?Skin:  Negative for rash.  ?Neurological:  Negative for headaches.  ?Psychiatric/Behavioral:  Negative for depression.   ?Past Medical History:  ?Diagnosis Date  ? Anxiety   ? Basal cell carcinoma 03/2019  ? right upper forehead, MOHS  ? Basal cell carcinoma 10/2012  ? Left upper back  ? COVID-19   ? 04/11/21 tx'ed paxlovid on call Dr. Scarlette Calico  ? Fatty liver 2010  ? GERD (gastroesophageal reflux disease)   ? H. pylori infection   ? Hemangioma 2010  ? Hiatal hernia   ? Hyperlipidemia   ? Hypertension   ? Liver hemangioma   ? ?Past Surgical History:  ?Procedure Laterality Date  ? bilateral lens replacement    ? CHOLECYSTECTOMY    ? COLONOSCOPY  2012  ? KNEE SURGERY  06/29/2007  ? Meniscus tear, left knee surgery  ? ?Family History  ?Problem Relation Age of Onset  ? Hypertension Sister   ? Skin cancer Sister   ? Hypertension Sister   ? Heart attack Sister   ?     53 y.o as of 09/10/19   ? Anxiety disorder Sister   ? Hypertension Brother   ? Colon cancer Neg Hx   ? Colon polyps Neg Hx   ? Esophageal cancer Neg Hx   ? Rectal cancer Neg Hx   ? Stomach cancer Neg Hx   ? ?Social History  ? ?Socioeconomic History  ? Marital status: Widowed  ?  Spouse name: Not on file  ? Number of  children: 2  ? Years of education: Not on file  ? Highest education level: Not on file  ?Occupational History  ? Occupation: Retired Garment/textile technologist  ?Tobacco Use  ? Smoking status: Never  ? Smokeless tobacco: Never  ?Vaping Use  ? Vaping Use: Never used  ?Substance and Sexual Activity  ? Alcohol use: No  ? Drug use: No  ? Sexual activity: Not Currently  ?  Birth control/protection: None  ?Other Topics Concern  ? Not on file  ?Social History Narrative  ? Has living will.  Widow as of 04/08/18.  Daughters have a copy of living will and [name] would be HPOA if husband was not able.  ? 1 daughter lives in [place] has 4 kids and 1 daughter in [place] with 2 kids  ?   ? Would desire CPR.  She would not want heroic measures.  ?   ?   ?   ? ?Social Determinants of Health  ? ?Financial Resource Strain: Low Risk   ? Difficulty of Paying Living Expenses: Not hard at all  ?Food Insecurity: No Food Insecurity  ? Worried About [employer] of  Food in the Last Year: Never true  ? Ran Out of Food in the Last Year: Never true  ?Transportation Needs: No Transportation Needs  ? Lack of Transportation (Medical): No  ? Lack of Transportation (Non-Medical): No  ?Physical Activity: Sufficiently Active  ? Days of Exercise per Week: 4 days  ? Minutes of Exercise per Session: 40 min  ?Stress: No Stress Concern Present  ? Feeling of Stress : Not at all  ?Social Connections: Unknown  ? Frequency of Communication with Friends and Family: More than three times a week  ? Frequency of Social Gatherings with Friends and Family: More than three times a week  ? Attends Religious Services: More than 4 times per year  ? Active Member of Clubs or Organizations: Not on file  ? Attends Archivist Meetings: Not on file  ? Marital Status: Widowed  ?Intimate Partner Violence: Not At Risk  ? Fear of Current or Ex-Partner: No  ? Emotionally Abused: No  ? Physically Abused: No  ? Sexually Abused: No  ? ?Current Meds   ?Medication Sig  ? Ascorbic Acid (VITAMIN C) 500 MG tablet Take 500 mg by mouth daily.  ? aspirin EC 81 MG tablet Take 81 mg by mouth daily.  ? azelastine (ASTELIN) 0.1 % nasal spray Place 2 sprays into both nostrils as needed. Use in each nostril as directed  ? Calcium Carbonate-Vitamin D (CALCIUM 600/VITAMIN D PO) Take 1 tablet by mouth 2 (two) times daily.  ? Diclofenac Sodium (VOLTAREN EX) Apply topically as needed.  ? fish oil-omega-3 fatty acids 1000 MG capsule Take 2 g by mouth daily.  ? fluticasone (FLONASE) 50 MCG/ACT nasal spray Place into both nostrils daily.  ? melatonin 5 MG TABS Take 5 mg by mouth at bedtime as needed.  ? vitamin E 400 UNIT capsule Take 400 Units by mouth daily.  ? [DISCONTINUED] albuterol (VENTOLIN HFA) 108 (90 Base) MCG/ACT inhaler INHALE 1 TO 2 PUFFS INTO THE LUNGS EVERY 6 HOURS AS NEEDED FOR WHEEZING OR SHORTNESS OF BREATH OR COUGH  ? [DISCONTINUED] ezetimibe (ZETIA) 10 MG tablet TAKE 1 TABLET(10 MG) BY MOUTH DAILY  ? [DISCONTINUED] metoprolol tartrate (LOPRESSOR) 25 MG tablet TAKE 1/2 TABLET BY MOUTH TWICE DAILY  ? [DISCONTINUED] oseltamivir (TAMIFLU) 75 MG capsule Take 1 capsule (75 mg total) by mouth 2 (two) times daily. X 5 days  ? [DISCONTINUED] pantoprazole (PROTONIX) 40 MG tablet Take 1 tablet (40 mg total) by mouth daily. 30 min before food  ? [DISCONTINUED] promethazine-dextromethorphan (PROMETHAZINE-DM) 6.25-15 MG/5ML syrup Take 5 mLs by mouth 4 (four) times daily as needed for cough.  ? [DISCONTINUED] simvastatin (ZOCOR) 40 MG tablet TAKE 1 TABLET(40 MG) BY MOUTH DAILY  ? ?Allergies  ?Allergen Reactions  ? Dexilant [Dexlansoprazole] Diarrhea  ? Sulfonamide Derivatives   ?  REACTION: as child  ? ?Recent Results (from the past 2160 hour(s))  ?Covid-19, Flu A+B ([facility])     Status: None  ? Collection Time: 01/22/22  1:20 PM  ? Specimen: Nasopharyngeal  ? Naso  ?Result Value Ref Range  ? SARS-CoV-2, NAA Not Detected Not Detected  ? Influenza A, NAA Not Detected Not  Detected  ? Influenza B, NAA Not Detected Not Detected  ? Test Information: Comment   ?  Comment: This nucleic acid amplification test was developed and its performance ?characteristics determined by [employer]. Nucleic acid ?amplification tests include RT-PCR and TMA. This test has not been ?FDA cleared or approved. This test has been authorized  by FDA under ?an Emergency Use Authorization (EUA). This test is only authorized ?for the duration of time the declaration that circumstances exist ?justifying the authorization of the emergency use of in vitro ?diagnostic tests for detection of SARS-CoV-2 virus and/or diagnosis ?of COVID-19 infection under section 564(b)(1) of the Act, 21 U.S.C. ?360bbb-3(b) (1), unless the authorization is terminated or revoked ?sooner. ?When diagnostic testing is negative, the possibility of a false ?negative result should be considered in the context of a patient's ?recent exposures and the presence of clinical signs and symptoms ?consistent with COVID-19. An individual without symptoms of COVID-19 ?and who is not shedding SARS-CoV-2 virus wo uld expect to have a ?negative (not detected) result in this assay. ?  ? ?Objective  ?Body mass index is 33.71 kg/m?. ?Wt Readings from Last 3 Encounters:  ?02/26/22 178 lb 6.4 oz (80.9 kg)  ?12/03/21 179 lb (81.2 kg)  ?11/16/21 179 lb (81.2 kg)  ? ?Temp Readings from Last 3 Encounters:  ?02/26/22 97.6 ?F (36.4 ?C) (Oral)  ?01/22/22 99.4 ?F (37.4 ?C) (Oral)  ?11/16/21 97.9 ?F (36.6 ?C)  ? ?BP Readings from Last 3 Encounters:  ?02/26/22 126/80  ?01/22/22 137/81  ?11/16/21 (!) 132/56  ? ?Pulse Readings from Last 3 Encounters:  ?02/26/22 66  ?01/22/22 (!) 104  ?11/16/21 70  ? ? ?Physical Exam ?Vitals and nursing note reviewed.  ?Constitutional:   ?   Appearance: Normal appearance. She is well-developed and well-groomed.  ?HENT:  ?   Head: Normocephalic and atraumatic.  ?Eyes:  ?   Conjunctiva/sclera: Conjunctivae normal.  ?   Pupils: Pupils  are equal, round, and reactive to light.  ?Cardiovascular:  ?   Rate and Rhythm: Normal rate and regular rhythm.  ?   Heart sounds: Normal heart sounds. No murmur heard. ?Pulmonary:  ?   Effort: Pulmonary effort is no

## 2022-02-27 LAB — URINALYSIS, ROUTINE W REFLEX MICROSCOPIC
Bacteria, UA: NONE SEEN /HPF
Bilirubin Urine: NEGATIVE
Glucose, UA: NEGATIVE
Hyaline Cast: NONE SEEN /LPF
Ketones, ur: NEGATIVE
Nitrite: NEGATIVE
Protein, ur: NEGATIVE
RBC / HPF: NONE SEEN /HPF (ref 0–2)
Specific Gravity, Urine: 1.01 (ref 1.001–1.035)
WBC, UA: NONE SEEN /HPF (ref 0–5)
pH: 6 (ref 5.0–8.0)

## 2022-02-27 LAB — MICROSCOPIC MESSAGE

## 2022-02-27 LAB — URINE CULTURE
MICRO NUMBER:: 13340066
Result:: NO GROWTH
SPECIMEN QUALITY:: ADEQUATE

## 2022-02-28 DIAGNOSIS — R319 Hematuria, unspecified: Secondary | ICD-10-CM

## 2022-02-28 HISTORY — DX: Hematuria, unspecified: R31.9

## 2022-02-28 NOTE — Addendum Note (Signed)
Addended by: Orland Mustard on: 02/28/2022 05:47 PM ? ? Modules accepted: Orders ? ?

## 2022-03-12 ENCOUNTER — Telehealth: Payer: Self-pay | Admitting: Internal Medicine

## 2022-03-12 NOTE — Telephone Encounter (Signed)
Lft pt vm on both numbers to cal ofc . thanks ?

## 2022-03-14 ENCOUNTER — Other Ambulatory Visit (INDEPENDENT_AMBULATORY_CARE_PROVIDER_SITE_OTHER): Payer: Medicare PPO

## 2022-03-14 DIAGNOSIS — R7989 Other specified abnormal findings of blood chemistry: Secondary | ICD-10-CM | POA: Diagnosis not present

## 2022-03-14 LAB — BASIC METABOLIC PANEL
BUN: 14 mg/dL (ref 6–23)
CO2: 25 mEq/L (ref 19–32)
Calcium: 8.7 mg/dL (ref 8.4–10.5)
Chloride: 106 mEq/L (ref 96–112)
Creatinine, Ser: 0.74 mg/dL (ref 0.40–1.20)
GFR: 78 mL/min (ref >60.00)
Glucose, Bld: 92 mg/dL (ref 70–99)
Potassium: 3.8 mEq/L (ref 3.5–5.1)
Sodium: 138 mEq/L (ref 135–145)

## 2022-03-15 LAB — MICROALBUMIN / CREATININE URINE RATIO
Creatinine, Urine: 59 mg/dL (ref 20–275)
Microalb Creat Ratio: 17 mcg/mg creat (ref <30)
Microalb, Ur: 1 mg/dL

## 2022-03-15 LAB — SODIUM, URINE, RANDOM: Sodium, Ur: 15 mmol/L — ABNORMAL LOW (ref 28–272)

## 2022-03-19 ENCOUNTER — Ambulatory Visit
Admission: RE | Admit: 2022-03-19 | Discharge: 2022-03-19 | Disposition: A | Discharge status: Home / Self Care | Payer: Medicare PPO | Source: Ambulatory Visit | Attending: Internal Medicine | Admitting: Internal Medicine

## 2022-03-19 DIAGNOSIS — K5730 Diverticulosis of large intestine without perforation or abscess without bleeding: Secondary | ICD-10-CM | POA: Diagnosis not present

## 2022-03-19 DIAGNOSIS — I700 Atherosclerosis of aorta: Secondary | ICD-10-CM | POA: Insufficient documentation

## 2022-03-19 DIAGNOSIS — N3289 Other specified disorders of bladder: Secondary | ICD-10-CM | POA: Diagnosis not present

## 2022-03-19 DIAGNOSIS — K8689 Other specified diseases of pancreas: Secondary | ICD-10-CM | POA: Diagnosis not present

## 2022-03-19 DIAGNOSIS — R7989 Other specified abnormal findings of blood chemistry: Secondary | ICD-10-CM | POA: Diagnosis not present

## 2022-03-19 DIAGNOSIS — R319 Hematuria, unspecified: Secondary | ICD-10-CM | POA: Diagnosis not present

## 2022-03-20 ENCOUNTER — Encounter: Payer: Self-pay | Admitting: Internal Medicine

## 2022-03-20 DIAGNOSIS — I728 Aneurysm of other specified arteries: Secondary | ICD-10-CM | POA: Insufficient documentation

## 2022-03-20 DIAGNOSIS — K5790 Diverticulosis of intestine, part unspecified, without perforation or abscess without bleeding: Secondary | ICD-10-CM | POA: Insufficient documentation

## 2022-04-04 ENCOUNTER — Ambulatory Visit: Payer: Medicare PPO | Admitting: Physician Assistant

## 2022-04-04 ENCOUNTER — Encounter: Payer: Self-pay | Admitting: *Deleted

## 2022-04-04 VITALS — BP 124/76 | HR 67 | Ht 61.0 in | Wt 179.0 lb

## 2022-04-04 DIAGNOSIS — R933 Abnormal findings on diagnostic imaging of other parts of digestive tract: Secondary | ICD-10-CM | POA: Diagnosis not present

## 2022-04-04 DIAGNOSIS — K219 Gastro-esophageal reflux disease without esophagitis: Secondary | ICD-10-CM

## 2022-04-04 MED ORDER — PANTOPRAZOLE SODIUM 40 MG PO TBEC: 40.0000 mg | DELAYED_RELEASE_TABLET | Freq: Two times a day (BID) | ORAL | 2 refills | Status: DC

## 2022-04-04 NOTE — Progress Notes (Signed)
Chief Complaint: Abnormal CT of the esophagus  HPI:    Leah Melendez is a 76 year old female with a past medical history as listed below including reflux, known to Dr. Tarri Glenn, who was referred to me by McLean-Scocuzza, Leah Melendez * for a complaint of abnormal CT of the esophagus.      11/16/2021 colonoscopy with one 3 mm polyp at the hepatic flexure, one 2 mm polyp in the cecum and otherwise normal.  Pathology showed tubular adenomas.    03/19/2022 CT of the abdomen pelvis without contrast done for gross hematuria and right-sided groin pain.  This showed a moderate-sized hiatal hernia with asymmetric wall thickening of the herniated portion of the stomach which was thought possibly due to reflux or esophagitis and recommended endoscopy moderate volume of colonic stool most prominent in the right colon and sigmoid diverticulosis.    Today, the patient tells me that she does have reflux symptoms which are quite severe about 2 times a week and sometimes will wake up with a feeling of reflux and epigastric pain.  Oftentimes if she sleeps later than 7 in the morning she will have reflux in her bed.  Tells me she has to sleep with pillows propped up and is aware that certain foods trigger her symptoms but "I like chocolate and spaghetti".  Currently on Pantoprazole 40 mg daily.    Denies fever, chills, weight loss or dysphagia.    Past Medical History:  Diagnosis Date   Anxiety    Aortic atherosclerosis ([place])    Basal cell carcinoma 03/2019   right upper forehead, MOHS   Basal cell carcinoma 10/2012   Left upper back   COVID-19    04/11/21 tx'ed paxlovid on call Dr. Scarlette Calico   Diverticulosis    Fatty liver 2010   GERD (gastroesophageal reflux disease)    H. pylori infection    Hemangioma 2010   Hiatal hernia    Hyperlipidemia    Hypertension    Liver hemangioma    Splenic artery aneurysm (HCC)    Tubular adenoma of colon     Past Surgical History:  Procedure Laterality Date   bilateral  lens replacement     CHOLECYSTECTOMY     COLONOSCOPY  2012   KNEE SURGERY  06/29/2007   Meniscus tear, left knee surgery    Current Outpatient Medications  Medication Sig Dispense Refill   albuterol (VENTOLIN HFA) 108 (90 Base) MCG/ACT inhaler INHALE 1 TO 2 PUFFS INTO THE LUNGS EVERY 6 HOURS AS NEEDED FOR WHEEZING OR SHORTNESS OF BREATH OR COUGH 54 g 3   Ascorbic Acid (VITAMIN C) 500 MG tablet Take 500 mg by mouth daily.     aspirin EC 81 MG tablet Take 81 mg by mouth daily.     azelastine (ASTELIN) 0.1 % nasal spray Place 2 sprays into both nostrils as needed. Use in each nostril as directed     Calcium Carbonate-Vitamin D (CALCIUM 600/VITAMIN D PO) Take 1 tablet by mouth 2 (two) times daily.     Diclofenac Sodium (VOLTAREN EX) Apply topically as needed.     ezetimibe (ZETIA) 10 MG tablet TAKE 1 TABLET(10 MG) BY MOUTH DAILY 90 tablet 3   fish oil-omega-3 fatty acids 1000 MG capsule Take 2 g by mouth daily.     fluticasone (FLONASE) 50 MCG/ACT nasal spray Place into both nostrils daily.     melatonin 5 MG TABS Take 5 mg by mouth at bedtime as needed.     metoprolol tartrate (  LOPRESSOR) 25 MG tablet Take 0.5 tablets (12.5 mg total) by mouth 2 (two) times daily. 90 tablet 3   pantoprazole (PROTONIX) 40 MG tablet Take 1 tablet (40 mg total) by mouth daily. 30 min before food 90 tablet 3   simvastatin (ZOCOR) 40 MG tablet TAKE 1 TABLET(40 MG) BY MOUTH DAILY 90 tablet 3   vitamin E 400 UNIT capsule Take 400 Units by mouth daily.     ALPRAZolam (XANAX) 0.25 MG tablet Take 0.5 mg by mouth 2 (two) times daily as needed.  (Patient not taking: Reported on 04/04/2022)     No current facility-administered medications for this visit.    Allergies as of 04/04/2022 - Review Complete 04/04/2022  Allergen Reaction Noted   Dexilant [dexlansoprazole] Diarrhea 08/19/2013   Sulfonamide derivatives      Family History  Problem Relation Age of Onset   Hypertension Sister    Skin cancer Sister     Hypertension Sister    Heart attack Sister        1 y.o as of 09/10/19    Anxiety disorder Sister    Hypertension Brother    Colon cancer Neg Hx    Colon polyps Neg Hx    Esophageal cancer Neg Hx    Rectal cancer Neg Hx    Stomach cancer Neg Hx     Social History   Socioeconomic History   Marital status: Widowed    Spouse name: Not on file   Number of children: 2   Years of education: Not on file   Highest education level: Not on file  Occupational History   Occupation: Retired Garment/textile technologist  Tobacco Use   Smoking status: Never   Smokeless tobacco: Never  Vaping Use   Vaping Use: Never used  Substance and Sexual Activity   Alcohol use: No   Drug use: No   Sexual activity: Not Currently    Birth control/protection: None  Other Topics Concern   Not on file  Social History Narrative   Has living will.  Widow as of 04/08/18.  Daughters have a copy of living will and [name] would be HPOA if husband was not able.   1 daughter lives in [place] has 4 kids and 1 daughter in [place] with 2 kids      Would desire CPR.  She would not want heroic measures.            Social Determinants of Health   Financial Resource Strain: Low Risk  (12/03/2021)   Overall Financial Resource Strain (CARDIA)    Difficulty of Paying Living Expenses: Not hard at all  Food Insecurity: No Food Insecurity (12/03/2021)   Hunger Vital Sign    Worried About Running Out of Food in the Last Year: Never true    Ran Out of Food in the Last Year: Never true  Transportation Needs: No Transportation Needs (12/03/2021)   PRAPARE - Hydrologist (Medical): No    Lack of Transportation (Non-Medical): No  Physical Activity: Sufficiently Active (12/03/2021)   Exercise Vital Sign    Days of Exercise per Week: 4 days    Minutes of Exercise per Session: 40 min  Stress: No Stress Concern Present (12/03/2021)   [place]    Feeling of Stress : Not at all  Social Connections: Unknown (12/03/2021)   Social Connection and Isolation Panel [NHANES]    Frequency of Communication with Friends  and Family: More than three times a week    Frequency of Social Gatherings with Friends and Family: More than three times a week    Attends Religious Services: More than 4 times per year    Active Member of [employer] or Organizations: Not on file    Attends Archivist Meetings: Not on file    Marital Status: Widowed  Intimate Partner Violence: Not At Risk (12/03/2021)   Humiliation, Afraid, Rape, and Kick questionnaire    Fear of Current or Ex-Partner: No    Emotionally Abused: No    Physically Abused: No    Sexually Abused: No    Review of Systems:    Constitutional: No weight loss, fever or chills Cardiovascular: No chest pain Respiratory: No SOB  Gastrointestinal: See HPI and otherwise negative   Physical Exam:  Vital signs: BP 124/76   Pulse 67   Ht '5\' 1"'$  (1.549 m)   Wt 179 lb (81.2 kg)   SpO2 97%   BMI 33.82 kg/m    Constitutional:   Pleasant Elderly Caucasian female appears to be in NAD, Well developed, Well nourished, alert and cooperative Respiratory: Respirations even and unlabored. Lungs clear to auscultation bilaterally.   No wheezes, crackles, or rhonchi.  Cardiovascular: Normal S1, S2. No MRG. Regular rate and rhythm. No peripheral edema, cyanosis or pallor.  Gastrointestinal:  Soft, nondistended, nontender. No rebound or guarding. Normal bowel sounds. No appreciable masses or hepatomegaly. Rectal:  Not performed.  Psychiatric: Oriented to person, place and time. Demonstrates good judgement and reason without abnormal affect or behaviors.  RELEVANT LABS AND IMAGING: CBC    Component Value Date/Time   WBC 5.7 02/26/2022 0943   RBC 4.55 02/26/2022 0943   HGB 14.0 02/26/2022 0943   HGB 13.4 06/07/2013 1616   HCT 42.2 02/26/2022 0943   HCT 37.8 06/07/2013 1616   PLT  178.0 02/26/2022 0943   PLT 179 06/07/2013 1616   MCV 92.7 02/26/2022 0943   MCV 90 06/07/2013 1616   MCH 31.8 09/11/2019 0611   MCHC 33.3 02/26/2022 0943   RDW 12.4 02/26/2022 0943   RDW 12.4 06/07/2013 1616   LYMPHSABS 1.3 02/26/2022 0943   MONOABS 0.5 02/26/2022 0943   EOSABS 0.1 02/26/2022 0943   BASOSABS 0.0 02/26/2022 0943    CMP     Component Value Date/Time   NA 138 03/14/2022 1056   NA 139 06/07/2013 1616   K 3.8 03/14/2022 1056   K 4.2 06/07/2013 1616   CL 106 03/14/2022 1056   CL 108 (H) 06/07/2013 1616   CO2 25 03/14/2022 1056   CO2 26 06/07/2013 1616   GLUCOSE 92 03/14/2022 1056   GLUCOSE 96 06/07/2013 1616   BUN 14 03/14/2022 1056   BUN 13 06/07/2013 1616   CREATININE 0.74 03/14/2022 1056   CREATININE 0.77 06/07/2013 1616   CALCIUM 8.7 03/14/2022 1056   CALCIUM 9.1 06/07/2013 1616   PROT 7.0 02/26/2022 0943   ALBUMIN 4.1 02/26/2022 0943   AST 16 02/26/2022 0943   ALT 11 02/26/2022 0943   ALKPHOS 68 02/26/2022 0943   BILITOT 1.5 (H) 02/26/2022 0943   GFRNONAA >60 09/11/2019 0611   GFRNONAA >60 06/07/2013 1616   GFRAA >60 09/11/2019 0611   GFRAA >60 06/07/2013 1616    Assessment: 1.  Abnormal CT of the esophagus: Showing thickening in her esophagus, likely related to below 2.  GERD: Breakthrough symptoms at least 2-3 times a week which are "severe" per the patient, regardless of  Pantoprazole 40 mg daily  Plan: 1.  Schedule patient for diagnostic EGD in the [facility] given abnormal CT of the esophagus and reflux symptoms.  This is scheduled with Dr. Tarri Glenn.  Did provide the patient a detailed list of risks for the procedure and she agrees to proceed. 2.  Increased Pantoprazole to 40 mg twice a day, 30-60 minutes before breakfast and dinner.  Prescribed #60 with 3 refills. 3.  Reviewed antireflux diet and lifestyle modifications. 4.  Patient follow in clinic per recommendations after time of EGD  Ellouise Newer, PA-C [facility] 04/04/2022,  2:54 PM  Cc: McLean-Scocuzza, Leah Melendez *

## 2022-04-04 NOTE — Patient Instructions (Signed)
You have been scheduled for an endoscopy. Please follow written instructions given to you at your visit today. If you use inhalers (even only as needed), please bring them with you on the day of your procedure.  We have sent the following medications to your pharmacy for you to pick up at your convenience: Pantoprazole 40 mg twice daily (increase from previous dosing)  If you are age 78 or older, your body mass index should be between 23-30. Your Body mass index is 33.82 kg/m. If this is out of the aforementioned range listed, please consider follow up with your Primary Care Provider.  If you are age 58 or younger, your body mass index should be between 19-25. Your Body mass index is 33.82 kg/m. If this is out of the aformentioned range listed, please consider follow up with your Primary Care Provider.   ________________________________________________________  The [facility] providers would like to encourage you to use [facility] to communicate with providers for non-urgent requests or questions.  Due to long hold times on the telephone, sending your provider a message by [facility] may be a faster and more efficient way to get a response.  Please allow 48 business hours for a response.  Please remember that this is for non-urgent requests.  _______________________________________________________ Due to recent changes in healthcare laws, you may see the results of your imaging and laboratory studies on MyChart before your provider has had a chance to review them.  We understand that in some cases there may be results that are confusing or concerning to you. Not all laboratory results come back in the same time frame and the provider may be waiting for multiple results in order to interpret others.  Please give [place] 48 hours in order for your provider to thoroughly review all the results before contacting the office for clarification of your results.

## 2022-04-05 ENCOUNTER — Telehealth: Payer: Self-pay

## 2022-04-05 NOTE — Progress Notes (Signed)
Reviewed and agree with management plans. EGD recommended. Will place on cancellation list in the event that an earlier appointment becomes available.   Kassiah Mccrory L. Tarri Glenn, MD, MPH

## 2022-04-05 NOTE — Telephone Encounter (Signed)
Patient was previously scheduled for EGD while in [place]. Patient has been placed on wait list in case a sooner appointment becomes available.

## 2022-04-22 ENCOUNTER — Encounter: Payer: Self-pay | Admitting: Gastroenterology

## 2022-04-22 ENCOUNTER — Ambulatory Visit (AMBULATORY_SURGERY_CENTER): Payer: Medicare PPO | Admitting: Gastroenterology

## 2022-04-22 ENCOUNTER — Telehealth: Payer: Self-pay

## 2022-04-22 VITALS — BP 153/73 | HR 62 | Temp 97.7°F | Resp 12 | Ht 61.0 in | Wt 179.0 lb

## 2022-04-22 DIAGNOSIS — K317 Polyp of stomach and duodenum: Secondary | ICD-10-CM | POA: Diagnosis not present

## 2022-04-22 DIAGNOSIS — R933 Abnormal findings on diagnostic imaging of other parts of digestive tract: Secondary | ICD-10-CM

## 2022-04-22 DIAGNOSIS — K449 Diaphragmatic hernia without obstruction or gangrene: Secondary | ICD-10-CM

## 2022-04-22 DIAGNOSIS — K760 Fatty (change of) liver, not elsewhere classified: Secondary | ICD-10-CM | POA: Diagnosis not present

## 2022-04-22 DIAGNOSIS — K2950 Unspecified chronic gastritis without bleeding: Secondary | ICD-10-CM | POA: Diagnosis not present

## 2022-04-22 DIAGNOSIS — F419 Anxiety disorder, unspecified: Secondary | ICD-10-CM | POA: Diagnosis not present

## 2022-04-22 DIAGNOSIS — I10 Essential (primary) hypertension: Secondary | ICD-10-CM | POA: Diagnosis not present

## 2022-04-22 DIAGNOSIS — K2970 Gastritis, unspecified, without bleeding: Secondary | ICD-10-CM | POA: Diagnosis not present

## 2022-04-22 DIAGNOSIS — E785 Hyperlipidemia, unspecified: Secondary | ICD-10-CM | POA: Diagnosis not present

## 2022-04-22 MED ORDER — SODIUM CHLORIDE 0.9 % IV SOLN: 500.0000 mL | Freq: Once | INTRAVENOUS | Status: DC

## 2022-04-22 NOTE — Telephone Encounter (Signed)
Patient scheduled for follow up with Victorino Dike, PA on 05/20/22 at 11:00 am. Patient notified via mychart.

## 2022-04-23 ENCOUNTER — Telehealth: Payer: Self-pay | Admitting: *Deleted

## 2022-05-01 ENCOUNTER — Encounter: Payer: Self-pay | Admitting: Gastroenterology

## 2022-05-20 ENCOUNTER — Ambulatory Visit: Payer: Medicare PPO | Admitting: Physician Assistant

## 2022-05-20 ENCOUNTER — Encounter: Payer: Self-pay | Admitting: Physician Assistant

## 2022-05-20 VITALS — BP 122/68 | HR 71 | Ht 61.0 in | Wt 179.4 lb

## 2022-05-20 DIAGNOSIS — K219 Gastro-esophageal reflux disease without esophagitis: Secondary | ICD-10-CM | POA: Diagnosis not present

## 2022-05-20 DIAGNOSIS — K2950 Unspecified chronic gastritis without bleeding: Secondary | ICD-10-CM

## 2022-05-20 NOTE — Progress Notes (Signed)
Chief Complaint: Follow-up EGD  HPI:    Leah Melendez is a 76 year old female with a past medical history as listed below including reflux, known to Dr. Tarri Glenn, who returns to clinic today for follow-up after recent EGD done for abnormal CT of the esophagus.    11/16/2021 colonoscopy with one 3 mm polyp at the hepatic flexure, one 2 mm polyp in the cecum and otherwise normal.  Pathology showed tubular adenomas.    03/19/2022 CT of the abdomen pelvis without contrast done for gross hematuria and right-sided groin pain.  This showed a moderate-sized hiatal hernia with asymmetric wall thickening of the herniated portion of the stomach which was thought possibly due to reflux or esophagitis and recommended endoscopy moderate volume of colonic stool most prominent in the right colon and sigmoid diverticulosis.    04/04/2022 patient saw me and discussed severe reflux symptoms and was scheduled for an EGD.  Increase Pantoprazole to 40 twice a day.    04/22/2022 EGD with Dr. Tarri Glenn with medium sized hiatal hernia, erythematous mucosa in the gastric body and multiple gastric polyps.  Pathology showed minimal chronic gastritis.  Also fundic gland polyps.    Today, the patient tells me that she is doing well.  She still is taking her Pantoprazole 40 mg twice a day which has helped a lot with her breakthrough reflux symptoms.  She has also been trying to do better with her diet and not eating trigger foods as well as not eating late into the night and really she has had no problems with reflux at all over the past few months.  Does tell me in general she is trying to be better about drinking her water and living healthier and she feels better.    Denies fever, chills, weight loss or blood in her stool.  Past Medical History:  Diagnosis Date   Anxiety    Aortic atherosclerosis ([place])    Basal cell carcinoma 03/2019   right upper forehead, MOHS   Basal cell carcinoma 10/2012   Left upper back   COVID-19     04/11/21 tx'ed paxlovid on call Dr. Scarlette Calico   Diverticulosis    Fatty liver 2010   GERD (gastroesophageal reflux disease)    H. pylori infection    Hemangioma 2010   Hiatal hernia    Hyperlipidemia    Hypertension    Liver hemangioma    Splenic artery aneurysm ([facility]CC)    Tubular adenoma of colon     Past Surgical History:  Procedure Laterality Date   bilateral lens replacement     CHOLECYSTECTOMY     COLONOSCOPY  2012   KNEE SURGERY  06/29/2007   Meniscus tear, left knee surgery   UPPER GASTROINTESTINAL ENDOSCOPY      Current Outpatient Medications  Medication Sig Dispense Refill   albuterol (VENTOLIN HFA) 108 (90 Base) MCG/ACT inhaler INHALE 1 TO 2 PUFFS INTO THE LUNGS EVERY 6 HOURS AS NEEDED FOR WHEEZING OR SHORTNESS OF BREATH OR COUGH 54 g 3   ALPRAZolam (XANAX) 0.25 MG tablet Take 0.5 mg by mouth 2 (two) times daily as needed.  (Patient not taking: Reported on 04/04/2022)     Ascorbic Acid (VITAMIN C) 500 MG tablet Take 500 mg by mouth daily.     aspirin EC 81 MG tablet Take 81 mg by mouth daily.     azelastine (ASTELIN) 0.1 % nasal spray Place 2 sprays into both nostrils as needed. Use in each nostril as directed  Calcium Carbonate-Vitamin D (CALCIUM 600/VITAMIN D PO) Take 1 tablet by mouth 2 (two) times daily.     Diclofenac Sodium (VOLTAREN EX) Apply topically as needed.     ezetimibe (ZETIA) 10 MG tablet TAKE 1 TABLET(10 MG) BY MOUTH DAILY 90 tablet 3   fish oil-omega-3 fatty acids 1000 MG capsule Take 2 g by mouth daily.     fluticasone (FLONASE) 50 MCG/ACT nasal spray Place into both nostrils daily.     melatonin 5 MG TABS Take 5 mg by mouth at bedtime as needed.     metoprolol tartrate (LOPRESSOR) 25 MG tablet Take 0.5 tablets (12.5 mg total) by mouth 2 (two) times daily. 90 tablet 3   pantoprazole (PROTONIX) 40 MG tablet Take 1 tablet (40 mg total) by mouth 2 (two) times daily before a meal. 30 min before food 60 tablet 2   simvastatin (ZOCOR) 40 MG tablet TAKE 1  TABLET(40 MG) BY MOUTH DAILY 90 tablet 3   vitamin E 400 UNIT capsule Take 400 Units by mouth daily.     No current facility-administered medications for this visit.    Allergies as of 05/20/2022 - Review Complete 04/22/2022  Allergen Reaction Noted   Dexilant [dexlansoprazole] Diarrhea 08/19/2013   Sulfonamide derivatives      Family History  Problem Relation Age of Onset   Hypertension Sister    Skin cancer Sister    Hypertension Sister    Heart attack Sister        40 y.o as of 09/10/19    Anxiety disorder Sister    Hypertension Brother    Colon cancer Neg Hx    Colon polyps Neg Hx    Esophageal cancer Neg Hx    Rectal cancer Neg Hx    Stomach cancer Neg Hx     Social History   Socioeconomic History   Marital status: Widowed    Spouse name: Not on file   Number of children: 2   Years of education: Not on file   Highest education level: Not on file  Occupational History   Occupation: Retired Garment/textile technologist  Tobacco Use   Smoking status: Never   Smokeless tobacco: Never  Vaping Use   Vaping Use: Never used  Substance and Sexual Activity   Alcohol use: No   Drug use: No   Sexual activity: Not Currently    Birth control/protection: None  Other Topics Concern   Not on file  Social History Narrative   Has living will.  Widow as of 04/08/18.  Daughters have a copy of living will and [name] would be HPOA if husband was not able.   1 daughter lives in [place] has 4 kids and 1 daughter in [place] with 2 kids      Would desire CPR.  She would not want heroic measures.            Social Determinants of Health   Financial Resource Strain: Low Risk  (12/03/2021)   Overall Financial Resource Strain (CARDIA)    Difficulty of Paying Living Expenses: Not hard at all  Food Insecurity: No Food Insecurity (12/03/2021)   Hunger Vital Sign    Worried About Running Out of Food in the Last Year: Never true    Ran Out of Food in the Last Year: Never  true  Transportation Needs: No Transportation Needs (12/03/2021)   PRAPARE - Hydrologist (Medical): No    Lack of Transportation (Non-Medical): No  Physical Activity: Sufficiently Active (12/03/2021)   Exercise Vital Sign    Days of Exercise per Week: 4 days    Minutes of Exercise per Session: 40 min  Stress: No Stress Concern Present (12/03/2021)   [place]    Feeling of Stress : Not at all  Social Connections: Unknown (12/03/2021)   Social Connection and Isolation Panel [NHANES]    Frequency of Communication with Friends and Family: More than three times a week    Frequency of Social Gatherings with Friends and Family: More than three times a week    Attends Religious Services: More than 4 times per year    Active Member of [employer] or Organizations: Not on file    Attends Archivist Meetings: Not on file    Marital Status: Widowed  Intimate Partner Violence: Not At Risk (12/03/2021)   Humiliation, Afraid, Rape, and Kick questionnaire    Fear of Current or Ex-Partner: No    Emotionally Abused: No    Physically Abused: No    Sexually Abused: No    Review of Systems:    Constitutional: No weight loss, fever or chills Cardiovascular: No chest pain Respiratory: No SOB  Gastrointestinal: See HPI and otherwise negative   Physical Exam:  Vital signs: BP 122/68   Pulse 71   Ht '5\' 1"'$  (1.549 m)   Wt 179 lb 6.4 oz (81.4 kg)   SpO2 98%   BMI 33.90 kg/m   Constitutional:   Pleasant Caucasian female appears to be in NAD, Well developed, Well nourished, alert and cooperative Respiratory: Respirations even and unlabored. Lungs clear to auscultation bilaterally.   No wheezes, crackles, or rhonchi.  Cardiovascular: Normal S1, S2. No MRG. Regular rate and rhythm. No peripheral edema, cyanosis or pallor.  Gastrointestinal:  Soft, nondistended, nontender. No rebound or guarding. Normal bowel  sounds. No appreciable masses or hepatomegaly. Rectal:  Not performed.  Psychiatric: Demonstrates good judgement and reason without abnormal affect or behaviors.  No recent labs.  Assessment: 1.  GERD and gastritis: Controlled on Pantoprazole 40 mg twice daily and diet and lifestyle modification, recent EGD as above  Plan: 1.  Reviewed recent EGD and findings of gastritis.  Discussed with patient that at this point if she would like to try coming down off of her Pantoprazole 40 mg she can go down to once daily dosing every other day for a week and then once daily from there.  If she has breakthrough reflux symptoms she can always use twice daily medicine and should call when she needs a refill. 2.  Reviewed antireflux diet and lifestyle modifications again. 3.  Patient to follow in clinic with [place] as needed.  Ellouise Newer, PA-C [facility] 05/20/2022, 10:56 AM  Cc: McLean-Scocuzza, [name] *

## 2022-05-22 DIAGNOSIS — K219 Gastro-esophageal reflux disease without esophagitis: Secondary | ICD-10-CM | POA: Diagnosis not present

## 2022-05-22 DIAGNOSIS — E785 Hyperlipidemia, unspecified: Secondary | ICD-10-CM | POA: Diagnosis not present

## 2022-05-22 DIAGNOSIS — M8580 Other specified disorders of bone density and structure, unspecified site: Secondary | ICD-10-CM | POA: Diagnosis not present

## 2022-05-22 DIAGNOSIS — E669 Obesity, unspecified: Secondary | ICD-10-CM | POA: Diagnosis not present

## 2022-05-22 DIAGNOSIS — J45909 Unspecified asthma, uncomplicated: Secondary | ICD-10-CM | POA: Diagnosis not present

## 2022-05-22 DIAGNOSIS — I10 Essential (primary) hypertension: Secondary | ICD-10-CM | POA: Diagnosis not present

## 2022-05-22 DIAGNOSIS — G629 Polyneuropathy, unspecified: Secondary | ICD-10-CM | POA: Diagnosis not present

## 2022-05-22 DIAGNOSIS — Z6832 Body mass index (BMI) 32.0-32.9, adult: Secondary | ICD-10-CM | POA: Diagnosis not present

## 2022-05-22 DIAGNOSIS — M1990 Unspecified osteoarthritis, unspecified site: Secondary | ICD-10-CM | POA: Diagnosis not present

## 2022-06-03 NOTE — Progress Notes (Signed)
Reviewed.  Leah Melendez L. Samiya Mervin, MD, MPH  

## 2022-06-18 ENCOUNTER — Telehealth: Payer: Self-pay | Admitting: Internal Medicine

## 2022-06-18 ENCOUNTER — Telehealth: Payer: Self-pay

## 2022-06-18 NOTE — Telephone Encounter (Signed)
Called and spoke with pt in regards to her first call pt would like a referral for her hip pain. I told her she would have to be seen so that could be discussed I got her scheduled 8/30 @ 11 am

## 2022-06-18 NOTE — Telephone Encounter (Signed)
Pt called stating that she is looking for a referral for her left hip and now pt also states her right hip is hurting her also

## 2022-06-26 ENCOUNTER — Ambulatory Visit: Payer: Medicare PPO | Admitting: Internal Medicine

## 2022-06-26 ENCOUNTER — Ambulatory Visit (INDEPENDENT_AMBULATORY_CARE_PROVIDER_SITE_OTHER): Payer: Medicare PPO

## 2022-06-26 ENCOUNTER — Encounter: Payer: Self-pay | Admitting: Internal Medicine

## 2022-06-26 VITALS — BP 118/80 | HR 70 | Temp 97.4°F | Ht 61.0 in | Wt 176.0 lb

## 2022-06-26 DIAGNOSIS — E785 Hyperlipidemia, unspecified: Secondary | ICD-10-CM

## 2022-06-26 DIAGNOSIS — Z23 Encounter for immunization: Secondary | ICD-10-CM

## 2022-06-26 DIAGNOSIS — J0110 Acute frontal sinusitis, unspecified: Secondary | ICD-10-CM

## 2022-06-26 DIAGNOSIS — M25552 Pain in left hip: Secondary | ICD-10-CM

## 2022-06-26 DIAGNOSIS — I10 Essential (primary) hypertension: Secondary | ICD-10-CM | POA: Diagnosis not present

## 2022-06-26 DIAGNOSIS — N3000 Acute cystitis without hematuria: Secondary | ICD-10-CM

## 2022-06-26 DIAGNOSIS — R1031 Right lower quadrant pain: Secondary | ICD-10-CM

## 2022-06-26 DIAGNOSIS — M25551 Pain in right hip: Secondary | ICD-10-CM

## 2022-06-26 DIAGNOSIS — R110 Nausea: Secondary | ICD-10-CM | POA: Diagnosis not present

## 2022-06-26 DIAGNOSIS — I728 Aneurysm of other specified arteries: Secondary | ICD-10-CM

## 2022-06-26 DIAGNOSIS — R319 Hematuria, unspecified: Secondary | ICD-10-CM

## 2022-06-26 DIAGNOSIS — M47816 Spondylosis without myelopathy or radiculopathy, lumbar region: Secondary | ICD-10-CM

## 2022-06-26 LAB — CBC WITH DIFFERENTIAL/PLATELET
Basophils Absolute: 0 10*3/uL (ref 0.0–0.1)
Basophils Relative: 0.7 % (ref 0.0–3.0)
Eosinophils Absolute: 0.1 10*3/uL (ref 0.0–0.7)
Eosinophils Relative: 1.8 % (ref 0.0–5.0)
HCT: 38.7 % (ref 36.0–46.0)
Hemoglobin: 13.2 g/dL (ref 12.0–15.0)
Lymphocytes Relative: 31.4 % (ref 12.0–46.0)
Lymphs Abs: 1.5 10*3/uL (ref 0.7–4.0)
MCHC: 34.2 g/dL (ref 30.0–36.0)
MCV: 93.6 fl (ref 78.0–100.0)
Monocytes Absolute: 0.4 10*3/uL (ref 0.1–1.0)
Monocytes Relative: 8.4 % (ref 3.0–12.0)
Neutro Abs: 2.8 10*3/uL (ref 1.4–7.7)
Neutrophils Relative %: 57.7 % (ref 43.0–77.0)
Platelets: 161 10*3/uL (ref 150.0–400.0)
RBC: 4.13 Mil/uL (ref 3.87–5.11)
RDW: 12.5 % (ref 11.5–15.5)
WBC: 4.8 10*3/uL (ref 4.0–10.5)

## 2022-06-26 LAB — COMPREHENSIVE METABOLIC PANEL
ALT: 11 U/L (ref 0–35)
AST: 13 U/L (ref 0–37)
Albumin: 3.9 g/dL (ref 3.5–5.2)
Alkaline Phosphatase: 64 U/L (ref 39–117)
BUN: 11 mg/dL (ref 6–23)
CO2: 27 mEq/L (ref 19–32)
Calcium: 9 mg/dL (ref 8.4–10.5)
Chloride: 104 mEq/L (ref 96–112)
Creatinine, Ser: 0.69 mg/dL (ref 0.40–1.20)
GFR: 83.5 mL/min (ref >60.00)
Glucose, Bld: 85 mg/dL (ref 70–99)
Potassium: 4 mEq/L (ref 3.5–5.1)
Sodium: 139 mEq/L (ref 135–145)
Total Bilirubin: 1.6 mg/dL — ABNORMAL HIGH (ref 0.2–1.2)
Total Protein: 6.7 g/dL (ref 6.0–8.3)

## 2022-06-26 LAB — LIPID PANEL
Cholesterol: 139 mg/dL (ref 0–200)
HDL: 49.4 mg/dL (ref >39.00)
LDL Cholesterol: 63 mg/dL (ref 0–99)
NonHDL: 90.09
Total CHOL/HDL Ratio: 3
Triglycerides: 137 mg/dL (ref 0.0–149.0)
VLDL: 27.4 mg/dL (ref 0.0–40.0)

## 2022-06-26 MED ORDER — CIPROFLOXACIN HCL 500 MG PO TABS: 500.0000 mg | ORAL_TABLET | Freq: Two times a day (BID) | ORAL | 0 refills | Status: AC

## 2022-06-26 MED ORDER — ONDANSETRON HCL 4 MG PO TABS: 4.0000 mg | ORAL_TABLET | Freq: Three times a day (TID) | ORAL | 0 refills | Status: DC | PRN

## 2022-06-26 MED ORDER — PREVNAR 20 0.5 ML IM SUSY: 0.5000 mL | PREFILLED_SYRINGE | INTRAMUSCULAR | 0 refills | Status: AC

## 2022-06-26 NOTE — Patient Instructions (Addendum)
Consider flu shot and prevnar 20 in the future   Pneumococcal Conjugate Vaccine (Prevnar 20) Suspension for Injection What is this medication? PNEUMOCOCCAL VACCINE (NEU mo KOK al vak SEEN) is a vaccine. It prevents pneumococcus bacterial infections. These bacteria can cause serious infections like pneumonia, meningitis, and blood infections. This vaccine will not treat an infection and will not cause infection. This vaccine is recommended for adults 18 years and older. This medicine may be used for other purposes; ask your health care provider or pharmacist if you have questions. COMMON BRAND NAME(S): Prevnar 20 What should I tell my care team before I take this medication? They need to know if you have any of these conditions: bleeding disorder fever immune system problems an unusual or allergic reaction to pneumococcal vaccine, diphtheria toxoid, other vaccines, other medicines, foods, dyes, or preservatives pregnant or trying to get pregnant breast-feeding How should I use this medication? This vaccine is injected into a muscle. It is given by a health care provider. A copy of Vaccine Information Statements will be given before each vaccination. Be sure to read this information carefully each time. This sheet may change often. Talk to your health care provider about the use of this medicine in children. Special care may be needed. Overdosage: If you think you have taken too much of this medicine contact a poison control center or emergency room at once. NOTE: This medicine is only for you. Do not share this medicine with others. What if I miss a dose? This does not apply. This medicine is not for regular use. What may interact with this medication? medicines for cancer chemotherapy medicines that suppress your immune function steroid medicines like prednisone or cortisone This list may not describe all possible interactions. Give your health care provider a list of all the medicines,  herbs, non-prescription drugs, or dietary supplements you use. Also tell them if you smoke, drink alcohol, or use illegal drugs. Some items may interact with your medicine. What should I watch for while using this medication? Mild fever and pain should go away in 3 days or less. Report any unusual symptoms to your health care provider. What side effects may I notice from receiving this medication? Side effects that you should report to your doctor or health care professional as soon as possible: allergic reactions (skin rash, itching or hives; swelling of the face, lips, or tongue) confusion fast, irregular heartbeat fever over 102 degrees F muscle weakness seizures trouble breathing unusual bruising or bleeding Side effects that usually do not require medical attention (report to your doctor or health care professional if they continue or are bothersome): fever of 102 degrees F or less headache joint pain muscle cramps, pain pain, tender at site where injected This list may not describe all possible side effects. Call your doctor for medical advice about side effects. You may report side effects to FDA at 1-800-FDA-1088. Where should I keep my medication? This vaccine is only given by a health care provider. It will not be stored at home. NOTE: This sheet is a summary. It may not cover all possible information. If you have questions about this medicine, talk to your doctor, pharmacist, or health care provider.  2023 Elsevier/[employer] Standard (2020-06-16 00:00:00)  Sinus Infection, Adult A sinus infection, also called sinusitis, is inflammation of your sinuses. Sinuses are hollow spaces in the bones around your face. Your sinuses are located: Around your eyes. In the middle of your forehead. Behind your nose. In your cheekbones. Mucus  normally drains out of your sinuses. When your nasal tissues become inflamed or swollen, mucus can become trapped or blocked. This allows bacteria,  viruses, and fungi to grow, which leads to infection. Most infections of the sinuses are caused by a virus. A sinus infection can develop quickly. It can last for up to 4 weeks (acute) or for more than 12 weeks (chronic). A sinus infection often develops after a cold. What are the causes? This condition is caused by anything that creates swelling in the sinuses or stops mucus from draining. This includes: Allergies. Asthma. Infection from bacteria or viruses. Deformities or blockages in your nose or sinuses. Abnormal growths in the nose (nasal polyps). Pollutants, such as chemicals or irritants in the air. Infection from fungi. This is rare. What increases the risk? You are more likely to develop this condition if you: Have a weak body defense system (immune system). Do a lot of swimming or diving. Overuse nasal sprays. Smoke. What are the signs or symptoms? The main symptoms of this condition are pain and a feeling of pressure around the affected sinuses. Other symptoms include: Stuffy nose or congestion that makes it difficult to breathe through your nose. Thick yellow or greenish drainage from your nose. Tenderness, swelling, and warmth over the affected sinuses. A cough that may get worse at night. Decreased sense of smell and taste. Extra mucus that collects in the throat or the back of the nose (postnasal drip) causing a sore throat or bad breath. Tiredness (fatigue). Fever. How is this diagnosed? This condition is diagnosed based on: Your symptoms. Your medical history. A physical exam. Tests to find out if your condition is acute or chronic. This may include: Checking your nose for nasal polyps. Viewing your sinuses using a device that has a light (endoscope). Testing for allergies or bacteria. Imaging tests, such as an MRI or CT scan. In rare cases, a bone biopsy may be done to rule out more serious types of fungal sinus disease. How is this treated? Treatment for a  sinus infection depends on the cause and whether your condition is chronic or acute. If caused by a virus, your symptoms should go away on their own within 10 days. You may be given medicines to relieve symptoms. They include: Medicines that shrink swollen nasal passages (decongestants). A spray that eases inflammation of the nostrils (topical intranasal corticosteroids). Rinses that help get rid of thick mucus in your nose (nasal saline washes). Medicines that treat allergies (antihistamines). Over-the-counter pain relievers. If caused by bacteria, your health care provider may recommend waiting to see if your symptoms improve. Most bacterial infections will get better without antibiotic medicine. You may be given antibiotics if you have: A severe infection. A weak immune system. If caused by narrow nasal passages or nasal polyps, surgery may be needed. Follow these instructions at home: Medicines Take, use, or apply over-the-counter and prescription medicines only as told by your health care provider. These may include nasal sprays. If you were prescribed an antibiotic medicine, take it as told by your health care provider. Do not stop taking the antibiotic even if you start to feel better. Hydrate and humidify  Drink enough fluid to keep your urine pale yellow. Staying hydrated will help to thin your mucus. Use a cool mist humidifier to keep the humidity level in your home above 50%. Inhale steam for 10-15 minutes, 3-4 times a day, or as told by your health care provider. You can do this in the bathroom  while a hot shower is running. Limit your exposure to cool or dry air. Rest Rest as much as possible. Sleep with your head raised (elevated). Make sure you get enough sleep each night. General instructions  Apply a warm, moist washcloth to your face 3-4 times a day or as told by your health care provider. This will help with discomfort. Use nasal saline washes as often as told by your  health care provider. Wash your hands often with soap and water to reduce your exposure to germs. If soap and water are not available, use hand sanitizer. Do not smoke. Avoid being around people who are smoking (secondhand smoke). Keep all follow-up visits. This is important. Contact a health care provider if: You have a fever. Your symptoms get worse. Your symptoms do not improve within 10 days. Get help right away if: You have a severe headache. You have persistent vomiting. You have severe pain or swelling around your face or eyes. You have vision problems. You develop confusion. Your neck is stiff. You have trouble breathing. These symptoms may be an emergency. Get help right away. Call 911. Do not wait to see if the symptoms will go away. Do not drive yourself to the hospital. Summary A sinus infection is soreness and inflammation of your sinuses. Sinuses are hollow spaces in the bones around your face. This condition is caused by nasal tissues that become inflamed or swollen. The swelling traps or blocks the flow of mucus. This allows bacteria, viruses, and fungi to grow, which leads to infection. If you were prescribed an antibiotic medicine, take it as told by your health care provider. Do not stop taking the antibiotic even if you start to feel better. Keep all follow-up visits. This is important. This information is not intended to replace advice given to you by your health care provider. Make sure you discuss any questions you have with your health care provider. Document Revised: 09/18/2021 Document Reviewed: 09/18/2021 Elsevier Patient Education  [place].  Urinary Tract Infection, Adult  A urinary tract infection (UTI) is an infection of any part of the urinary tract. The urinary tract includes the kidneys, ureters, bladder, and urethra. These organs make, store, and get rid of urine in the body. An upper UTI affects the ureters and kidneys. A lower UTI affects  the bladder and urethra. What are the causes? Most urinary tract infections are caused by bacteria in your genital area around your urethra, where urine leaves your body. These bacteria grow and cause inflammation of your urinary tract. What increases the risk? You are more likely to develop this condition if: You have a urinary catheter that stays in place. You are not able to control when you urinate or have a bowel movement (incontinence). You are female and you: Use a spermicide or diaphragm for birth control. Have low estrogen levels. Are pregnant. You have certain genes that increase your risk. You are sexually active. You take antibiotic medicines. You have a condition that causes your flow of urine to slow down, such as: An enlarged prostate, if you are female. Blockage in your urethra. A kidney stone. A nerve condition that affects your bladder control (neurogenic bladder). Not getting enough to drink, or not urinating often. You have certain medical conditions, such as: Diabetes. A weak disease-fighting system (immunesystem). Sickle cell disease. Gout. Spinal cord injury. What are the signs or symptoms? Symptoms of this condition include: Needing to urinate right away (urgency). Frequent urination. This may include small  amounts of urine each time you urinate. Pain or burning with urination. Blood in the urine. Urine that smells bad or unusual. Trouble urinating. Cloudy urine. Vaginal discharge, if you are female. Pain in the abdomen or the lower back. You may also have: Vomiting or a decreased appetite. Confusion. Irritability or tiredness. A fever or chills. Diarrhea. The first symptom in older adults may be confusion. In some cases, they may not have any symptoms until the infection has worsened. How is this diagnosed? This condition is diagnosed based on your medical history and a physical exam. You may also have other tests, including: Urine tests. Blood  tests. Tests for STIs (sexually transmitted infections). If you have had more than one UTI, a cystoscopy or imaging studies may be done to determine the cause of the infections. How is this treated? Treatment for this condition includes: Antibiotic medicine. Over-the-counter medicines to treat discomfort. Drinking enough water to stay hydrated. If you have frequent infections or have other conditions such as a kidney stone, you may need to see a health care provider who specializes in the urinary tract (urologist). In rare cases, urinary tract infections can cause sepsis. Sepsis is a life-threatening condition that occurs when the body responds to an infection. Sepsis is treated in the hospital with IV antibiotics, fluids, and other medicines. Follow these instructions at home:  Medicines Take over-the-counter and prescription medicines only as told by your health care provider. If you were prescribed an antibiotic medicine, take it as told by your health care provider. Do not stop using the antibiotic even if you start to feel better. General instructions Make sure you: Empty your bladder often and completely. Do not hold urine for long periods of time. Empty your bladder after sex. Wipe from front to back after urinating or having a bowel movement if you are female. Use each tissue only one time when you wipe. Drink enough fluid to keep your urine pale yellow. Keep all follow-up visits. This is important. Contact a health care provider if: Your symptoms do not get better after 1-2 days. Your symptoms go away and then return. Get help right away if: You have severe pain in your back or your lower abdomen. You have a fever or chills. You have nausea or vomiting. Summary A urinary tract infection (UTI) is an infection of any part of the urinary tract, which includes the kidneys, ureters, bladder, and urethra. Most urinary tract infections are caused by bacteria in your genital  area. Treatment for this condition often includes antibiotic medicines. If you were prescribed an antibiotic medicine, take it as told by your health care provider. Do not stop using the antibiotic even if you start to feel better. Keep all follow-up visits. This is important. This information is not intended to replace advice given to you by your health care provider. Make sure you discuss any questions you have with your health care provider. Document Revised: 05/26/2020 Document Reviewed: 05/26/2020 Elsevier Patient Education  [place].

## 2022-06-26 NOTE — Progress Notes (Addendum)
Chief Complaint  Patient presents with   Groin Pain    Pt has groin pain and lower abdominal pain pt has experienced bloating as well pt hasnt had an appetite but she is staying hydrated. Pt thinks she has kidney and bladder issues because she also having back pain along with the groin pain.   F/u  1. C/o rlq ab pain and suprapubic ab pain and urine freq and urgency some nausea no h/o UTI since she was pregnant and she has seen blood spotting in her underwear sine 02/2022  She has been feeling tired and not well x few days still drinking water and trying to eat and stay active  2. C/o right hip pain intermittent and h/o left hip pain mild to moderate she takes Tylenol prn  3. C/o left frontal sinus pain x days nothing tried other than Tylenol   Review of Systems  Constitutional:  Negative for weight loss.  HENT:  Negative for hearing loss.   Eyes:  Negative for blurred vision.  Respiratory:  Negative for shortness of breath.   Cardiovascular:  Negative for chest pain.  Gastrointestinal:  Positive for abdominal pain. Negative for blood in stool.  Genitourinary:  Positive for dysuria, frequency and urgency.  Musculoskeletal:  Negative for falls and joint pain.  Skin:  Negative for rash.  Neurological:  Negative for headaches.  Psychiatric/Behavioral:  Negative for depression.    Past Medical History:  Diagnosis Date   Anxiety    Aortic atherosclerosis ([place])    Basal cell carcinoma 03/2019   right upper forehead, MOHS   Basal cell carcinoma 10/2012   Left upper back   COVID-19    04/11/21 tx'ed paxlovid on call Dr. Scarlette Calico   Diverticulosis    Fatty liver 2010   GERD (gastroesophageal reflux disease)    H. pylori infection    Hemangioma 2010   Hiatal hernia    Hyperlipidemia    Hypertension    Liver hemangioma    Splenic artery aneurysm (HCC)    Tubular adenoma of colon    Past Surgical History:  Procedure Laterality Date   bilateral lens replacement      CHOLECYSTECTOMY     COLONOSCOPY  2012   KNEE SURGERY  06/29/2007   Meniscus tear, left knee surgery   UPPER GASTROINTESTINAL ENDOSCOPY     Family History  Problem Relation Age of Onset   Hypertension Sister    Skin cancer Sister    Hypertension Sister    Heart attack Sister        67 y.o as of 09/10/19    Anxiety disorder Sister    Hypertension Brother    Colon cancer Neg Hx    Colon polyps Neg Hx    Esophageal cancer Neg Hx    Rectal cancer Neg Hx    Stomach cancer Neg Hx    Social History   Socioeconomic History   Marital status: Widowed    Spouse name: Not on file   Number of children: 2   Years of education: Not on file   Highest education level: Not on file  Occupational History   Occupation: Retired Garment/textile technologist  Tobacco Use   Smoking status: Never   Smokeless tobacco: Never  Vaping Use   Vaping Use: Never used  Substance and Sexual Activity   Alcohol use: No   Drug use: No   Sexual activity: Not Currently    Birth control/protection: None  Other Topics Concern   Not  on file  Social History Narrative   Has living will.  Widow as of 04/08/18.  Daughters have a copy of living will and [name] would be HPOA if husband was not able.   1 daughter lives in [place] has 4 kids and 1 daughter in [place] with 2 kids      Would desire CPR.  She would not want heroic measures.            Social Determinants of Health   Financial Resource Strain: Low Risk  (12/03/2021)   Overall Financial Resource Strain (CARDIA)    Difficulty of Paying Living Expenses: Not hard at all  Food Insecurity: No Food Insecurity (12/03/2021)   Hunger Vital Sign    Worried About Running Out of Food in the Last Year: Never true    Ran Out of Food in the Last Year: Never true  Transportation Needs: No Transportation Needs (12/03/2021)   PRAPARE - Hydrologist (Medical): No    Lack of Transportation (Non-Medical): No  Physical Activity:  Sufficiently Active (12/03/2021)   Exercise Vital Sign    Days of Exercise per Week: 4 days    Minutes of Exercise per Session: 40 min  Stress: No Stress Concern Present (12/03/2021)   [place]    Feeling of Stress : Not at all  Social Connections: Unknown (12/03/2021)   Social Connection and Isolation Panel [NHANES]    Frequency of Communication with Friends and Family: More than three times a week    Frequency of Social Gatherings with Friends and Family: More than three times a week    Attends Religious Services: More than 4 times per year    Active Member of [employer] or Organizations: Not on file    Attends Archivist Meetings: Not on file    Marital Status: Widowed  Intimate Partner Violence: Not At Risk (12/03/2021)   Humiliation, Afraid, Rape, and Kick questionnaire    Fear of Current or Ex-Partner: No    Emotionally Abused: No    Physically Abused: No    Sexually Abused: No   Current Meds  Medication Sig   albuterol (VENTOLIN HFA) 108 (90 Base) MCG/ACT inhaler INHALE 1 TO 2 PUFFS INTO THE LUNGS EVERY 6 HOURS AS NEEDED FOR WHEEZING OR SHORTNESS OF BREATH OR COUGH   Ascorbic Acid (VITAMIN C) 500 MG tablet Take 500 mg by mouth daily.   aspirin EC 81 MG tablet Take 81 mg by mouth daily.   azelastine (ASTELIN) 0.1 % nasal spray Place 2 sprays into both nostrils as needed. Use in each nostril as directed   Calcium Carbonate-Vitamin D (CALCIUM 600/VITAMIN D PO) Take 1 tablet by mouth 2 (two) times daily.   ciprofloxacin (CIPRO) 500 MG tablet Take 1 tablet (500 mg total) by mouth 2 (two) times daily for 5 days. With food   Diclofenac Sodium (VOLTAREN EX) Apply topically as needed.   ezetimibe (ZETIA) 10 MG tablet TAKE 1 TABLET(10 MG) BY MOUTH DAILY   fish oil-omega-3 fatty acids 1000 MG capsule Take 2 g by mouth daily.   fluticasone (FLONASE) 50 MCG/ACT nasal spray Place into both nostrils daily.   melatonin 5 MG  TABS Take 5 mg by mouth at bedtime as needed.   metoprolol tartrate (LOPRESSOR) 25 MG tablet Take 0.5 tablets (12.5 mg total) by mouth 2 (two) times daily.   ondansetron (ZOFRAN) 4 MG tablet Take 1 tablet (4 mg  total) by mouth every 8 (eight) hours as needed.   pantoprazole (PROTONIX) 40 MG tablet Take 1 tablet (40 mg total) by mouth 2 (two) times daily before a meal. 30 min before food   pneumococcal 20-valent conjugate vaccine (PREVNAR 20) 0.5 ML injection Inject 0.5 mLs into the muscle tomorrow at 10 am for 1 dose.   simvastatin (ZOCOR) 40 MG tablet TAKE 1 TABLET(40 MG) BY MOUTH DAILY   vitamin E 400 UNIT capsule Take 400 Units by mouth daily.   Allergies  Allergen Reactions   Dexilant [Dexlansoprazole] Diarrhea   Sulfonamide Derivatives     REACTION: as child   No results found for this or any previous visit (from the past 2160 hour(s)). Objective  Body mass index is 33.25 kg/m. Wt Readings from Last 3 Encounters:  06/26/22 176 lb (79.8 kg)  05/20/22 179 lb 6.4 oz (81.4 kg)  04/22/22 179 lb (81.2 kg)   Temp Readings from Last 3 Encounters:  06/26/22 (!) 97.4 F (36.3 C) (Oral)  04/22/22 97.7 F (36.5 C) (Temporal)  02/26/22 97.6 F (36.4 C) (Oral)   BP Readings from Last 3 Encounters:  06/26/22 118/80  05/20/22 122/68  04/22/22 (!) 153/73   Pulse Readings from Last 3 Encounters:  06/26/22 70  05/20/22 71  04/22/22 62    Physical Exam Vitals and nursing note reviewed.  Constitutional:      Appearance: Normal appearance. She is well-developed and well-groomed.  HENT:     Head: Normocephalic and atraumatic.  Eyes:     Conjunctiva/sclera: Conjunctivae normal.     Pupils: Pupils are equal, round, and reactive to light.  Cardiovascular:     Rate and Rhythm: Normal rate and regular rhythm.     Heart sounds: Normal heart sounds. No murmur heard. Pulmonary:     Effort: Pulmonary effort is normal.     Breath sounds: Normal breath sounds.  Abdominal:     General:  Abdomen is flat. Bowel sounds are normal.     Tenderness: There is no abdominal tenderness.  Musculoskeletal:     Right hip: Tenderness present.     Left hip: Tenderness present.  Skin:    General: Skin is warm and dry.  Neurological:     General: No focal deficit present.     Mental Status: She is alert and oriented to person, place, and time. Mental status is at baseline.     Cranial Nerves: Cranial nerves 2-12 are intact.     Motor: Motor function is intact.     Coordination: Coordination is intact.     Gait: Gait is intact.  Psychiatric:        Attention and Perception: Attention and perception normal.        Mood and Affect: Mood and affect normal.        Speech: Speech normal.        Behavior: Behavior normal. Behavior is cooperative.        Thought Content: Thought content normal.        Cognition and Memory: Cognition and memory normal.        Judgment: Judgment normal.     Assessment  Plan  Right lower quadrant abdominal pain ct 02/2022 +constipation pt denies currently and c/w UTI sx's- Plan: Urinalysis, Routine w reflex microscopic, Urine Culture Cmet, cbc Ct ab pelvis reviewed 03/19/22  Musculoskeletal: Lumbar degenerative change with diffuse facet hypertrophy and partial facet ankylosis on the left at multiple levels. There are no acute or suspicious osseous  abnormalities.   IMPRESSION: 1. No renal stones or obstructive uropathy. Mild bilateral renal parenchymal thinning. No explanation for hematuria on this unenhanced exam. As clinically indicated if renal function persists, hematuria protocol CT without and with IV contrast could be considered. 2. Mild circumferential bladder wall thickening, can be seen with cystitis. 3. Moderate-sized hiatal hernia with asymmetric wall thickening of the herniated portion of the stomach, which may be due to reflux or esophagitis. Recommend correlation with endoscopy. 4. Sigmoid colonic diverticulosis without  diverticulitis. 5. Aortic atherosclerosis. 6. Stable 17 mm splenic artery aneurysm. This has been stable for more than 10 years, and no specific imaging follow-up is recommended.   Aortic Atherosclerosis (ICD10-I70.0).    Acute cystitis without hematuria - Plan: ciprofloxacin (CIPRO) 500 MG tablet bid x 3-5 days  Essential hypertension controlled on BB- Plan: Comprehensive metabolic panel, CBC with Differential/Platelet, Lipid panel  Hyperlipidemia, unspecified hyperlipidemia type - Plan: Lipid panel Lifestyle changes   Bilateral hip pain  could be referred from back Ct changes low back 02/2022 Musculoskeletal: Lumbar degenerative change with diffuse facet hypertrophy and partial facet ankylosis on the left at multiple levels. There are no acute or suspicious osseous abnormalities.    - Plan: DG HIPS BILAT WITH PELVIS 3-4 VIEWS FINDINGS: There is no evidence of hip fracture or dislocation. There is no evidence of arthropathy or other focal bone abnormality.   IMPRESSION: Negative.  Electronically Signed   By: Ronney Asters M.D.   On: 06/26/2022 22:03 -will refer to ortho    Nausea - Plan: ondansetron (ZOFRAN) 4 MG tablet  Acute non-recurrent frontal sinusitis - Plan: ciprofloxacin (CIPRO) 500 MG tablet bid x 3-5 days  HM Flu shot rec in future tdap 03/22/20 shingrix utd prevnar utd pna 23 utd Consider prevnar 20  utd covid 3/3 pfizer consider booster declines further   mammo normal 08/24/21 pfw will f/u Dr. Matthew Saras 08/24/22   dexa 07/01/2019 osteopenia, 08/15/21 abnormal [facility] spine nl, -1.8 left neck, 01.9 right neck osteopenia [facility]   Colonoscopy normal 07/25/11 Dr. Deatra Ina f/u in 10 years  Referred [facility] 11/16/21 precancerous polyps ? Egd 04/22/22 and colonoscopy 11/16/21   Dr. Tarri Glenn   Out of age window pap   Of note sister patsy lough also a pt    Dr. Pryor Ochoa ENT  [facility] 07/01/2019 My eye doctor     Provider: Dr. [name] McLean-Scocuzza-Internal Medicine

## 2022-06-27 LAB — URINALYSIS, ROUTINE W REFLEX MICROSCOPIC
Bacteria, UA: NONE SEEN /HPF
Bilirubin Urine: NEGATIVE
Glucose, UA: NEGATIVE
Hyaline Cast: NONE SEEN /LPF
Ketones, ur: NEGATIVE
Nitrite: NEGATIVE
Protein, ur: NEGATIVE
Specific Gravity, Urine: 1.014 (ref 1.001–1.035)
WBC, UA: NONE SEEN /HPF (ref 0–5)
pH: 6 (ref 5.0–8.0)

## 2022-06-27 LAB — URINE CULTURE
MICRO NUMBER:: 13853158
SPECIMEN QUALITY:: ADEQUATE

## 2022-06-27 LAB — MICROSCOPIC MESSAGE

## 2022-07-01 DIAGNOSIS — M47816 Spondylosis without myelopathy or radiculopathy, lumbar region: Secondary | ICD-10-CM | POA: Insufficient documentation

## 2022-07-01 NOTE — Addendum Note (Signed)
Addended by: Orland Mustard on: 07/01/2022 07:14 PM   Modules accepted: Orders

## 2022-07-05 ENCOUNTER — Other Ambulatory Visit: Payer: Self-pay

## 2022-07-05 DIAGNOSIS — K219 Gastro-esophageal reflux disease without esophagitis: Secondary | ICD-10-CM

## 2022-07-05 MED ORDER — PANTOPRAZOLE SODIUM 40 MG PO TBEC: 40.0000 mg | DELAYED_RELEASE_TABLET | Freq: Two times a day (BID) | ORAL | 6 refills | Status: DC

## 2022-07-08 DIAGNOSIS — R319 Hematuria, unspecified: Secondary | ICD-10-CM | POA: Diagnosis not present

## 2022-07-08 DIAGNOSIS — R102 Pelvic and perineal pain: Secondary | ICD-10-CM | POA: Diagnosis not present

## 2022-07-09 ENCOUNTER — Telehealth: Payer: Self-pay | Admitting: Internal Medicine

## 2022-07-09 NOTE — Telephone Encounter (Signed)
Pt called stating she went to her Gynecologist on yesterday and it was not a UTI but pt is still having the problem like before

## 2022-07-10 NOTE — Telephone Encounter (Signed)
Referred her to Dr. Diamantina [facility] urology for her noticing blood with wiping on tissues and some blood in urine  See if pt can come and get low back Xray as back pain can radiate to abdomen, hips, groin, down the legs  -sch xray low back

## 2022-07-10 NOTE — Addendum Note (Signed)
Addended by: Orland Mustard on: 07/10/2022 04:50 PM   Modules accepted: Orders

## 2022-07-11 ENCOUNTER — Ambulatory Visit (INDEPENDENT_AMBULATORY_CARE_PROVIDER_SITE_OTHER): Payer: Medicare PPO

## 2022-07-11 ENCOUNTER — Other Ambulatory Visit: Payer: Medicare PPO

## 2022-07-11 DIAGNOSIS — M545 Low back pain, unspecified: Secondary | ICD-10-CM | POA: Diagnosis not present

## 2022-07-11 DIAGNOSIS — G8929 Other chronic pain: Secondary | ICD-10-CM

## 2022-07-15 ENCOUNTER — Telehealth: Payer: Self-pay

## 2022-07-15 ENCOUNTER — Encounter: Payer: Self-pay | Admitting: Internal Medicine

## 2022-07-15 DIAGNOSIS — M47816 Spondylosis without myelopathy or radiculopathy, lumbar region: Secondary | ICD-10-CM | POA: Insufficient documentation

## 2022-07-15 NOTE — Telephone Encounter (Signed)
Patient returned our call and I read Dr. [name] McLean-Scocuzza's message to her.  Patient states she is agreeable with having a vascular consult.

## 2022-07-15 NOTE — Telephone Encounter (Signed)
Lvm for pt to return call in regards to x-ray results.  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

## 2022-07-15 NOTE — Addendum Note (Signed)
Addended by: Orland Mustard on: 07/15/2022 11:25 AM   Modules accepted: Orders

## 2022-07-17 ENCOUNTER — Other Ambulatory Visit: Payer: Self-pay | Admitting: Internal Medicine

## 2022-07-17 DIAGNOSIS — R000 Tachycardia, unspecified: Secondary | ICD-10-CM

## 2022-07-17 DIAGNOSIS — I10 Essential (primary) hypertension: Secondary | ICD-10-CM

## 2022-07-19 ENCOUNTER — Ambulatory Visit: Payer: Medicare PPO | Admitting: Urology

## 2022-07-19 VITALS — BP 149/84 | HR 68 | Ht 61.0 in | Wt 177.0 lb

## 2022-07-19 DIAGNOSIS — R102 Pelvic and perineal pain: Secondary | ICD-10-CM

## 2022-07-19 DIAGNOSIS — R310 Gross hematuria: Secondary | ICD-10-CM

## 2022-07-19 DIAGNOSIS — R350 Frequency of micturition: Secondary | ICD-10-CM | POA: Diagnosis not present

## 2022-07-19 DIAGNOSIS — N368 Other specified disorders of urethra: Secondary | ICD-10-CM

## 2022-07-19 DIAGNOSIS — R3915 Urgency of urination: Secondary | ICD-10-CM

## 2022-07-19 LAB — URINALYSIS, COMPLETE
Bilirubin, UA: NEGATIVE
Glucose, UA: NEGATIVE
Ketones, UA: NEGATIVE
Nitrite, UA: NEGATIVE
Specific Gravity, UA: 1.02 (ref 1.005–1.030)
Urobilinogen, Ur: 0.2 mg/dL (ref 0.2–1.0)
pH, UA: 5 (ref 5.0–7.5)

## 2022-07-19 LAB — MICROSCOPIC EXAMINATION

## 2022-07-19 MED ORDER — AMITRIPTYLINE HCL 10 MG PO TABS: 10.0000 mg | ORAL_TABLET | Freq: Every day | ORAL | 0 refills | Status: DC

## 2022-07-19 NOTE — Progress Notes (Unsigned)
07/19/2022 8:43 AM   Leah Melendez April 22, 1946 [id number]  Referring provider: McLean-Scocuzza, Nino Glow, MD [place],  [place] [address]  Chief Complaint  Patient presents with   Hematuria    HPI: Leah Melendez is a 76 y.o. female referred for evaluation of urethral spotting.     PMH: Past Medical History:  Diagnosis Date   Anxiety    Aortic atherosclerosis ([place])    Basal cell carcinoma 03/2019   right upper forehead, MOHS   Basal cell carcinoma 10/2012   Left upper back   COVID-19    04/11/21 tx'ed paxlovid on call Dr. Scarlette Calico   Diverticulosis    Fatty liver 2010   GERD (gastroesophageal reflux disease)    H. pylori infection    Hemangioma 2010   Hiatal hernia    Hyperlipidemia    Hypertension    Liver hemangioma    Splenic artery aneurysm (HCC)    Tubular adenoma of colon     Surgical History: Past Surgical History:  Procedure Laterality Date   bilateral lens replacement     CHOLECYSTECTOMY     COLONOSCOPY  2012   KNEE SURGERY  06/29/2007   Meniscus tear, left knee surgery   UPPER GASTROINTESTINAL ENDOSCOPY      Home Medications:  Allergies as of 07/19/2022       Reactions   Dexilant [dexlansoprazole] Diarrhea   Sulfonamide Derivatives    REACTION: as child        Medication List        Accurate as of July 19, 2022  8:43 AM. If you have any questions, ask your nurse or doctor.          albuterol 108 (90 Base) MCG/ACT inhaler Commonly known as: VENTOLIN HFA INHALE 1 TO 2 PUFFS INTO THE LUNGS EVERY 6 HOURS AS NEEDED FOR WHEEZING OR SHORTNESS OF BREATH OR COUGH   ALPRAZolam 0.25 MG tablet Commonly known as: XANAX Take 0.5 mg by mouth 2 (two) times daily as needed.   ascorbic acid 500 MG tablet Commonly known as: VITAMIN C Take 500 mg by mouth daily.   aspirin EC 81 MG tablet Take 81 mg by mouth daily.   azelastine 0.1 % nasal spray Commonly known as: ASTELIN Place 2 sprays into both  nostrils as needed. Use in each nostril as directed   CALCIUM 600/VITAMIN D PO Take 1 tablet by mouth 2 (two) times daily.   ezetimibe 10 MG tablet Commonly known as: ZETIA TAKE 1 TABLET(10 MG) BY MOUTH DAILY   fish oil-omega-3 fatty acids 1000 MG capsule Take 2 g by mouth daily.   fluticasone 50 MCG/ACT nasal spray Commonly known as: FLONASE Place into both nostrils daily.   melatonin 5 MG Tabs Take 5 mg by mouth Melendez bedtime as needed.   metoprolol tartrate 25 MG tablet Commonly known as: LOPRESSOR TAKE 1/2 TABLET BY MOUTH TWICE DAILY   ondansetron 4 MG tablet Commonly known as: Zofran Take 1 tablet (4 mg total) by mouth every 8 (eight) hours as needed.   pantoprazole 40 MG tablet Commonly known as: PROTONIX Take 1 tablet (40 mg total) by mouth 2 (two) times daily before a meal. 30 min before food   simvastatin 40 MG tablet Commonly known as: ZOCOR TAKE 1 TABLET(40 MG) BY MOUTH DAILY   vitamin E 180 MG (400 UNITS) capsule Take 400 Units by mouth daily.   VOLTAREN EX Apply topically as needed.  Allergies:  Allergies  Allergen Reactions   Dexilant [Dexlansoprazole] Diarrhea   Sulfonamide Derivatives     REACTION: as child    Family History: Family History  Problem Relation Age of Onset   Hypertension Sister    Skin cancer Sister    Hypertension Sister    Heart attack Sister        86 y.o as of 09/10/19    Anxiety disorder Sister    Hypertension Brother    Colon cancer Neg Hx    Colon polyps Neg Hx    Esophageal cancer Neg Hx    Rectal cancer Neg Hx    Stomach cancer Neg Hx     Social History:  reports that she has never smoked. She has never used smokeless tobacco. She reports that she does not drink alcohol and does not use drugs.   Physical Exam: There were no vitals taken for this visit.  Constitutional:  Alert and oriented, No acute distress. HEENT: Leah Melendez, moist mucus membranes.  Trachea midline, no masses. Cardiovascular: No  clubbing, cyanosis, or edema. Respiratory: Normal respiratory effort, no increased work of breathing. GI: Abdomen is soft, nontender, nondistended, no abdominal masses GU: No CVA tenderness Skin: No rashes, bruises or suspicious lesions. Neurologic: Grossly intact, no focal deficits, moving all 4 extremities. Psychiatric: Normal mood and affect.  Laboratory Data: Lab Results  Component Value Date   WBC 4.8 06/26/2022   HGB 13.2 06/26/2022   HCT 38.7 06/26/2022   MCV 93.6 06/26/2022   PLT 161.0 06/26/2022    Lab Results  Component Value Date   CREATININE 0.69 06/26/2022    No results found for: "PSA"  No results found for: "TESTOSTERONE"  Lab Results  Component Value Date   HGBA1C 5.4 09/08/2020    Urinalysis    Component Value Date/Time   COLORURINE YELLOW 06/26/2022 1214   [place] 06/26/2022 1214   LABSPEC 1.014 06/26/2022 1214   PHURINE 6.0 06/26/2022 1214   GLUCOSEU NEGATIVE 06/26/2022 1214   GLUCOSEU NEGATIVE 06/06/2011 1103   HGBUR TRACE (A) 06/26/2022 1214   HGBUR trace-intact 06/06/2009 1413   BILIRUBINUR NEGATIVE 07/01/2019 [place] 06/26/2022 1214   PROTEINUR NEGATIVE 06/26/2022 1214   UROBILINOGEN 1.0 06/07/2011 2112   NITRITE NEGATIVE 06/26/2022 1214   LEUKOCYTESUR TRACE (A) 06/26/2022 1214    Lab Results  Component Value Date   BACTERIA NONE SEEN 06/26/2022    Pertinent Imaging: *** No results found for this or any previous visit.  Results for orders placed during the hospital encounter of 09/11/19  US Venous Img Lower Bilateral  Narrative CLINICAL DATA:  Elevated D-dimer. History of varicose veins. Evaluate for DVT.  EXAM: BILATERAL LOWER EXTREMITY VENOUS DOPPLER ULTRASOUND  TECHNIQUE: Gray-scale sonography with graded compression, as well as color Doppler and duplex ultrasound were performed to evaluate the lower extremity deep venous systems from the level of the common femoral vein and including the  common femoral, femoral, profunda femoral, popliteal and calf veins including the posterior tibial, peroneal and gastrocnemius veins when visible. The superficial great saphenous vein was also interrogated. Spectral Doppler was utilized to evaluate flow Melendez rest and with distal augmentation maneuvers in the common femoral, femoral and popliteal veins.  COMPARISON:  None.  FINDINGS: RIGHT LOWER EXTREMITY  Common Femoral Vein: No evidence of thrombus. Normal compressibility, respiratory phasicity and response to augmentation.  Saphenofemoral Junction: No evidence of thrombus. Normal compressibility and flow on color Doppler imaging.  Profunda Femoral Vein: No evidence  of thrombus. Normal compressibility and flow on color Doppler imaging.  Femoral Vein: No evidence of thrombus. Normal compressibility, respiratory phasicity and response to augmentation.  Popliteal Vein: No evidence of thrombus. Normal compressibility, respiratory phasicity and response to augmentation.  Calf Veins: No evidence of thrombus. Normal compressibility and flow on color Doppler imaging.  Superficial Great Saphenous Vein: No evidence of thrombus. Normal compressibility.  Venous Reflux:  None.  Other Findings:  None.  LEFT LOWER EXTREMITY  Common Femoral Vein: No evidence of thrombus. Normal compressibility, respiratory phasicity and response to augmentation.  Saphenofemoral Junction: No evidence of thrombus. Normal compressibility and flow on color Doppler imaging.  Profunda Femoral Vein: No evidence of thrombus. Normal compressibility and flow on color Doppler imaging.  Femoral Vein: No evidence of thrombus. Normal compressibility, respiratory phasicity and response to augmentation.  Popliteal Vein: No evidence of thrombus. Normal compressibility, respiratory phasicity and response to augmentation.  Calf Veins: No evidence of thrombus. Normal compressibility and flow on color Doppler  imaging.  Superficial Great Saphenous Vein: No evidence of thrombus. Normal compressibility.  Venous Reflux:  None.  Other Findings:  None.  IMPRESSION: No evidence of DVT within either lower extremity.   Electronically Signed By: Sandi Mariscal M.D. On: 09/11/2019 07:57  No results found for this or any previous visit.  No results found for this or any previous visit.  No results found for this or any previous visit.  No valid procedures specified. No results found for this or any previous visit.  No results found for this or any previous visit.   Assessment & Plan:    1. Gross hematuria *** - Urinalysis, Complete   No follow-ups on file.  Abbie Sons, [place] [address], [place], [place] [address] [phone]

## 2022-07-20 ENCOUNTER — Encounter: Payer: Self-pay | Admitting: Urology

## 2022-07-22 LAB — CULTURE, URINE COMPREHENSIVE

## 2022-07-31 ENCOUNTER — Encounter: Payer: Self-pay | Admitting: Urology

## 2022-07-31 ENCOUNTER — Ambulatory Visit: Payer: Medicare PPO | Admitting: Urology

## 2022-07-31 VITALS — BP 128/74 | HR 80 | Ht 61.0 in | Wt 177.0 lb

## 2022-07-31 DIAGNOSIS — N368 Other specified disorders of urethra: Secondary | ICD-10-CM

## 2022-07-31 DIAGNOSIS — R350 Frequency of micturition: Secondary | ICD-10-CM

## 2022-07-31 DIAGNOSIS — R102 Pelvic and perineal pain: Secondary | ICD-10-CM | POA: Diagnosis not present

## 2022-07-31 NOTE — Progress Notes (Signed)
   07/31/22  CC:  Chief Complaint  Patient presents with   Cysto    HPI: Refer to my previous note 07/19/2021 for details.  She does feel her groin/pelvic pain have improved on amitriptyline.  Still present to a much lesser degree  Blood pressure 128/74, pulse 80, height '5\' 1"'$  (1.549 m), weight 177 lb (80.3 kg). NED. A&Ox3.   No respiratory distress   Abd soft, NT, ND Atrophic external genitalia with patent urethral meatus.  No external urethral lesions noted  Cystoscopy Procedure Note  Patient identification was confirmed, informed consent was obtained, and patient was prepped using Betadine solution.  Lidocaine jelly was administered per urethral meatus.    Procedure: - Flexible cystoscope introduced, without any difficulty.   - Thorough search of the bladder revealed:    normal urethral     normal urothelium    no stones    no ulcers     no tumors    no urethral polyps    no trabeculation  - Ureteral orifices were normal in position and appearance.  Post-Procedure: - Patient tolerated the procedure well  Assessment/ Plan: No significant abnormalities on cystoscopy Pelvic pain improved on low-dose amitriptyline For continued oral worsening pelvic pain consider referral to [facility] for further evaluation of her pelvic pain/trigger point injections.  She states she has an appointment with her gynecologist next month and we will discuss further at that appointment   Abbie Sons, MD

## 2022-08-01 LAB — URINALYSIS, COMPLETE
Bilirubin, UA: NEGATIVE
Glucose, UA: NEGATIVE
Ketones, UA: NEGATIVE
Leukocytes,UA: NEGATIVE
Nitrite, UA: NEGATIVE
Protein,UA: NEGATIVE
Specific Gravity, UA: 1.01 (ref 1.005–1.030)
Urobilinogen, Ur: 0.2 mg/dL (ref 0.2–1.0)
pH, UA: 5 (ref 5.0–7.5)

## 2022-08-01 LAB — MICROSCOPIC EXAMINATION

## 2022-08-02 ENCOUNTER — Other Ambulatory Visit: Payer: Self-pay | Admitting: Urology

## 2022-08-05 ENCOUNTER — Ambulatory Visit (INDEPENDENT_AMBULATORY_CARE_PROVIDER_SITE_OTHER): Payer: Medicare PPO | Admitting: Vascular Surgery

## 2022-08-05 ENCOUNTER — Encounter (INDEPENDENT_AMBULATORY_CARE_PROVIDER_SITE_OTHER): Payer: Self-pay | Admitting: Vascular Surgery

## 2022-08-05 VITALS — BP 131/77 | HR 75 | Resp 17 | Ht 61.0 in | Wt 177.6 lb

## 2022-08-05 DIAGNOSIS — I872 Venous insufficiency (chronic) (peripheral): Secondary | ICD-10-CM

## 2022-08-05 DIAGNOSIS — K219 Gastro-esophageal reflux disease without esophagitis: Secondary | ICD-10-CM | POA: Diagnosis not present

## 2022-08-05 DIAGNOSIS — E782 Mixed hyperlipidemia: Secondary | ICD-10-CM | POA: Diagnosis not present

## 2022-08-05 DIAGNOSIS — I2510 Atherosclerotic heart disease of native coronary artery without angina pectoris: Secondary | ICD-10-CM

## 2022-08-05 DIAGNOSIS — I10 Essential (primary) hypertension: Secondary | ICD-10-CM

## 2022-08-05 DIAGNOSIS — I728 Aneurysm of other specified arteries: Secondary | ICD-10-CM

## 2022-08-05 NOTE — Progress Notes (Signed)
MRN : [id number]  Leah Melendez is a 76 y.o. (03/28/1946) female who presents with chief complaint of check circulation.  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 The aneurysm was found incidentally by CT scan. Patient denies abdominal pain or unusual back or flank pain, no other abdominal complaints.    No family history of aneurysmal disease.   Patient denies amaurosis fugax or TIA symptoms. There is no history of claudication or rest pain symptoms of the lower extremities.  The patient denies angina or shortness of breath.  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 This has been stable for more than 10 years.  Current Meds  Medication Sig   albuterol (VENTOLIN HFA) 108 (90 Base) MCG/ACT inhaler INHALE 1 TO 2 PUFFS INTO THE LUNGS EVERY 6 HOURS AS NEEDED FOR WHEEZING OR SHORTNESS OF BREATH OR COUGH   amitriptyline (ELAVIL) 10 MG tablet Take 1 tablet (10 mg total) by mouth at bedtime.   Ascorbic Acid (VITAMIN C) 500 MG tablet Take 500 mg by mouth daily.   aspirin EC 81 MG tablet Take 81 mg by mouth daily.   azelastine (ASTELIN) 0.1 % nasal spray Place 2 sprays into both nostrils as needed. Use in each nostril as directed   Calcium Carbonate-Vitamin D (CALCIUM 600/VITAMIN D PO) Take 1 tablet by mouth 2 (two) times daily.   Diclofenac Sodium (VOLTAREN EX) Apply topically as needed.   ezetimibe (ZETIA) 10 MG tablet TAKE 1 TABLET(10 MG) BY MOUTH DAILY   fish oil-omega-3 fatty acids 1000 MG capsule Take 2 g by mouth daily.   fluticasone (FLONASE) 50 MCG/ACT nasal spray Place into both nostrils daily.   melatonin 5 MG TABS Take 5 mg by mouth at bedtime as needed.   metoprolol tartrate (LOPRESSOR) 25 MG tablet TAKE 1/2 TABLET BY MOUTH TWICE DAILY   ondansetron (ZOFRAN) 4 MG tablet Take 1 tablet (4 mg total) by mouth every 8 (eight) hours as needed.   pantoprazole  (PROTONIX) 40 MG tablet Take 1 tablet (40 mg total) by mouth 2 (two) times daily before a meal. 30 min before food   simvastatin (ZOCOR) 40 MG tablet TAKE 1 TABLET(40 MG) BY MOUTH DAILY   vitamin E 400 UNIT capsule Take 400 Units by mouth daily.    Past Medical History:  Diagnosis Date   Anxiety    Aortic atherosclerosis ([place])    Basal cell carcinoma 03/2019   right upper forehead, MOHS   Basal cell carcinoma 10/2012   Left upper back   COVID-19    04/11/21 tx'ed paxlovid on call Dr. Scarlette Calico   Diverticulosis    Fatty liver 2010   GERD (gastroesophageal reflux disease)    H. pylori infection    Hemangioma 2010   Hiatal hernia    Hyperlipidemia    Hypertension    Liver hemangioma    Splenic artery aneurysm ([place])    Tubular adenoma of colon     Past Surgical History:  Procedure Laterality Date   bilateral lens replacement     CHOLECYSTECTOMY     COLONOSCOPY  2012   KNEE SURGERY  06/29/2007   Meniscus tear, left knee surgery   UPPER GASTROINTESTINAL ENDOSCOPY      Social History Social History   Tobacco Use   Smoking status: Never  Smokeless tobacco: Never  Vaping Use   Vaping Use: Never used  Substance Use Topics   Alcohol use: No   Drug use: No    Family History Family History  Problem Relation Age of Onset   Hypertension Sister    Skin cancer Sister    Hypertension Sister    Heart attack Sister        9 y.o as of 09/10/19    Anxiety disorder Sister    Hypertension Brother    Colon cancer Neg Hx    Colon polyps Neg Hx    Esophageal cancer Neg Hx    Rectal cancer Neg Hx    Stomach cancer Neg Hx     Allergies  Allergen Reactions   Dexilant [Dexlansoprazole] Diarrhea   Sulfonamide Derivatives     REACTION: as child     REVIEW OF SYSTEMS (Negative unless checked)  Constitutional: '[]'$ Weight loss  '[]'$ Fever  '[]'$ Chills Cardiac: '[]'$ Chest pain   '[]'$ Chest pressure   '[]'$ Palpitations   '[]'$ Shortness of breath when laying flat   '[]'$ Shortness of breath with  exertion. Vascular:  '[x]'$ Pain in legs with walking   '[]'$ Pain in legs at rest  '[]'$ History of DVT   '[]'$ Phlebitis   '[]'$ Swelling in legs   '[]'$ Varicose veins   '[]'$ Non-healing ulcers Pulmonary:   '[]'$ Uses home oxygen   '[]'$ Productive cough   '[]'$ Hemoptysis   '[]'$ Wheeze  '[]'$ COPD   '[]'$ Asthma Neurologic:  '[]'$ Dizziness   '[]'$ Seizures   '[]'$ History of stroke   '[]'$ History of TIA  '[]'$ Aphasia   '[]'$ Vissual changes   '[]'$ Weakness or numbness in arm   '[]'$ Weakness or numbness in leg Musculoskeletal:   '[]'$ Joint swelling   '[]'$ Joint pain   '[]'$ Low back pain Hematologic:  '[]'$ Easy bruising  '[]'$ Easy bleeding   '[]'$ Hypercoagulable state   '[]'$ Anemic Gastrointestinal:  '[]'$ Diarrhea   '[]'$ Vomiting  '[]'$ Gastroesophageal reflux/heartburn   '[]'$ Difficulty swallowing. Genitourinary:  '[]'$ Chronic kidney disease   '[]'$ Difficult urination  '[]'$ Frequent urination   '[]'$ Blood in urine Skin:  '[]'$ Rashes   '[]'$ Ulcers  Psychological:  '[]'$ History of anxiety   '[]'$  History of major depression.  Physical Examination  Vitals:   08/05/22 1112  BP: 131/77  Pulse: 75  Resp: 17  Weight: 177 lb 9.6 oz (80.6 kg)  Height: '5\' 1"'$  (1.549 m)   Body mass index is 33.56 kg/m. Gen: WD/WN, NAD Head: [place]/AT, No temporalis wasting.  Ear/Nose/Throat: Hearing grossly intact, nares w/o erythema or drainage Eyes: PER, EOMI, sclera nonicteric.  Neck: Supple, no masses.  No bruit or JVD.  Pulmonary:  Good air movement, no audible wheezing, no use of accessory muscles.  Cardiac: RRR, normal S1, S2, no Murmurs. Vascular:  Moderate venous stasis skin changes with scattered varicosities, no open wounds Vessel Right Left  Radial Palpable Palpable  PT Palpable Palpable  DP  Palpable Palpable  Gastrointestinal: soft, non-distended. No guarding/no peritoneal signs.  Musculoskeletal: M/S 5/5 throughout.  No visible deformity.  Neurologic: CN 2-12 intact. Pain and light touch intact in extremities.  Symmetrical.  Speech is fluent. Motor exam as listed above. Psychiatric: Judgment intact, Mood & affect appropriate  for pt's clinical situation. Dermatologic: No rashes or ulcers noted.  No changes consistent with cellulitis.   CBC Lab Results  Component Value Date   WBC 4.8 06/26/2022   HGB 13.2 06/26/2022   HCT 38.7 06/26/2022   MCV 93.6 06/26/2022   PLT 161.0 06/26/2022    BMET    Component Value Date/Time   NA 139 06/26/2022 1214   NA 139 06/07/2013 1616  K 4.0 06/26/2022 1214   K 4.2 06/07/2013 1616   CL 104 06/26/2022 1214   CL 108 (H) 06/07/2013 1616   CO2 27 06/26/2022 1214   CO2 26 06/07/2013 1616   GLUCOSE 85 06/26/2022 1214   GLUCOSE 96 06/07/2013 1616   BUN 11 06/26/2022 1214   BUN 13 06/07/2013 1616   CREATININE 0.69 06/26/2022 1214   CREATININE 0.77 06/07/2013 1616   CALCIUM 9.0 06/26/2022 1214   CALCIUM 9.1 06/07/2013 1616   GFRNONAA >60 09/11/2019 0611   GFRNONAA >60 06/07/2013 1616   GFRAA >60 09/11/2019 0611   GFRAA >60 06/07/2013 1616   CrCl cannot be calculated (Patient's most recent lab result is older than the maximum 21 days allowed.).  COAG No results found for: "INR", "PROTIME"  Radiology DG Lumbar Spine Complete  Result Date: 07/12/2022 CLINICAL DATA:  Low back pain, right side. EXAM: LUMBAR SPINE - COMPLETE 4+ VIEW COMPARISON:  03/19/2022. FINDINGS: There is no evidence of lumbar spine fracture. There stairstep retrolisthesis at L1-L2, L2-L3, and L3-L4. Multilevel intervertebral disc space narrowing with degenerative endplate changes are noted at L1 and L2. Facet arthropathy is present throughout the lumbar spine. A calcification is noted in the left upper quadrant, compatible with known rim calcified splenic artery aneurysm. Cholecystectomy clips are present in the right upper quadrant. IMPRESSION: Multilevel degenerative changes. Electronically Signed   By: Brett Fairy M.D.   On: 07/12/2022 20:20     Assessment/Plan 1. Splenic artery aneurysm (HCC) No surgery or intervention at this time.  The patient has an asymptomatic splenic artery aneurysm  that is less than 2.5 cm in maximal diameter.  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 This has been stable for more than 10 years.  No further follow up is indicated at this time.   I have also discussed optimizing medical management of co-morbidities such as  hypertension and lipid control as well as the importance of abstinence from tobacco if the patient is a current smoker.  The patient is also encouraged to exercise a minimum of 30 minutes 4 times a week.   Should the patient develop new onset abdominal or back pain they are instructed to seek medical attention immediately and to alert the physician providing care that they have an aneurysm.   The patient voices their understanding.  The patient will return PRN.    2. Chronic venous insufficiency No surgery or intervention at this point in time.    I have discussed with the patient venous insufficiency and why it  causes symptoms. I have discussed with the patient the chronic skin changes that accompany venous insufficiency and the long term sequela such as infection and ulceration.  Patient will begin wearing graduated compression stockings or compression wraps on a daily basis.  The patient will put the compression on first thing in the morning and removing them in the evening. The patient is instructed specifically not to sleep in the compression.    In addition, behavioral modification including several periods of elevation of the lower extremities during the day will be continued. I have demonstrated that proper elevation is a position with the ankles at heart level.  The patient is instructed to begin routine exercise, especially walking on a daily basis  3. Essential hypertension Continue antihypertensive medications as already ordered, these medications have been reviewed and there are no changes at this time.   4. Coronary artery disease involving native coronary artery of native  heart without  angina pectoris Continue cardiac and antihypertensive medications as already ordered and reviewed, no changes at this time.  Continue statin as ordered and reviewed, no changes at this time  Nitrates PRN for chest pain   5. Gastroesophageal reflux disease, unspecified whether esophagitis present Continue PPI as already ordered, this medication has been reviewed and there are no changes at this time.  Avoidence of caffeine and alcohol  Moderate elevation of the head of the bed    6. HYPERLIPIDEMIA, MIXED Continue statin as ordered and reviewed, no changes at this time     Hortencia Pilar, MD  08/05/2022 11:18 AM

## 2022-08-22 DIAGNOSIS — Z6833 Body mass index (BMI) 33.0-33.9, adult: Secondary | ICD-10-CM | POA: Diagnosis not present

## 2022-08-22 DIAGNOSIS — Z1231 Encounter for screening mammogram for malignant neoplasm of breast: Secondary | ICD-10-CM | POA: Diagnosis not present

## 2022-08-22 DIAGNOSIS — Z01419 Encounter for gynecological examination (general) (routine) without abnormal findings: Secondary | ICD-10-CM | POA: Diagnosis not present

## 2022-08-26 ENCOUNTER — Encounter (INDEPENDENT_AMBULATORY_CARE_PROVIDER_SITE_OTHER): Payer: Self-pay

## 2022-09-25 ENCOUNTER — Other Ambulatory Visit: Payer: Self-pay

## 2022-09-25 ENCOUNTER — Ambulatory Visit
Admission: EM | Admit: 2022-09-25 | Discharge: 2022-09-25 | Disposition: A | Discharge status: Home / Self Care | Payer: Medicare PPO | Attending: Urgent Care | Admitting: Urgent Care

## 2022-09-25 DIAGNOSIS — R6889 Other general symptoms and signs: Secondary | ICD-10-CM | POA: Diagnosis not present

## 2022-09-25 DIAGNOSIS — J019 Acute sinusitis, unspecified: Secondary | ICD-10-CM | POA: Diagnosis not present

## 2022-09-25 DIAGNOSIS — B9789 Other viral agents as the cause of diseases classified elsewhere: Secondary | ICD-10-CM | POA: Diagnosis not present

## 2022-09-25 MED ORDER — PREDNISONE 20 MG PO TABS: 40.0000 mg | ORAL_TABLET | Freq: Every day | ORAL | 0 refills | Status: AC

## 2022-09-25 NOTE — Discharge Instructions (Addendum)
Your symptoms are most likely caused by a viral infection.  I recommend switching to Sudafed sinus (pseudoephedrine) or a generic equivalent.  Please asked the pharmacist for this medication which is stored behind the counter though it is not prescribed.  I have prescribed a course of prednisone in the event you do not have any relief of your sinus pressure from Sudafed.  You should wait until tomorrow morning to take this medication in order to avoid sleep disturbance.  Follow up here or with your primary care provider if your symptoms are worsening or not improving.

## 2022-09-25 NOTE — ED Provider Notes (Signed)
Roderic Palau    CSN: [id number] Arrival date & time: 09/25/22  1035      History   Chief Complaint Chief Complaint  Patient presents with   Chills   Sore Throat   Generalized Body Aches   Otalgia    HPI Leah Melendez is a 76 y.o. female.    Sore Throat  Otalgia   Presents to urgent care with complaint of sinus pressure and pain, chills, generalized bodyaches, sore throat x 2 days.  She is using "Tylenol sinus" for relief of her sinus symptoms and states it has not been helping much.  Past Medical History:  Diagnosis Date   Anxiety    Aortic atherosclerosis ([place])    Basal cell carcinoma 03/2019   right upper forehead, MOHS   Basal cell carcinoma 10/2012   Left upper back   COVID-19    04/11/21 tx'ed paxlovid on call Dr. Scarlette Calico   Diverticulosis    Fatty liver 2010   GERD (gastroesophageal reflux disease)    H. 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   Facet hypertrophy of lumbar region 07/01/2022   Splenic artery aneurysm ([place]) 03/20/2022   Diverticulosis 03/20/2022   Hematuria 02/28/2022   Chronic right-sided low back pain without sciatica 02/26/2022   Bronchitis due to COVID-19 virus 04/11/2021   Arthritis of knee 11/07/2020   Chronic pain of both knees 08/14/2020   Primary osteoarthritis of both knees 08/08/2020   Obesity (BMI 30-39.9) 08/08/2020   Aortic atherosclerosis ([place]) 04/01/2020   Pure hypercholesterolemia 04/01/2020   Neuropathy 03/16/2020   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   Abnormal EEG 03/16/2020   Insomnia 03/16/2020   CAD (coronary artery disease) 10/26/2019   Lymphedema 09/10/2019   Sinus tachycardia 09/10/2019   Dizziness 08/19/2019    Abnormal EKG 08/19/2019   Essential hypertension 08/19/2019   Anxiety 07/29/2019   Exertional dyspnea 07/15/2019   Syncope 07/15/2019   Osteopenia 07/01/2019   Vitamin D deficiency 08/17/2018   Osteoarthritis, hand, primary localized 07/21/2014   HIATAL HERNIA 11/30/2008   Disorder of bone and cartilage 11/30/2008   GERD 06/01/2008   HYPERLIPIDEMIA, MIXED 03/19/2007    Past Surgical History:  Procedure Laterality Date   bilateral lens replacement     CHOLECYSTECTOMY     COLONOSCOPY  2012   KNEE SURGERY  06/29/2007   Meniscus tear, left knee surgery   UPPER GASTROINTESTINAL ENDOSCOPY      OB History   No obstetric history on file.      Home Medications    Prior to Admission medications   Medication Sig Start Date End Date Taking? Authorizing Provider  albuterol (VENTOLIN HFA) 108 (90 Base) MCG/ACT inhaler INHALE 1 TO 2 PUFFS INTO THE LUNGS EVERY 6 HOURS AS NEEDED FOR WHEEZING OR SHORTNESS OF BREATH OR COUGH 02/26/22   McLean-Scocuzza, Nino Glow, MD  amitriptyline (ELAVIL) 10 MG tablet Take 1 tablet (10 mg total) by mouth at bedtime. 07/19/22   Stoioff, Ronda Fairly, MD  Ascorbic Acid (VITAMIN C) 500 MG tablet Take 500 mg by mouth daily.    [provider]  aspirin EC 81 MG tablet Take 81 mg by  mouth daily.    [provider]  azelastine (ASTELIN) 0.1 % nasal spray Place 2 sprays into both nostrils as needed. Use in each nostril as directed    [provider]  Calcium Carbonate-Vitamin D (CALCIUM 600/VITAMIN D PO) Take 1 tablet by mouth 2 (two) times daily.    [provider]  Diclofenac Sodium (VOLTAREN EX) Apply topically as needed.    [provider]  ezetimibe (ZETIA) 10 MG tablet TAKE 1 TABLET(10 MG) BY MOUTH DAILY 02/26/22   McLean-Scocuzza, Nino Glow, MD  fish oil-omega-3 fatty acids 1000 MG capsule Take 2 g by mouth daily.    [provider]  fluticasone (FLONASE) 50 MCG/ACT nasal spray Place into both nostrils daily.     [provider]  melatonin 5 MG TABS Take 5 mg by mouth at bedtime as needed.    [provider]  metoprolol tartrate (LOPRESSOR) 25 MG tablet TAKE 1/2 TABLET BY MOUTH TWICE DAILY 07/17/22   McLean-Scocuzza, Nino Glow, MD  ondansetron (ZOFRAN) 4 MG tablet Take 1 tablet (4 mg total) by mouth every 8 (eight) hours as needed. 06/26/22   McLean-Scocuzza, Nino Glow, MD  pantoprazole (PROTONIX) 40 MG tablet Take 1 tablet (40 mg total) by mouth 2 (two) times daily before a meal. 30 min before food 07/05/22   Levin Erp, PA  simvastatin (ZOCOR) 40 MG tablet TAKE 1 TABLET(40 MG) BY MOUTH DAILY 02/26/22   McLean-Scocuzza, Nino Glow, MD  vitamin E 400 UNIT capsule Take 400 Units by mouth daily.    [provider]    Family History Family History  Problem Relation Age of Onset   Hypertension Sister    Skin cancer Sister    Hypertension Sister    Heart attack Sister        19 y.o as of 09/10/19    Anxiety disorder Sister    Hypertension Brother    Colon cancer Neg Hx    Colon polyps Neg Hx    Esophageal cancer Neg Hx    Rectal cancer Neg Hx    Stomach cancer Neg Hx     Social History Social History   Tobacco Use   Smoking status: Never   Smokeless tobacco: Never  Vaping Use   Vaping Use: Never used  Substance Use Topics   Alcohol use: No   Drug use: No     Allergies   Dexilant [dexlansoprazole] and Sulfonamide derivatives   Review of Systems Review of Systems  HENT:  Positive for ear pain.      Physical Exam Triage Vital Signs ED Triage Vitals [09/25/22 1203]  Enc Vitals Group     BP (!) 150/80     Pulse Rate 75     Resp 17     Temp 98.3 F (36.8 C)     Temp src      SpO2 97 %     Weight      Height      Head Circumference      Peak Flow      Pain Score 5     Pain Loc      Pain Edu?      Excl. in [place]?    No data found.  Updated Vital Signs BP (!) 150/80   Pulse 75   Temp 98.3 F (36.8 C)   Resp 17   SpO2 97%   Visual  Acuity Right Eye Distance:   Left Eye Distance:   Bilateral Distance:  Right Eye Near:   Left Eye Near:    Bilateral Near:     Physical Exam Vitals reviewed.  Constitutional:      Appearance: She is well-developed. She is ill-appearing. She is not toxic-appearing.  HENT:     Nose:     Right Sinus: Maxillary sinus tenderness present.     Left Sinus: Maxillary sinus tenderness present.     Mouth/Throat:     Mouth: Mucous membranes are moist.  Cardiovascular:     Rate and Rhythm: Normal rate and regular rhythm.  Pulmonary:     Effort: Pulmonary effort is normal.     Breath sounds: Normal breath sounds.  Skin:    General: Skin is warm and dry.  Neurological:     General: No focal deficit present.     Mental Status: She is alert.  Psychiatric:        Mood and Affect: Mood normal.        Behavior: Behavior normal.      UC Treatments / Results  Labs (all labs ordered are listed, but only abnormal results are displayed) Labs Reviewed - No data to display  EKG   Radiology No results found.  Procedures Procedures (including critical care time)  Medications Ordered in UC Medications - No data to display  Initial Impression / Assessment and Plan / UC Course  I have reviewed the triage vital signs and the nursing notes.  Pertinent labs & imaging results that were available during my care of the patient were reviewed by me and considered in my medical decision making (see chart for details).   Patient is afebrile here without recent antipyretics. Satting well on room air. Overall is ill appearing though not toxic appearing. She appears well hydrated, and is without respiratory distress. Pulmonary exam is unremarkable.  Maxillary sinus tenderness is present with palpation.  Suspect viral pathology, possibly flu or COVID.  Results of respiratory swab is pending.  Symptoms are causing acute sinusitis, likely viral.  Recommend using "Sudafed sinus" (pseudoephedrine) in  place of Tylenol sinus.  Asked her to check the active ingredients to verify Tylenol was not present before taking separate Tylenol for control of other symptoms including body aches and chills.  Will prescribe a course of prednisone to be used starting tomorrow if she has no improvement from Sudafed.  She endorses previous use of prednisone without significant side effects or intolerance.  Final Clinical Impressions(s) / UC Diagnoses   Final diagnoses:  None   Discharge Instructions   None    ED Prescriptions   None    PDMP not reviewed this encounter.   Rose Phi,  09/25/22 1227

## 2022-09-25 NOTE — ED Triage Notes (Signed)
Pt. Presents to UC w/ c/o chills, generalized body aches, and a sore throat for the past 2 days.

## 2022-09-27 ENCOUNTER — Telehealth: Payer: Self-pay

## 2022-09-27 NOTE — Telephone Encounter (Signed)
TC to pt advised unable to obtain result for 11/29 resp panel swab.  Pt improved but continues to have some s/s.  Pt will RTC 12/02 for nurse visit to reswab.

## 2022-09-28 ENCOUNTER — Ambulatory Visit
Admission: EM | Admit: 2022-09-28 | Discharge: 2022-09-28 | Disposition: A | Discharge status: Home / Self Care | Payer: Medicare PPO | Attending: Urgent Care | Admitting: Urgent Care

## 2022-09-28 DIAGNOSIS — U071 COVID-19: Secondary | ICD-10-CM | POA: Diagnosis not present

## 2022-09-28 DIAGNOSIS — R52 Pain, unspecified: Secondary | ICD-10-CM | POA: Diagnosis present

## 2022-09-28 HISTORY — DX: Pain, unspecified: R52

## 2022-09-28 LAB — RESP PANEL BY RT-PCR (RSV, FLU A&B, COVID)  RVPGX2
Influenza A by PCR: NEGATIVE
Influenza B by PCR: NEGATIVE
Resp Syncytial Virus by PCR: NEGATIVE
SARS Coronavirus 2 by RT PCR: POSITIVE — AB

## 2022-09-28 NOTE — ED Triage Notes (Signed)
Patient arrives for recollection of nasal swab.   Order placed for covid/ flu/ RSV via PCR under provider NP Annie Main Immordino.

## 2022-10-04 LAB — COVID-19, FLU A+B AND RSV
Influenza A, NAA: NOT DETECTED
Influenza B, NAA: NOT DETECTED
RSV, NAA: NOT DETECTED
SARS-CoV-2, NAA: DETECTED — AB

## 2022-10-04 LAB — SPECIMEN STATUS REPORT

## 2022-12-10 ENCOUNTER — Ambulatory Visit
Admission: EM | Admit: 2022-12-10 | Discharge: 2022-12-10 | Disposition: A | Discharge status: Home / Self Care | Payer: Medicare PPO | Attending: Urgent Care | Admitting: Urgent Care

## 2022-12-10 DIAGNOSIS — J0100 Acute maxillary sinusitis, unspecified: Secondary | ICD-10-CM

## 2022-12-10 DIAGNOSIS — R42 Dizziness and giddiness: Secondary | ICD-10-CM

## 2022-12-10 MED ORDER — MECLIZINE HCL 25 MG PO TABS: 25.0000 mg | ORAL_TABLET | Freq: Three times a day (TID) | ORAL | 0 refills | Status: DC | PRN

## 2022-12-10 MED ORDER — PREDNISONE 20 MG PO TABS: ORAL_TABLET | ORAL | 0 refills | Status: AC

## 2022-12-10 NOTE — Discharge Instructions (Signed)
Follow up here or with your primary care provider if your symptoms are worsening or not improving.     

## 2022-12-10 NOTE — ED Triage Notes (Signed)
Patient presents to UC for dizziness since last night. Ear fullness and HA since today. Hx of vertigo, out of meclazine. Used OTC ear drops with no relief.   Denies chest pain, SOB, changes to vision.

## 2022-12-10 NOTE — ED Provider Notes (Signed)
Leah Melendez    CSN: [id number] Arrival date & time: 12/10/22  1308      History   Chief Complaint Chief Complaint  Patient presents with   Dizziness   Ear Fullness   Headache    HPI Leah Melendez is a 77 y.o. female.    Dizziness Associated symptoms: headaches   Ear Fullness Associated symptoms include headaches.  Headache Associated symptoms: dizziness     Presents to urgent care with complaint of dizziness starting last night.  She reports bilateral ear fullness and headache.  Also reports nasal discharge (clear) and facial/sinus pressure.  Endorses history of vertigo but no recent episodes.  She uses meclizine but has no more doses available.  Denies chest pain, shortness of breath, vision changes.  Past Medical History:  Diagnosis Date   Anxiety    Aortic atherosclerosis ([place])    Basal cell carcinoma 03/2019   right upper forehead, MOHS   Basal cell carcinoma 10/2012   Left upper back   COVID-19    04/11/21 tx'ed paxlovid on call Dr. Scarlette Calico   Diverticulosis    Fatty liver 2010   GERD (gastroesophageal reflux disease)    H. pylori infection    Hemangioma 2010   Hiatal hernia    Hyperlipidemia    Hypertension    Liver hemangioma    Splenic artery aneurysm ([place])    Tubular adenoma of colon     Patient Active Problem List   Diagnosis Date Noted   Body aches 09/28/2022   Chronic venous insufficiency 08/05/2022   Arthritis of lumbar spine 07/15/2022   Facet hypertrophy of lumbar region 07/01/2022   Splenic artery aneurysm ([place]) 03/20/2022   Diverticulosis 03/20/2022   Hematuria 02/28/2022   Chronic right-sided low back pain without sciatica 02/26/2022   Bronchitis due to COVID-19 virus 04/11/2021   Arthritis of knee 11/07/2020   Chronic pain of both knees 08/14/2020   Primary osteoarthritis of both knees 08/08/2020   Obesity (BMI 30-39.9) 08/08/2020   Aortic atherosclerosis ([place]) 04/01/2020   Pure hypercholesterolemia  04/01/2020   Neuropathy 03/16/2020   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   Abnormal EEG 03/16/2020   Insomnia 03/16/2020   CAD (coronary artery disease) 10/26/2019   Lymphedema 09/10/2019   Sinus tachycardia 09/10/2019   Dizziness 08/19/2019   Abnormal EKG 08/19/2019   Essential hypertension 08/19/2019   Anxiety 07/29/2019   Exertional dyspnea 07/15/2019   Syncope 07/15/2019   Osteopenia 07/01/2019   Vitamin D deficiency 08/17/2018   Osteoarthritis, hand, primary localized 07/21/2014   HIATAL HERNIA 11/30/2008   Disorder of bone and cartilage 11/30/2008   GERD 06/01/2008   HYPERLIPIDEMIA, MIXED 03/19/2007    Past Surgical History:  Procedure Laterality Date   bilateral lens replacement     CHOLECYSTECTOMY     COLONOSCOPY  2012   KNEE SURGERY  06/29/2007   Meniscus tear, left knee surgery   UPPER GASTROINTESTINAL ENDOSCOPY      OB History   No obstetric history on file.      Home Medications    Prior to Admission medications   Medication Sig Start Date End Date Taking? Authorizing Provider  albuterol (VENTOLIN HFA) 108 (90 Base) MCG/ACT inhaler INHALE 1 TO 2 PUFFS INTO THE LUNGS EVERY 6 HOURS AS NEEDED FOR WHEEZING OR SHORTNESS OF BREATH OR COUGH 02/26/22   McLean-Scocuzza, Nino Glow, MD  amitriptyline (ELAVIL) 10 MG tablet Take 1 tablet (10 mg total) by mouth at bedtime. 07/19/22   Stoioff, Nicki Reaper  C, MD  Ascorbic Acid (VITAMIN C) 500 MG tablet Take 500 mg by mouth daily.    [provider]  aspirin EC 81 MG tablet Take 81 mg by mouth daily.    [provider]  azelastine (ASTELIN) 0.1 % nasal spray Place 2 sprays into both nostrils as needed. Use in each nostril as directed    [provider]  Calcium Carbonate-Vitamin D (CALCIUM 600/VITAMIN D PO) Take 1 tablet by mouth 2 (two) times daily.    [provider]  Diclofenac Sodium (VOLTAREN EX) Apply topically as needed.    [provider]  ezetimibe (ZETIA) 10 MG tablet TAKE 1  TABLET(10 MG) BY MOUTH DAILY 02/26/22   McLean-Scocuzza, Nino Glow, MD  fish oil-omega-3 fatty acids 1000 MG capsule Take 2 g by mouth daily.    [provider]  fluticasone (FLONASE) 50 MCG/ACT nasal spray Place into both nostrils daily.    [provider]  melatonin 5 MG TABS Take 5 mg by mouth at bedtime as needed.    [provider]  metoprolol tartrate (LOPRESSOR) 25 MG tablet TAKE 1/2 TABLET BY MOUTH TWICE DAILY 07/17/22   McLean-Scocuzza, Nino Glow, MD  ondansetron (ZOFRAN) 4 MG tablet Take 1 tablet (4 mg total) by mouth every 8 (eight) hours as needed. 06/26/22   McLean-Scocuzza, Nino Glow, MD  pantoprazole (PROTONIX) 40 MG tablet Take 1 tablet (40 mg total) by mouth 2 (two) times daily before a meal. 30 min before food 07/05/22   Levin Erp, PA  simvastatin (ZOCOR) 40 MG tablet TAKE 1 TABLET(40 MG) BY MOUTH DAILY 02/26/22   McLean-Scocuzza, Nino Glow, MD  vitamin E 400 UNIT capsule Take 400 Units by mouth daily.    [provider]    Family History Family History  Problem Relation Age of Onset   Hypertension Sister    Skin cancer Sister    Hypertension Sister    Heart attack Sister        58 y.o as of 09/10/19    Anxiety disorder Sister    Hypertension Brother    Colon cancer Neg Hx    Colon polyps Neg Hx    Esophageal cancer Neg Hx    Rectal cancer Neg Hx    Stomach cancer Neg Hx     Social History Social History   Tobacco Use   Smoking status: Never   Smokeless tobacco: Never  Vaping Use   Vaping Use: Never used  Substance Use Topics   Alcohol use: No   Drug use: No     Allergies   Dexilant [dexlansoprazole] and Sulfonamide derivatives   Review of Systems Review of Systems  Neurological:  Positive for dizziness and headaches.     Physical Exam Triage Vital Signs ED Triage Vitals [12/10/22 1346]  Enc Vitals Group     BP (!) 161/88     Pulse Rate 77     Resp 16     Temp 97.8 F (36.6 C)     Temp Source Temporal      SpO2 96 %     Weight      Height      Head Circumference      Peak Flow      Pain Score 3     Pain Loc      Pain Edu?      Excl. in [place]?    No data found.  Updated Vital Signs BP (!) 161/88 (BP Location: Left Arm)  Pulse 77   Temp 97.8 F (36.6 C) (Temporal)   Resp 16   SpO2 96%   Visual Acuity Right Eye Distance:   Left Eye Distance:   Bilateral Distance:    Right Eye Near:   Left Eye Near:    Bilateral Near:     Physical Exam Vitals reviewed.  Constitutional:      Appearance: She is well-developed.  HENT:     Right Ear: Tympanic membrane normal.     Left Ear: Tympanic membrane normal.  Cardiovascular:     Rate and Rhythm: Regular rhythm.  Skin:    General: Skin is warm and dry.  Neurological:     Mental Status: She is alert and oriented to person, place, and time.  Psychiatric:        Mood and Affect: Mood normal.        Behavior: Behavior normal.      UC Treatments / Results  Labs (all labs ordered are listed, but only abnormal results are displayed) Labs Reviewed - No data to display  EKG   Radiology No results found.  Procedures Procedures (including critical care time)  Medications Ordered in UC Medications - No data to display  Initial Impression / Assessment and Plan / UC Course  I have reviewed the triage vital signs and the nursing notes.  Pertinent labs & imaging results that were available during my care of the patient were reviewed by me and considered in my medical decision making (see chart for details).   Patient is afebrile here without recent antipyretics. Satting well on room air. Overall is well appearing, well hydrated, without respiratory distress.  TMs are WNL bilaterally.  Given patient's symptoms per HPI, suspect sinusitis, possibly allergic, as the cause of her ear fullness and pain, resulting in vertigo like symptoms.  Encouraged her to use her daily allergy medication, loratadine.  Also suggested anti-inflammatory  therapy for relief of acute symptoms of sinusitis.  She states she has used prednisone in the past without any concerning side effects.   Final Clinical Impressions(s) / UC Diagnoses   Final diagnoses:  None   Discharge Instructions   None    ED Prescriptions   None    PDMP not reviewed this encounter.   Rose Phi, [place] 12/10/22 1406

## 2022-12-26 ENCOUNTER — Ambulatory Visit: Payer: Medicare PPO | Admitting: Family Medicine

## 2022-12-26 ENCOUNTER — Encounter: Payer: Self-pay | Admitting: Family Medicine

## 2022-12-26 VITALS — BP 118/78 | HR 93 | Temp 97.7°F | Ht 61.0 in | Wt 178.2 lb

## 2022-12-26 DIAGNOSIS — I10 Essential (primary) hypertension: Secondary | ICD-10-CM

## 2022-12-26 DIAGNOSIS — E559 Vitamin D deficiency, unspecified: Secondary | ICD-10-CM | POA: Diagnosis not present

## 2022-12-26 DIAGNOSIS — R7309 Other abnormal glucose: Secondary | ICD-10-CM | POA: Diagnosis not present

## 2022-12-26 DIAGNOSIS — I872 Venous insufficiency (chronic) (peripheral): Secondary | ICD-10-CM

## 2022-12-26 DIAGNOSIS — I728 Aneurysm of other specified arteries: Secondary | ICD-10-CM

## 2022-12-26 DIAGNOSIS — H65191 Other acute nonsuppurative otitis media, right ear: Secondary | ICD-10-CM

## 2022-12-26 DIAGNOSIS — E782 Mixed hyperlipidemia: Secondary | ICD-10-CM | POA: Diagnosis not present

## 2022-12-26 DIAGNOSIS — I700 Atherosclerosis of aorta: Secondary | ICD-10-CM

## 2022-12-26 DIAGNOSIS — G629 Polyneuropathy, unspecified: Secondary | ICD-10-CM | POA: Diagnosis not present

## 2022-12-26 LAB — LIPID PANEL
Cholesterol: 158 mg/dL (ref 0–200)
HDL: 53.8 mg/dL (ref >39.00)
LDL Cholesterol: 77 mg/dL (ref 0–99)
NonHDL: 103.87
Total CHOL/HDL Ratio: 3
Triglycerides: 135 mg/dL (ref 0.0–149.0)
VLDL: 27 mg/dL (ref 0.0–40.0)

## 2022-12-26 LAB — CBC WITH DIFFERENTIAL/PLATELET
Basophils Absolute: 0.1 10*3/uL (ref 0.0–0.1)
Basophils Relative: 0.9 % (ref 0.0–3.0)
Eosinophils Absolute: 0.1 10*3/uL (ref 0.0–0.7)
Eosinophils Relative: 2.3 % (ref 0.0–5.0)
HCT: 39.4 % (ref 36.0–46.0)
Hemoglobin: 13.4 g/dL (ref 12.0–15.0)
Lymphocytes Relative: 23.1 % (ref 12.0–46.0)
Lymphs Abs: 1.4 10*3/uL (ref 0.7–4.0)
MCHC: 34 g/dL (ref 30.0–36.0)
MCV: 92.3 fl (ref 78.0–100.0)
Monocytes Absolute: 0.5 10*3/uL (ref 0.1–1.0)
Monocytes Relative: 8.9 % (ref 3.0–12.0)
Neutro Abs: 3.9 10*3/uL (ref 1.4–7.7)
Neutrophils Relative %: 64.8 % (ref 43.0–77.0)
Platelets: 160 10*3/uL (ref 150.0–400.0)
RBC: 4.27 Mil/uL (ref 3.87–5.11)
RDW: 12.6 % (ref 11.5–15.5)
WBC: 5.9 10*3/uL (ref 4.0–10.5)

## 2022-12-26 LAB — COMPREHENSIVE METABOLIC PANEL
ALT: 10 U/L (ref 0–35)
AST: 13 U/L (ref 0–37)
Albumin: 3.5 g/dL (ref 3.5–5.2)
Alkaline Phosphatase: 58 U/L (ref 39–117)
BUN: 14 mg/dL (ref 6–23)
CO2: 25 mEq/L (ref 19–32)
Calcium: 9.1 mg/dL (ref 8.4–10.5)
Chloride: 107 mEq/L (ref 96–112)
Creatinine, Ser: 0.74 mg/dL (ref 0.40–1.20)
GFR: 77.57 mL/min (ref >60.00)
Glucose, Bld: 88 mg/dL (ref 70–99)
Potassium: 4.2 mEq/L (ref 3.5–5.1)
Sodium: 140 mEq/L (ref 135–145)
Total Bilirubin: 1.2 mg/dL (ref 0.2–1.2)
Total Protein: 6.1 g/dL (ref 6.0–8.3)

## 2022-12-26 LAB — TSH: TSH: 3.95 u[IU]/mL (ref 0.35–5.50)

## 2022-12-26 LAB — VITAMIN D 25 HYDROXY (VIT D DEFICIENCY, FRACTURES): VITD: 22.67 ng/mL — ABNORMAL LOW (ref 30.00–100.00)

## 2022-12-26 LAB — VITAMIN B12: Vitamin B-12: >1500 pg/mL — ABNORMAL HIGH (ref 211–911)

## 2022-12-26 MED ORDER — AMOXICILLIN-POT CLAVULANATE 875-125 MG PO TABS: 1.0000 | ORAL_TABLET | Freq: Two times a day (BID) | ORAL | 0 refills | Status: AC

## 2022-12-26 MED ORDER — SACCHAROMYCES BOULARDII 250 MG PO CAPS: 250.0000 mg | ORAL_CAPSULE | Freq: Every day | ORAL | 0 refills | Status: DC

## 2022-12-26 NOTE — Assessment & Plan Note (Signed)
Chronic.  Stable.  Stable 17 mm splenic artery aneurysm noted on CT abdomen 03/19/22.  No surgical intervention at this time. Monitor for acute onset of abdominal or back pain. Follows with vascular surgery.

## 2022-12-26 NOTE — Assessment & Plan Note (Signed)
Chronic.  Stable.  Compliant medication. Continue metoprolol 12.5 mg twice daily Monitor blood pressure at home goal less than 140/80 per JNC 8 guideline for age.

## 2022-12-26 NOTE — Assessment & Plan Note (Signed)
Chronic.  Stable.  Compliant with statin therapy.  No myalgias. LDL at goal less than 70 Continue Zetia 10 mg daily Continue Zocor 40 mg daily. Repeat lipids annually

## 2022-12-26 NOTE — Patient Instructions (Addendum)
It was a pleasure meeting you today. Thank you for allowing me to take part in your health care.  Our goals for today as we discussed include:  Will get some lab work today.    Schedule Medicare annual wellness visit If you have any questions or concerns, please do not hesitate to call the office at [phone].  Schedule annual physical in March or April of this year   Start Augmentin 1 tablet 2 times a day for 5 days Start probiotics daily and continue for 2 weeks until completion of antibiotics.  I look forward to our next visit and until then take care and stay safe.  Regards,   Carollee Leitz, MD   [facility]

## 2022-12-26 NOTE — Progress Notes (Signed)
SUBJECTIVE:   Chief Complaint  Patient presents with   Transitions Of Care   HPI Patient presents to clinic to transfer care.  No acute concerns today.  Recently seen at urgent care on 02/13 for acute sinusitis, allergic and recommended antihistamine and anti-inflammatories.  She was prescribed meclizine and taper dose Pred pack.  She continues to have ear fullness and sinus pressure without any fevers.  No recent sick contacts.  History of vertigo. Not taking meclizine.  Reports dizziness from acute visit at urgent care had resolved.  She has been previously worked up for vertigo and was told it was benign.  Hypertension Asymptomatic.  Compliant with medication.  Currently takes Lopressor 12.5 mg twice daily and tolerating well.  Does not check blood pressures at home.  Hyperlipidemia On statin therapy and Zetia.  Takes Zocor 40 mg daily and Zetia 10 mg daily.  Tolerating medication well.  No myalgias.  GERD. Doing well on PPI.  Has been taking Protonix 40 mg twice daily for years. Last EGD 06/23.  Multiple sessile polyps in gastric fundus and gastric body, biopsied.  Medium size hiatal hernia present.  Normal esophagus normal duodenum.   Splenic artery aneurysm Stable 17 mm splenic artery aneurysm was noted on CT scan 05/23.  Stable for greater than 10 years.  She follows with vascular surgery.  Denies any abdominal pain or back pain.  Chronic venous insufficiency Follows with vascular.  No surgical intervention at this time.  Recommended compression stocking use.   PERTINENT PMH / PSH: Hypertension CAD Aortic atherosclerosis Neuropathy Hyperlipidemia OSA Splenic aneurysm  OBJECTIVE:  BP 118/78   Pulse 93   Temp 97.7 F (36.5 C) (Oral)   Ht '5\' 1"'$  (1.549 m)   Wt 178 lb 3.2 oz (80.8 kg)   SpO2 96%   BMI 33.67 kg/m    Physical Exam Vitals reviewed.  Constitutional:      General: She is not in acute distress.    Appearance: She is normal weight. She is not  ill-appearing.  HENT:     Head: Normocephalic.     Right Ear: A middle ear effusion is present. Tympanic membrane is bulging. Tympanic membrane is not perforated or erythematous.     Left Ear: Hearing normal. Tympanic membrane is not bulging.  Eyes:     Conjunctiva/sclera: Conjunctivae normal.  Cardiovascular:     Rate and Rhythm: Normal rate and regular rhythm.     Pulses: Normal pulses.  Pulmonary:     Effort: Pulmonary effort is normal.     Breath sounds: Normal breath sounds.  Abdominal:     General: Bowel sounds are normal.  Neurological:     Mental Status: She is alert. Mental status is at baseline.  Psychiatric:        Mood and Affect: Mood normal.        Behavior: Behavior normal.        Thought Content: Thought content normal.        Judgment: Judgment normal.     ASSESSMENT/PLAN:  Splenic artery aneurysm ([facility]) Assessment & Plan: Chronic.  Stable.  Stable 17 mm splenic artery aneurysm noted on CT abdomen 03/19/22.  No surgical intervention at this time. Monitor for acute onset of abdominal or back pain. Follows with vascular surgery.   Essential hypertension Assessment & Plan: Chronic.  Stable.  Compliant medication. Continue metoprolol 12.5 mg twice daily Monitor blood pressure at home goal less than 140/80 per JNC 8 guideline for age.  Orders: -     CBC with Differential/Platelet -     Comprehensive metabolic panel -     TSH  Aortic atherosclerosis ([facility]) Assessment & Plan: Chronic.  Stable.  Compliant with statin therapy.  No myalgias. LDL at goal less than 70 Continue Zetia 10 mg daily Continue Zocor 40 mg daily. Repeat lipids annually   HYPERLIPIDEMIA, MIXED Assessment & Plan: Chronic.  Stable. Continue Zocor 40 mg daily Continue Zetia 10 mg daily Repeat lipids at next visit  Orders: -     Lipid panel  Vitamin D deficiency Assessment & Plan: Vitamin D levels  Orders: -     VITAMIN D 25 Hydroxy (Vit-D Deficiency,  Fractures)  Neuropathy -     Vitamin B12  Abnormal glucose -     Hemoglobin A1c  Other non-recurrent acute nonsuppurative otitis media of right ear Assessment & Plan: Symptoms persisting from recent URI Bulging right TM without discharge or decrease in hearing.  She also continues to have ear pressure Start Augmentin x 5 days Start probiotics daily and continue for 2 weeks after completion of treatment. Follow-up if no improvement in symptoms.  Orders: -     Amoxicillin-Pot Clavulanate; Take 1 tablet by mouth 2 (two) times daily for 5 days.  Dispense: 10 tablet; Refill: 0 -     Saccharomyces boulardii; Take 1 capsule (250 mg total) by mouth daily.  Dispense: 90 capsule; Refill: 0  Chronic venous insufficiency Assessment & Plan: Chronic.  No surgical intervention at this time per vascular Continue use of compression stockings Elevate legs when able Continue to follow with vascular    PDMP reviewed  Return if symptoms worsen or fail to improve.  Carollee Leitz, MD

## 2022-12-27 LAB — HEMOGLOBIN A1C: Hgb A1c MFr Bld: 5.5 % (ref 4.6–6.5)

## 2022-12-28 ENCOUNTER — Encounter: Payer: Self-pay | Admitting: Family Medicine

## 2022-12-28 DIAGNOSIS — H6690 Otitis media, unspecified, unspecified ear: Secondary | ICD-10-CM | POA: Insufficient documentation

## 2022-12-28 DIAGNOSIS — R7309 Other abnormal glucose: Secondary | ICD-10-CM | POA: Insufficient documentation

## 2022-12-28 NOTE — Assessment & Plan Note (Signed)
Vitamin D levels

## 2022-12-28 NOTE — Assessment & Plan Note (Addendum)
Chronic.  Stable. Continue Zocor 40 mg daily Continue Zetia 10 mg daily Repeat lipids at next visit

## 2022-12-28 NOTE — Assessment & Plan Note (Signed)
Symptoms persisting from recent URI Bulging right TM without discharge or decrease in hearing.  She also continues to have ear pressure Start Augmentin x 5 days Start probiotics daily and continue for 2 weeks after completion of treatment. Follow-up if no improvement in symptoms.

## 2022-12-28 NOTE — Assessment & Plan Note (Signed)
Chronic.  No surgical intervention at this time per vascular Continue use of compression stockings Elevate legs when able Continue to follow with vascular

## 2022-12-28 NOTE — Assessment & Plan Note (Signed)
Chronic.  Stable.  Recent EGD 06/23. Continue Protonix 40 mg twice daily Follow-up as needed

## 2022-12-29 ENCOUNTER — Other Ambulatory Visit: Payer: Self-pay | Admitting: Family Medicine

## 2022-12-29 DIAGNOSIS — E559 Vitamin D deficiency, unspecified: Secondary | ICD-10-CM

## 2022-12-29 MED ORDER — VITAMIN D (ERGOCALCIFEROL) 1.25 MG (50000 UNIT) PO CAPS: 50000.0000 [IU] | ORAL_CAPSULE | ORAL | 1 refills | Status: DC

## 2023-01-02 ENCOUNTER — Encounter: Payer: Self-pay | Admitting: *Deleted

## 2023-01-02 ENCOUNTER — Ambulatory Visit (INDEPENDENT_AMBULATORY_CARE_PROVIDER_SITE_OTHER): Payer: Medicare PPO | Admitting: *Deleted

## 2023-01-02 VITALS — BP 118/78 | Ht 61.0 in | Wt 178.2 lb

## 2023-01-02 DIAGNOSIS — Z0000 Encounter for general adult medical examination without abnormal findings: Secondary | ICD-10-CM | POA: Diagnosis not present

## 2023-01-02 NOTE — Progress Notes (Signed)
I connected with  Minda Ditto on 01/02/23 by a audio enabled telemedicine application and verified that I am speaking with the correct person using two identifiers.  Patient Location: Home  Provider Location: Office/Clinic  I discussed the limitations of evaluation and management by telemedicine. The patient expressed understanding and agreed to proceed.   Subjective:   TYLENE STAUNTON is a 77 y.o. female who presents for Medicare Annual (Subsequent) preventive examination.       Objective:    Today's Vitals   01/02/23 1438  BP: 118/78  Weight: 178 lb 3.2 oz (80.8 kg)  Height: '5\' 1"'$  (1.549 m)   Body mass index is 33.67 kg/m.     01/02/2023    3:03 PM 12/03/2021   12:46 PM 11/16/2021    9:53 AM 11/30/2020   12:47 PM 11/30/2019   12:45 PM 09/11/2019    6:02 AM 07/01/2019    6:26 PM  Advanced Directives  Does Patient Have a Medical Advance Directive? Yes Yes Yes Yes Yes No No  Type of Paramedic of [place];Living will  [place];Living will [place];Living will [place];Living will    Does patient want to make changes to medical advance directive? No - Patient declined No - Patient declined No - Patient declined No - Patient declined No - Patient declined    Copy of [place] in Chart? No - copy requested No - copy requested No - copy requested No - copy requested No - copy requested    Would patient like information on creating a medical advance directive?       No - Guardian declined    Current Medications (verified) Outpatient Encounter Medications as of 01/02/2023  Medication Sig   albuterol (VENTOLIN HFA) 108 (90 Base) MCG/ACT inhaler INHALE 1 TO 2 PUFFS INTO THE LUNGS EVERY 6 HOURS AS NEEDED FOR WHEEZING OR SHORTNESS OF BREATH OR COUGH   Ascorbic Acid (VITAMIN C) 500 MG tablet Take 500 mg by mouth daily.   aspirin EC 81 MG tablet Take 81 mg by mouth daily.    azelastine (ASTELIN) 0.1 % nasal spray Place 2 sprays into both nostrils as needed. Use in each nostril as directed   Calcium Carbonate-Vitamin D (CALCIUM 600/VITAMIN D PO) Take 1 tablet by mouth daily.   Diclofenac Sodium (VOLTAREN EX) Apply topically as needed.   ezetimibe (ZETIA) 10 MG tablet TAKE 1 TABLET(10 MG) BY MOUTH DAILY   fish oil-omega-3 fatty acids 1000 MG capsule Take 2 g by mouth daily.   fluticasone (FLONASE) 50 MCG/ACT nasal spray Place 2 sprays into both nostrils daily as needed for allergies or rhinitis.   metoprolol tartrate (LOPRESSOR) 25 MG tablet TAKE 1/2 TABLET BY MOUTH TWICE DAILY   pantoprazole (PROTONIX) 40 MG tablet Take 1 tablet (40 mg total) by mouth 2 (two) times daily before a meal. 30 min before food   simvastatin (ZOCOR) 40 MG tablet TAKE 1 TABLET(40 MG) BY MOUTH DAILY   Vitamin D, Ergocalciferol, (DRISDOL) 1.25 MG (50000 UNIT) CAPS capsule Take 1 capsule (50,000 Units total) by mouth every 7 (seven) days.   vitamin E 400 UNIT capsule Take 400 Units by mouth daily.   [DISCONTINUED] amitriptyline (ELAVIL) 10 MG tablet Take 1 tablet (10 mg total) by mouth at bedtime.   [DISCONTINUED] meclizine (ANTIVERT) 25 MG tablet Take 1 tablet (25 mg total) by mouth 3 (three) times daily as needed for dizziness.   [  DISCONTINUED] melatonin 5 MG TABS Take 5 mg by mouth at bedtime as needed. (Patient not taking: Reported on 01/02/2023)   [DISCONTINUED] ondansetron (ZOFRAN) 4 MG tablet Take 1 tablet (4 mg total) by mouth every 8 (eight) hours as needed.   [DISCONTINUED] saccharomyces boulardii (FLORASTOR) 250 MG capsule Take 1 capsule (250 mg total) by mouth daily.   No facility-administered encounter medications on file as of 01/02/2023.    Allergies (verified) Dexilant [dexlansoprazole] and Sulfonamide derivatives   History: Past Medical History:  Diagnosis Date   Abnormal EEG 03/16/2020   Abnormal EKG 08/19/2019   Anxiety    Aortic atherosclerosis ([place])    Basal cell  carcinoma 03/2019   right upper forehead, MOHS   Basal cell carcinoma 10/2012   Left upper back   Body aches 09/28/2022   Bronchitis due to COVID-19 virus 04/11/2021   COVID-19    04/11/21 tx'ed paxlovid on call Dr. Scarlette Calico   Disorder of bone and cartilage 11/30/2008   Qualifier: Diagnosis of   By: Maxie Better FNP, Rosalita Levan      Diverticulosis    Dizziness 08/19/2019   Exertional dyspnea 07/15/2019   Fatty liver 2010   GERD (gastroesophageal reflux disease)    H. pylori infection    Hemangioma 2010   Hematuria 02/28/2022   Hiatal hernia    Hyperlipidemia    Hypertension    Insomnia 03/16/2020   Liver hemangioma    Lymphedema 09/10/2019   L>R   Pure hypercholesterolemia 04/01/2020   Sinus tachycardia 09/10/2019   Splenic artery aneurysm ([place])    Syncope 07/15/2019   Tubular adenoma of colon    Past Surgical History:  Procedure Laterality Date   bilateral lens replacement     CHOLECYSTECTOMY     COLONOSCOPY  2012   KNEE SURGERY  06/29/2007   Meniscus tear, left knee surgery   UPPER GASTROINTESTINAL ENDOSCOPY     Family History  Problem Relation Age of Onset   Hypertension Sister    Skin cancer Sister    Hypertension Sister    Heart attack Sister        26 y.o as of 09/10/19    Anxiety disorder Sister    Hypertension Brother    Colon cancer Neg Hx    Colon polyps Neg Hx    Esophageal cancer Neg Hx    Rectal cancer Neg Hx    Stomach cancer Neg Hx    Social History   Socioeconomic History   Marital status: Widowed    Spouse name: Not on file   Number of children: 2   Years of education: Not on file   Highest education level: Not on file  Occupational History   Occupation: Retired Garment/textile technologist  Tobacco Use   Smoking status: Never   Smokeless tobacco: Never  Vaping Use   Vaping Use: Never used  Substance and Sexual Activity   Alcohol use: No   Drug use: No   Sexual activity: Not Currently    Birth control/protection: None   Other Topics Concern   Not on file  Social History Narrative   Has living will.  Widow as of 04/08/18.  Daughters have a copy of living will and [name] would be HPOA if husband was not able.   1 daughter lives in [place] has 4 kids and 1 daughter in [place] with 2 kids      Would desire CPR.  She would not want heroic measures.  Social Determinants of Health   Financial Resource Strain: Low Risk  (12/03/2021)   Overall Financial Resource Strain (CARDIA)    Difficulty of Paying Living Expenses: Not hard at all  Food Insecurity: No Food Insecurity (01/02/2023)   Hunger Vital Sign    Worried About Running Out of Food in the Last Year: Never true    Ran Out of Food in the Last Year: Never true  Transportation Needs: No Transportation Needs (01/02/2023)   PRAPARE - Hydrologist (Medical): No    Lack of Transportation (Non-Medical): No  Physical Activity: Sufficiently Active (12/03/2021)   Exercise Vital Sign    Days of Exercise per Week: 4 days    Minutes of Exercise per Session: 40 min  Stress: No Stress Concern Present (12/03/2021)   [place]    Feeling of Stress : Not at all  Social Connections: Moderately Integrated (01/02/2023)   Social Connection and Isolation Panel [NHANES]    Frequency of Communication with Friends and Family: More than three times a week    Frequency of Social Gatherings with Friends and Family: More than three times a week    Attends Religious Services: More than 4 times per year    Active Member of [employer] or Organizations: Yes    Attends Archivist Meetings: More than 4 times per year    Marital Status: Widowed    Tobacco Counseling Counseling given: Not Answered   Clinical Intake:  Pre-visit preparation completed: Yes  Pain : No/denies pain     BMI - recorded: 33.69 Nutritional Risks: None Diabetes: No  How often do you  need to have someone help you when you read instructions, pamphlets, or other written materials from your doctor or pharmacy?: 1 - Never  Diabetic? No         Activities of Daily Living    01/02/2023    2:58 PM  In your present state of health, do you have any difficulty performing the following activities:  Hearing? 0  Vision? 0  Difficulty concentrating or making decisions? 0  Walking or climbing stairs? 0  Dressing or bathing? 0  Doing errands, shopping? 0  Preparing Food and eating ? N  Using the Toilet? N  In the past six months, have you accidently leaked urine? N  Do you have problems with loss of bowel control? N  Managing your Medications? N  Managing your Finances? N  Housekeeping or managing your Housekeeping? N    Patient Care Team: Carollee Leitz, MD as PCP - General (Family Medicine) Johnney Ou., MD as Consulting Physician (Internal Medicine) Inda Castle, MD (Inactive) as Consulting Physician (Gastroenterology)  Indicate any recent Medical Services you may have received from other than [facility] providers in the past year (date may be approximate).     Assessment:   This is a routine wellness examination for [place].  Hearing/Vision screen No results found.  Dietary issues and exercise activities discussed: Current Exercise Habits: The patient does not participate in regular exercise at present   Goals Addressed             This Visit's Progress     Acknowledge receipt of Advanced Directive package       Will work on bringing [place] & living Will to the office       Depression Screen    01/02/2023    2:58 PM 12/26/2022    8:20 AM  06/26/2022   11:27 AM 02/26/2022    9:03 AM 12/03/2021   12:45 PM 11/30/2020   12:44 PM 08/04/2020   10:05 AM  PHQ 2/9 Scores  PHQ - 2 Score 0 0 0 0 0 0 0    Fall Risk    01/02/2023    2:57 PM 12/26/2022    8:20 AM 06/26/2022   11:27 AM 02/26/2022    9:03 AM 12/03/2021   12:48 PM  [place] in the past  year? 0 0 0 0 0  Number falls in past yr: 0 0 0 0 0  Injury with Fall? 0 0 0 0   Risk for fall due to : No Fall Risks No Fall Risks No Fall Risks No Fall Risks   Follow up  Falls evaluation completed Falls evaluation completed Falls evaluation completed Falls evaluation completed    [place]:  Any stairs in or around the home? No  If so, are there any without handrails? No  Home free of loose throw rugs in walkways, pet beds, electrical cords, etc? Yes  Adequate lighting in your home to reduce risk of falls? Yes   ASSISTIVE DEVICES UTILIZED TO PREVENT FALLS:  Life alert? No  Use of a cane, walker or w/c? No  Grab bars in the bathroom? No  Shower chair or bench in shower? Yes  Elevated toilet seat or a handicapped toilet? No    Cognitive Function:    07/29/2017   11:42 AM 07/22/2016    8:50 AM  MMSE - Mini Mental State Exam  Orientation to time 5 5  Orientation to Place 5 5  Registration 3 3  Attention/ Calculation 0 0  Recall 3 3  Language- name 2 objects 0 0  Language- repeat 1 1  Language- follow 3 step command 3 3  Language- read & follow direction 0 0  Write a sentence 0 0  Copy design 0 0  Total score 20 20        01/02/2023    3:02 PM 11/30/2020   12:54 PM 11/30/2019   12:52 PM  6CIT Screen  What Year? 0 points 0 points 0 points  What month? 0 points 0 points 0 points  What time? 0 points 0 points 0 points  Count back from 20 2 points 0 points   Months in reverse 0 points 0 points   Repeat phrase 0 points    Total Score 2 points      Immunizations Immunization History  Administered Date(s) Administered   Fluad Quad(high Dose 65+) 08/16/2019   Influenza Split 07/10/2011   Influenza Whole 07/27/2010, 07/18/2012   Influenza, High Dose Seasonal PF 07/21/2020   Influenza,inj,Quad PF,6+ Mos 07/14/2014   Influenza,inj,quad, With Preservative 08/07/2021   Influenza-Unspecified 06/28/2013, 06/19/2015   PFIZER(Purple  Top)SARS-COV-2 Vaccination 12/15/2019, 01/05/2020, 09/27/2020   Pneumococcal Conjugate-13 04/05/2014   Pneumococcal Polysaccharide-23 01/13/2013   Td 11/30/2008   Tdap 03/22/2020   Zoster Recombinat (Shingrix) 01/29/2018, 05/06/2018   Zoster, Live 11/30/2008    TDAP status: Up to date  Flu Vaccine status: Declined, Education has been provided regarding the importance of this vaccine but patient still declined. Advised may receive this vaccine at local pharmacy or Health Dept. Aware to provide a copy of the vaccination record if obtained from local pharmacy or Health Dept. Verbalized acceptance and understanding.  Pneumococcal vaccine status: Up to date  Covid-19 vaccine status: Completed vaccines  Qualifies for Shingles Vaccine?  Yes   Zostavax completed No   Shingrix Completed?: Yes  Screening Tests Health Maintenance  Topic Date Due   INFLUENZA VACCINE  01/26/2023 (Originally 05/28/2022)   MAMMOGRAM  08/25/2023   Medicare Annual Wellness (AWV)  01/02/2024   DTaP/Tdap/Td (3 - Td or Tdap) 03/22/2030   Pneumonia Vaccine 11+ Years old  Completed   DEXA SCAN  Completed   Hepatitis C Screening  Completed   Zoster Vaccines- Shingrix  Completed   HPV VACCINES  Aged Out   COLONOSCOPY (Pts 45-80yr Insurance coverage will need to be confirmed)  Discontinued   COVID-19 Vaccine  Discontinued    Health Maintenance  There are no preventive care reminders to display for this patient.   Colorectal cancer screening: Type of screening: Colonoscopy. Completed 11/16/2021. Repeat every 3 years  Mammogram status: Completed 08/24/2021. Repeat every year  Bone Density status: Completed 08/15/2021. Results reflect: Bone density results: OSTEOPENIA. Repeat every 2 years.  Lung Cancer Screening: (Low Dose CT Chest recommended if Age 79-80years, 30 pack-year currently smoking OR have quit w/in 15years.) does not qualify.    Additional Screening:  Hepatitis C Screening: does qualify; Completed  07/22/2016  Vision Screening: Recommended annual ophthalmology exams for early detection of glaucoma and other disorders of the eye. Is the patient up to date with their annual eye exam?  Yes  Who is the provider or what is the name of the office in which the patient attends annual eye exams? [facility] If pt is not established with a provider, would they like to be referred to a provider to establish care? No .   Dental Screening: Recommended annual dental exams for proper oral hygiene  Community Resource Referral / Chronic Care Management: CRR required this visit?  No   CCM required this visit?  No      Plan:     I have personally reviewed and noted the following in the patient's chart:   Medical and social history Use of alcohol, tobacco or illicit drugs  Current medications and supplements including opioid prescriptions. Patient is not currently taking opioid prescriptions. Functional ability and status Nutritional status Physical activity Advanced directives List of other physicians Hospitalizations, surgeries, and ER visits in previous 12 months Vitals Screenings to include cognitive, depression, and falls Referrals and appointments  In addition, I have reviewed and discussed with patient certain preventive protocols, quality metrics, and best practice recommendations. A written personalized care plan for preventive services as well as general preventive health recommendations were provided to patient.     LCannon Kettle [place]  01/02/2023   Nurse Notes: Total time spent on telephone visit today was 30 minutes

## 2023-01-02 NOTE — Patient Instructions (Signed)
  Leah Melendez , Thank you for taking time to come for your Medicare Wellness Visit. I appreciate your ongoing commitment to your health goals. Please review the following plan we discussed and let me know if I can assist you in the future.   These are the goals we discussed:  Goals       Acknowledge receipt of Advanced Directive package     Will work on bringing HCPOA & living Will to the office     Follow up with [place] Provider     As needed.     Increase physical activity     Stretch  Stay active        This is a list of the screening recommended for you and due dates:  Health Maintenance  Topic Date Due   Flu Shot  01/26/2023*   Mammogram  08/25/2023   Medicare Annual Wellness Visit  01/02/2024   DTaP/Tdap/Td vaccine (3 - Td or Tdap) 03/22/2030   Pneumonia Vaccine  Completed   DEXA scan (bone density measurement)  Completed   Hepatitis C Screening: USPSTF Recommendation to screen - Ages 2-79 yo.  Completed   Zoster (Shingles) Vaccine  Completed   HPV Vaccine  Aged Out   Colon Cancer Screening  Discontinued   COVID-19 Vaccine  Discontinued  *Topic was postponed. The date shown is not the original due date.

## 2023-01-14 ENCOUNTER — Ambulatory Visit: Payer: Medicare PPO | Admitting: Dermatology

## 2023-01-14 VITALS — BP 116/72 | HR 79

## 2023-01-14 DIAGNOSIS — D225 Melanocytic nevi of trunk: Secondary | ICD-10-CM | POA: Diagnosis not present

## 2023-01-14 DIAGNOSIS — Z1283 Encounter for screening for malignant neoplasm of skin: Secondary | ICD-10-CM

## 2023-01-14 DIAGNOSIS — D239 Other benign neoplasm of skin, unspecified: Secondary | ICD-10-CM

## 2023-01-14 DIAGNOSIS — D2271 Melanocytic nevi of right lower limb, including hip: Secondary | ICD-10-CM

## 2023-01-14 DIAGNOSIS — L814 Other melanin hyperpigmentation: Secondary | ICD-10-CM

## 2023-01-14 DIAGNOSIS — L821 Other seborrheic keratosis: Secondary | ICD-10-CM

## 2023-01-14 DIAGNOSIS — D1801 Hemangioma of skin and subcutaneous tissue: Secondary | ICD-10-CM

## 2023-01-14 DIAGNOSIS — L719 Rosacea, unspecified: Secondary | ICD-10-CM

## 2023-01-14 DIAGNOSIS — D229 Melanocytic nevi, unspecified: Secondary | ICD-10-CM

## 2023-01-14 DIAGNOSIS — Z85828 Personal history of other malignant neoplasm of skin: Secondary | ICD-10-CM

## 2023-01-14 DIAGNOSIS — D2371 Other benign neoplasm of skin of right lower limb, including hip: Secondary | ICD-10-CM

## 2023-01-14 NOTE — Progress Notes (Deleted)
   Follow-Up Visit   Subjective  Leah Melendez is a 77 y.o. female who presents for the following: Annual Exam. Yearly mole check hx of BCC. The patient presents for Total-Body Skin Exam (TBSE) for skin cancer screening and mole check.  The patient has spots, moles and lesions to be evaluated, some may be new or changing and the patient has concerns that these could be cancer.    The following portions of the chart were reviewed this encounter and updated as appropriate:      Review of Systems:  No other skin or systemic complaints except as noted in HPI or Assessment and Plan.  Objective  Well appearing patient in no apparent distress; mood and affect are within normal limits.  A full examination was performed including scalp, head, eyes, ears, nose, lips, neck, chest, axillae, abdomen, back, buttocks, bilateral upper extremities, bilateral lower extremities, hands, feet, fingers, toes, fingernails, and toenails. All findings within normal limits unless otherwise noted below.   Assessment & Plan   No follow-ups on file.  I, Marye Round, CMA, am acting as scribe for Brendolyn Patty, MD .

## 2023-01-14 NOTE — Patient Instructions (Addendum)
Counseling for BBL / IPL / Laser and Coordination of Care Discussed the treatment option of Broad Band Light (BBL) /Intense Pulsed Light (IPL)/ Laser for skin discoloration, including brown spots and redness.  Typically we recommend at least 1-3 treatment sessions about 5-8 weeks apart for best results.  Cannot have tanned skin when BBL performed, and regular use of sunscreen is advised after the procedure to help maintain results. The patient's condition may also require "maintenance treatments" in the future.  The fee for BBL / laser treatments is $350 per treatment session for the whole face.  A fee can be quoted for other parts of the body.  Insurance typically does not pay for BBL/laser treatments and therefore the fee is an out-of-pocket cost.      Recommend daily broad spectrum sunscreen SPF 30+ to sun-exposed areas, reapply every 2 hours as needed. Call for new or changing lesions.  Staying in the shade or wearing long sleeves, sun glasses (UVA+UVB protection) and wide brim hats (4-inch brim around the entire circumference of the hat) are also recommended for sun protection.        Due to recent changes in healthcare laws, you may see results of your pathology and/or laboratory studies on MyChart before the doctors have had a chance to review them. We understand that in some cases there may be results that are confusing or concerning to you. Please understand that not all results are received at the same time and often the doctors may need to interpret multiple results in order to provide you with the best plan of care or course of treatment. Therefore, we ask that you please give Leah Melendez 2 business days to thoroughly review all your results before contacting the office for clarification. Should we see a critical lab result, you will be contacted sooner.   If You Need Anything After Your Visit  If you have any questions or concerns for your doctor, please call our main line at [phone] and  press option 4 to reach your doctor's medical assistant. If no one answers, please leave a voicemail as directed and we will return your call as soon as possible. Messages left after 4 pm will be answered the following business day.   You may also send Leah Melendez a message via [place]. We typically respond to MyChart messages within 1-2 business days.  For prescription refills, please ask your pharmacy to contact our office. Our fax number is [phone].  If you have an urgent issue when the clinic is closed that cannot wait until the next business day, you can page your doctor at the number below.    Please note that while we do our best to be available for urgent issues outside of office hours, we are not available 24/7.   If you have an urgent issue and are unable to reach Leah Melendez, you may choose to seek medical care at your doctor's office, retail clinic, urgent care center, or emergency room.  If you have a medical emergency, please immediately call 911 or go to the emergency department.  Pager Numbers  - Dr. Nehemiah Massed: [phone]  - Dr. Laurence Ferrari: [phone]  - Dr. Nicole Kindred: [phone]  In the event of inclement weather, please call our main line at [phone] for an update on the status of any delays or closures.  Dermatology Medication Tips: Please keep the boxes that topical medications come in in order to help keep track of the instructions about where and how to use these. Pharmacies typically print  the medication instructions only on the boxes and not directly on the medication tubes.   If your medication is too expensive, please contact our office at [phone] option 4 or send Leah Melendez a message through [place].   We are unable to tell what your co-pay for medications will be in advance as this is different depending on your insurance coverage. However, we may be able to find a substitute medication at lower cost or fill out paperwork to get insurance to cover a needed medication.   If  a prior authorization is required to get your medication covered by your insurance company, please allow Leah Melendez 1-2 business days to complete this process.  Drug prices often vary depending on where the prescription is filled and some pharmacies may offer cheaper prices.  The website www.goodrx.com contains coupons for medications through different pharmacies. The prices here do not account for what the cost may be with help from insurance (it may be cheaper with your insurance), but the website can give you the price if you did not use any insurance.  - You can print the associated coupon and take it with your prescription to the pharmacy.  - You may also stop by our office during regular business hours and pick up a GoodRx coupon card.  - If you need your prescription sent electronically to a different pharmacy, notify our office through [facility] or by phone at [phone] option 4.     Si Usted Necesita Algo Despus de Su Visita  Tambin puede enviarnos un mensaje a travs de Pharmacist, community. Por lo general respondemos a los mensajes de MyChart en el transcurso de 1 a 2 das hbiles.  Para renovar recetas, por favor pida a su farmacia que se ponga en contacto con nuestra oficina. Harland Dingwall de fax es [place] [phone].  Si tiene un asunto urgente cuando la clnica est cerrada y que no puede esperar hasta el siguiente da hbil, puede llamar/localizar a su doctor(a) al nmero que aparece a continuacin.   Por favor, tenga en cuenta que aunque hacemos todo lo posible para estar disponibles para asuntos urgentes fuera del horario de [place], no estamos disponibles las 24 horas del da, los 7 das de la [place].   Si tiene un problema urgente y no puede comunicarse con nosotros, puede optar por buscar atencin mdica  en el consultorio de su doctor(a), en una clnica privada, en un centro de atencin urgente o en una sala de emergencias.  Si tiene Engineering geologist, por favor llame  inmediatamente al 911 o vaya a la sala de emergencias.  Nmeros de bper  - Dr. Nehemiah Massed: [phone]  - Dra. Moye: [phone]  - Dra. Nicole Kindred: [phone]  En caso de inclemencias del [place], por favor llame a Johnsie Kindred principal al [phone] para una actualizacin sobre el [place] de cualquier retraso o cierre.  Consejos para la medicacin en dermatologa: Por favor, guarde las cajas en las que vienen los medicamentos de uso tpico para ayudarle a seguir las instrucciones sobre dnde y cmo usarlos. Las farmacias generalmente imprimen las instrucciones del medicamento slo en las cajas y no directamente en los tubos del [place].   Si su medicamento es muy caro, por favor, pngase en contacto con Zigmund Daniel llamando al [phone] y presione la opcin 4 o envenos un mensaje a travs de Pharmacist, community.   No podemos decirle cul ser su copago por los medicamentos por adelantado ya que esto es diferente dependiendo de la cobertura de su seguro.  Sin embargo, es posible que podamos encontrar un medicamento sustituto a Electrical engineer un formulario para que el seguro cubra el medicamento que se considera necesario.   Si se requiere una autorizacin previa para que su compaa de seguros [place] su medicamento, por favor permtanos de 1 a 2 das hbiles para completar este proceso.  Los precios de los medicamentos varan con frecuencia dependiendo del Environmental consultant de dnde se surte la receta y alguna farmacias pueden ofrecer precios ms baratos.  El sitio web www.goodrx.com tiene cupones para medicamentos de Airline pilot. Los precios aqu no tienen en cuenta lo que podra costar con la ayuda del seguro (puede ser ms barato con su seguro), pero el sitio web puede darle el precio si no utiliz Research scientist (physical sciences).  - Puede imprimir el cupn correspondiente y llevarlo con su receta a la farmacia.  - Tambin puede pasar por nuestra oficina durante el horario de atencin regular y Charity fundraiser  una tarjeta de cupones de GoodRx.  - Si necesita que su receta se enve electrnicamente a una farmacia diferente, informe a nuestra oficina a travs de MyChart de [place] o por telfono llamando al [phone] y presione la opcin 4.

## 2023-01-14 NOTE — Progress Notes (Signed)
Follow-Up Visit   Subjective  Leah Melendez is a 77 y.o. female who presents for the following: Annual Exam. Yearly mole check hx of BCC. The patient presents for Total-Body Skin Exam (TBSE) for skin cancer screening and mole check.  The patient has spots, moles and lesions to be evaluated, some may be new or changing and the patient has concerns that these could be cancer.    The following portions of the chart were reviewed this encounter and updated as appropriate:       Review of Systems:  No other skin or systemic complaints except as noted in HPI or Assessment and Plan.  Objective  Well appearing patient in no apparent distress; mood and affect are within normal limits.  A full examination was performed including scalp, head, eyes, ears, nose, lips, neck, chest, axillae, abdomen, back, buttocks, bilateral upper extremities, bilateral lower extremities, hands, feet, fingers, toes, fingernails, and toenails. All findings within normal limits unless otherwise noted below.   nasal tip Mild erythema nasal tip   Left Abdomen, Right Upper Back, Right medial upper thigh left abdomen 9 x 5 mm brown papule lighter edge    Right Upper Back 5 x 3 medium brown speckled thin papule   Right medial upper thigh  2.5 mm medium dark brown macule       Assessment & Plan  Rosacea nasal tip  Mild, no treatment at this time  Rosacea is a chronic progressive skin condition usually affecting the face of adults, causing redness and/or acne bumps. It is treatable but not curable. It sometimes affects the eyes (ocular rosacea) as well. It may respond to topical and/or systemic medication and can flare with stress, sun exposure, alcohol, exercise, topical steroids (including hydrocortisone/cortisone 10) and some foods.  Daily application of broad spectrum spf 30+ sunscreen to face is recommended to reduce flares.   Nevus (3) Right medial upper thigh; Left Abdomen; Right Upper  Back  Benign-appearing.  Stable. Observation.  Call clinic for new or changing lesions.  Recommend daily use of broad spectrum spf 30+ sunscreen to sun-exposed areas.      Melanocytic Nevi - Tan-brown and/or pink-flesh-colored symmetric macules and papules - Benign appearing on exam today - Observation - Call clinic for new or changing moles - Recommend daily use of broad spectrum spf 30+ sunscreen to sun-exposed areas.    Lentigines - Scattered tan macules - Due to sun exposure - Benign-appearing, observe - Recommend daily broad spectrum sunscreen SPF 30+ to sun-exposed areas, reapply every 2 hours as needed. -Discussed cosmetic BBL procedure  Counseling for BBL / IPL / Laser and Coordination of Care Discussed the treatment option of Broad Band Light (BBL) /Intense Pulsed Light (IPL)/ Laser for skin discoloration, including brown spots and redness.  Typically we recommend at least 1-3 treatment sessions about 5-8 weeks apart for best results.  Cannot have tanned skin when BBL performed, and regular use of sunscreen is advised after the procedure to help maintain results. The patient's condition may also require "maintenance treatments" in the future.  The fee for BBL / laser treatments is $350 per treatment session for the whole face.  A fee can be quoted for other parts of the body.  Insurance typically does not pay for BBL/laser treatments and therefore the fee is an out-of-pocket cost.  - Call for any changes   Seborrheic Keratoses - Stuck-on, waxy, tan-brown papules and/or plaques  - Benign-appearing - Discussed benign etiology and prognosis. - Observe -  Call for any changes  Hemangiomas - Red papules - Discussed benign nature - Observe - Call for any changes   Dermatofibroma Right anterior ankle  - Firm pink/brown papulenodule with dimple sign - Benign appearing - Call for any changes   History of Basal Cell Carcinoma of the Skin - No evidence of recurrence today -  Recommend regular full body skin exams - Recommend daily broad spectrum sunscreen SPF 30+ to sun-exposed areas, reapply every 2 hours as needed.  - Call if any new or changing lesions are noted between office visits   Return in about 1 year (around 01/14/2024) for TBSE, hx of BCC.  I, Marye Round, CMA, am acting as scribe for Brendolyn Patty, MD .   Documentation: I have reviewed the above documentation for accuracy and completeness, and I agree with the above.  Brendolyn Patty MD

## 2023-01-28 ENCOUNTER — Encounter: Payer: Self-pay | Admitting: Family Medicine

## 2023-01-28 ENCOUNTER — Ambulatory Visit (INDEPENDENT_AMBULATORY_CARE_PROVIDER_SITE_OTHER): Payer: Medicare PPO | Admitting: Family Medicine

## 2023-01-28 VITALS — BP 120/70 | HR 66 | Temp 98.7°F | Ht 61.0 in | Wt 180.0 lb

## 2023-01-28 DIAGNOSIS — K219 Gastro-esophageal reflux disease without esophagitis: Secondary | ICD-10-CM

## 2023-01-28 DIAGNOSIS — E782 Mixed hyperlipidemia: Secondary | ICD-10-CM | POA: Diagnosis not present

## 2023-01-28 DIAGNOSIS — Z0000 Encounter for general adult medical examination without abnormal findings: Secondary | ICD-10-CM | POA: Diagnosis not present

## 2023-01-28 DIAGNOSIS — J309 Allergic rhinitis, unspecified: Secondary | ICD-10-CM

## 2023-01-28 DIAGNOSIS — Z1231 Encounter for screening mammogram for malignant neoplasm of breast: Secondary | ICD-10-CM

## 2023-01-28 DIAGNOSIS — I10 Essential (primary) hypertension: Secondary | ICD-10-CM

## 2023-01-28 DIAGNOSIS — E559 Vitamin D deficiency, unspecified: Secondary | ICD-10-CM

## 2023-01-28 DIAGNOSIS — E785 Hyperlipidemia, unspecified: Secondary | ICD-10-CM

## 2023-01-28 MED ORDER — AZELASTINE HCL 0.1 % NA SOLN: 2.0000 | NASAL | 3 refills | Status: AC | PRN

## 2023-01-28 MED ORDER — EZETIMIBE 10 MG PO TABS: ORAL_TABLET | ORAL | 3 refills | Status: DC

## 2023-01-28 MED ORDER — SIMVASTATIN 40 MG PO TABS: ORAL_TABLET | ORAL | 3 refills | Status: DC

## 2023-01-28 MED ORDER — FAMOTIDINE 20 MG PO TABS: 20.0000 mg | ORAL_TABLET | Freq: Two times a day (BID) | ORAL | 1 refills | Status: DC

## 2023-01-28 MED ORDER — FLUTICASONE PROPIONATE 50 MCG/ACT NA SUSP: 2.0000 | Freq: Every day | NASAL | 3 refills | Status: AC | PRN

## 2023-01-28 NOTE — Patient Instructions (Addendum)
It was a pleasure meeting you today. Thank you for allowing me to take part in your health care.  Our goals for today as we discussed include:  Restart Astelin nasal spray at night Continue Flonase spray in morning Continue Allegra 180 mg daily  Start Pepcid 20 mg two times a day Decrease Protonix to 40 mg at night  Follow up in 4 months  If you have any questions or concerns, please do not hesitate to call the office at [phone].  I look forward to our next visit and until then take care and stay safe.  Regards,   Carollee Leitz, MD   [facility]

## 2023-01-28 NOTE — Progress Notes (Signed)
SUBJECTIVE:   Chief Complaint  Patient presents with   Annual Exam   Ear Fullness   HPI Presents to clinic for annual physical  No acute concerns today  Follows with OBGYN for annual pelvic exam and mammogram  Lives alone since husband passed.   Remains active with family.  Spent Easter with children and grandkids and enjoyed time spent. Spends time gardening  Attends social events and church events.  GERD Has been on long-term use of Protonix 40 mg twice daily.  Willing to trial slow taper wean.  EGD up-to-date no significant esophagitis present or Barrett's esophagus.  Hypertension Asymptomatic.  Well-controlled on metoprolol 12.5 mg twice daily.  Denies any weakness, dizziness, headaches or visual changes.  No chest pain, shortness of breath or lower extremity edema.  Hyperlipidemia Currently on Zocor 40 mg and Zetia 10 mg daily.  Tolerating medications well.  Denies any myalgias.  Allergic rhinitis Requesting refills for Astelin and Flonase.  Has recently started Allegra 180 mg daily and reports has helped.   Review of Systems - History obtained from the patient General ROS: negative for - fatigue, sleep disturbance, weight gain, or weight loss Psychological ROS: negative for - anxiety, depression, or sleep disturbances Ophthalmic ROS: negative for - blurry vision or double vision ENT ROS: positive for - nasal congestion and nasal discharge negative for - headaches, hearing change, or tinnitus Allergy and Immunology ROS: positive for - nasal congestion and seasonal allergies Hematological and Lymphatic ROS: negative for - bleeding problems, bruising, or weight loss Endocrine ROS: negative Respiratory ROS: negative for - cough, shortness of breath, or wheezing Cardiovascular ROS: negative for - chest pain, dyspnea on exertion, edema, irregular heartbeat, palpitations, or shortness of breath Gastrointestinal ROS: positive for - heartburn negative for - appetite loss,  blood in stools, constipation, diarrhea, nausea/vomiting, or swallowing difficulty/pain Genito-Urinary ROS: negative for - hematuria Musculoskeletal ROS: positive for - joint stiffness negative for - gait disturbance, joint pain, joint swelling, muscle pain, or muscular weakness Neurological ROS: negative for - dizziness, headaches, or memory loss   PERTINENT PMH / PSH: HTN CAD Aortic Atherosclerosis Neuropathy OSA Splenic Aneurysm  OBJECTIVE:  BP 120/70   Pulse 66   Temp 98.7 F (37.1 C) (Oral)   Ht 5\' 1"  (1.549 m)   Wt 180 lb (81.6 kg)   SpO2 94%   BMI 34.01 kg/m    Physical Exam Vitals reviewed.  Constitutional:      General: She is not in acute distress.    Appearance: She is not ill-appearing.  HENT:     Head: Normocephalic.     Right Ear: Tympanic membrane, ear canal and external ear normal.     Left Ear: Tympanic membrane, ear canal and external ear normal.     Nose: Nose normal.     Mouth/Throat:     Mouth: Mucous membranes are moist.  Eyes:     Extraocular Movements: Extraocular movements intact.     Conjunctiva/sclera: Conjunctivae normal.     Pupils: Pupils are equal, round, and reactive to light.  Neck:     Vascular: No carotid bruit.  Cardiovascular:     Rate and Rhythm: Normal rate and regular rhythm.     Pulses: Normal pulses.     Heart sounds: Normal heart sounds.  Pulmonary:     Effort: Pulmonary effort is normal.     Breath sounds: Normal breath sounds.  Abdominal:     General: Bowel sounds are normal. There is no distension.  Palpations: Abdomen is soft.     Tenderness: There is no abdominal tenderness. There is no right CVA tenderness, left CVA tenderness, guarding or rebound.  Musculoskeletal:        General: Normal range of motion.     Cervical back: Normal range of motion.     Right lower leg: No edema.     Left lower leg: No edema.  Lymphadenopathy:     Cervical: No cervical adenopathy.  Skin:    Capillary Refill: Capillary  refill takes less than 2 seconds.  Neurological:     General: No focal deficit present.     Mental Status: She is alert and oriented to person, place, and time. Mental status is at baseline.     Motor: No weakness.  Psychiatric:        Mood and Affect: Mood normal.        Behavior: Behavior normal.        Thought Content: Thought content normal.        Judgment: Judgment normal.     ASSESSMENT/PLAN:  Annual physical exam Assessment & Plan: HCM Flu vaccine up-to-date Tdap up-to-date Pneumonia up-to-date Shingles up-to-date Colonoscopy 10/2021.  No recommendations for further colon cancer screening. Mammogram referral sent. DEXA up-to-date, 07/2021.  T-score -1.8, osteopenia Medicare annual wellness due 12/2023   Allergic rhinitis, unspecified seasonality, unspecified trigger Assessment & Plan: Refill Astelin 2 sprays to each nose at night Continue Allegra 180 mg daily Nettie pot as needed Refill Flonase 2 sprays daily  Orders: -     Azelastine HCl; Place 2 sprays into both nostrils as needed. Use in each nostril as directed  Dispense: 30 mL; Refill: 3 -     Fluticasone Propionate; Place 2 sprays into both nostrils daily as needed for allergies or rhinitis.  Dispense: 15.8 mL; Refill: 3  Gastroesophageal reflux disease, unspecified whether esophagitis present Assessment & Plan: Chronic.  Long-term use of Protonix 40 mg twice daily Recent EGD no esophagitis Avoid rebound effect by slow taper of  Protonix to 40 mg daily, plan to wean off given increased risk of side effects.   Start Pepcid 20 mg twice daily Follow-up in 4 months.   Orders: -     Famotidine; Take 1 tablet (20 mg total) by mouth 2 (two) times daily.  Dispense: 120 tablet; Refill: 1  HYPERLIPIDEMIA, MIXED Assessment & Plan: Chronic.  Stable.  Recent LDL at goal. Refill Zocor 40 mg daily Refill Zetia 10 mg daily   Orders: -     Simvastatin; TAKE 1 TABLET(40 MG) BY MOUTH DAILY  Dispense: 90 tablet;  Refill: 3 -     Ezetimibe; TAKE 1 TABLET(10 MG) BY MOUTH DAILY  Dispense: 90 tablet; Refill: 3  Vitamin D deficiency Assessment & Plan: Vitamin D level low Start vitamin D 1.25 mg weekly Repeat levels in 3 to 6 months.   Essential hypertension Assessment & Plan: Chronic.  Well-controlled current medication. Continue metoprolol 12.5 mg twice daily Monitor blood pressure at home goal less than 140/80 per JNC 8 guideline for age.    Breast cancer screening by mammogram -     3D Screening Mammogram, Left and Right; Future    PDMP reviewed  Return in about 4 months (around 05/30/2023).  Dana Allan, MD

## 2023-02-02 ENCOUNTER — Encounter: Payer: Self-pay | Admitting: Family Medicine

## 2023-02-02 DIAGNOSIS — Z1231 Encounter for screening mammogram for malignant neoplasm of breast: Secondary | ICD-10-CM | POA: Insufficient documentation

## 2023-02-02 DIAGNOSIS — J309 Allergic rhinitis, unspecified: Secondary | ICD-10-CM | POA: Insufficient documentation

## 2023-02-02 DIAGNOSIS — Z79899 Other long term (current) drug therapy: Secondary | ICD-10-CM | POA: Insufficient documentation

## 2023-02-02 DIAGNOSIS — Z0000 Encounter for general adult medical examination without abnormal findings: Secondary | ICD-10-CM | POA: Insufficient documentation

## 2023-02-02 NOTE — Assessment & Plan Note (Signed)
Chronic.  Stable.  Recent LDL at goal. Refill Zocor 40 mg daily Refill Zetia 10 mg daily

## 2023-02-02 NOTE — Assessment & Plan Note (Addendum)
Refill Astelin 2 sprays to each nose at night Continue Allegra 180 mg daily Nettie pot as needed Refill Flonase 2 sprays daily

## 2023-02-02 NOTE — Assessment & Plan Note (Signed)
Chronic.  Well-controlled current medication. Continue metoprolol 12.5 mg twice daily Monitor blood pressure at home goal less than 140/80 per JNC 8 guideline for age.

## 2023-02-02 NOTE — Assessment & Plan Note (Signed)
HCM Flu vaccine up-to-date Tdap up-to-date Pneumonia up-to-date Shingles up-to-date Colonoscopy 10/2021.  No recommendations for further colon cancer screening. Mammogram referral sent. DEXA up-to-date, 07/2021.  T-score -1.8, osteopenia Medicare annual wellness due 12/2023

## 2023-02-02 NOTE — Assessment & Plan Note (Signed)
Chronic.  Long-term use of Protonix 40 mg twice daily Recent EGD no esophagitis Avoid rebound effect by slow taper of  Protonix to 40 mg daily, plan to wean off given increased risk of side effects.   Start Pepcid 20 mg twice daily Follow-up in 4 months.

## 2023-02-02 NOTE — Assessment & Plan Note (Signed)
Vitamin D level low Start vitamin D 1.25 mg weekly Repeat levels in 3 to 6 months.

## 2023-02-28 ENCOUNTER — Telehealth: Payer: Self-pay | Admitting: Family Medicine

## 2023-02-28 DIAGNOSIS — E782 Mixed hyperlipidemia: Secondary | ICD-10-CM

## 2023-02-28 MED ORDER — EZETIMIBE 10 MG PO TABS: ORAL_TABLET | ORAL | 3 refills | Status: DC

## 2023-02-28 NOTE — Telephone Encounter (Signed)
Refill sent.

## 2023-02-28 NOTE — Telephone Encounter (Signed)
Pt need a refill on ezetimibe sent to [facility]

## 2023-03-04 ENCOUNTER — Other Ambulatory Visit: Payer: Self-pay | Admitting: *Deleted

## 2023-03-04 DIAGNOSIS — K219 Gastro-esophageal reflux disease without esophagitis: Secondary | ICD-10-CM

## 2023-03-04 MED ORDER — PANTOPRAZOLE SODIUM 40 MG PO TBEC: 40.0000 mg | DELAYED_RELEASE_TABLET | Freq: Two times a day (BID) | ORAL | 6 refills | Status: DC

## 2023-04-08 ENCOUNTER — Telehealth: Payer: Self-pay | Admitting: Family Medicine

## 2023-04-08 NOTE — Telephone Encounter (Signed)
Prescription Request  04/08/2023  LOV: 01/28/2023  What is the name of the medication or equipment? simvastatin (ZOCOR) 40 MG tablet  Have you contacted your pharmacy to request a refill? Yes   Which pharmacy would you like this sent to?   [id number] - [place], [place] - [address] AT [facility] OF [address] [facility] [place] [address] Phone: [phone] Fax: [phone]    Patient notified that their request is being sent to the clinical staff for review and that they should receive a response within 2 business days.   Please advise at Mobile [phone] (mobile)

## 2023-04-10 ENCOUNTER — Other Ambulatory Visit: Payer: Self-pay | Admitting: *Deleted

## 2023-04-10 DIAGNOSIS — E782 Mixed hyperlipidemia: Secondary | ICD-10-CM

## 2023-04-10 MED ORDER — SIMVASTATIN 40 MG PO TABS: ORAL_TABLET | ORAL | 3 refills | Status: DC

## 2023-04-10 NOTE — Telephone Encounter (Signed)
I have resent it & pt aware

## 2023-04-10 NOTE — Telephone Encounter (Signed)
Pt need to contact pharmacy, #[id number] was sent in on 01/28/23 with 3 refills.  I also called [facility] to confirm & it looks like they did not receive the e-fax.   Disp Refills Start End   simvastatin (ZOCOR) 40 MG tablet 90 tablet 3 01/28/2023    Sig: TAKE 1 TABLET(40 MG) BY MOUTH DAILY   Sent to pharmacy as: simvastatin (ZOCOR) 40 MG tablet   E-Prescribing Status: Receipt confirmed by pharmacy (01/28/2023 11:08 AM EDT)    Associated Diagnoses  HYPERLIPIDEMIA, MIXED     Pharmacy  [id number] - [place], [place] - [address]

## 2023-04-18 ENCOUNTER — Telehealth: Payer: Self-pay | Admitting: Family Medicine

## 2023-04-18 DIAGNOSIS — R000 Tachycardia, unspecified: Secondary | ICD-10-CM

## 2023-04-18 DIAGNOSIS — I10 Essential (primary) hypertension: Secondary | ICD-10-CM

## 2023-04-18 MED ORDER — METOPROLOL TARTRATE 25 MG PO TABS: 12.5000 mg | ORAL_TABLET | Freq: Two times a day (BID) | ORAL | 1 refills | Status: DC

## 2023-04-18 NOTE — Telephone Encounter (Signed)
Refilled

## 2023-04-18 NOTE — Telephone Encounter (Signed)
Prescription Request  04/18/2023  LOV: 01/28/2023  What is the name of the medication or equipment? metoprolol   Have you contacted your pharmacy to request a refill? Yes   Which pharmacy would you like this sent to?  [id number] - [place], [place] - [address] AT [facility] OF [address] [facility] [address] [place] [address] Phone: [phone] Fax: [phone]    Patient notified that their request is being sent to the clinical staff for review and that they should receive a response within 2 business days.   Please advise at Mobile [phone] (mobile)

## 2023-05-19 DIAGNOSIS — E785 Hyperlipidemia, unspecified: Secondary | ICD-10-CM | POA: Diagnosis not present

## 2023-05-19 DIAGNOSIS — F419 Anxiety disorder, unspecified: Secondary | ICD-10-CM | POA: Diagnosis not present

## 2023-05-19 DIAGNOSIS — J309 Allergic rhinitis, unspecified: Secondary | ICD-10-CM | POA: Diagnosis not present

## 2023-05-19 DIAGNOSIS — K219 Gastro-esophageal reflux disease without esophagitis: Secondary | ICD-10-CM | POA: Diagnosis not present

## 2023-05-19 DIAGNOSIS — M810 Age-related osteoporosis without current pathological fracture: Secondary | ICD-10-CM | POA: Diagnosis not present

## 2023-05-19 DIAGNOSIS — I10 Essential (primary) hypertension: Secondary | ICD-10-CM | POA: Diagnosis not present

## 2023-05-19 DIAGNOSIS — E669 Obesity, unspecified: Secondary | ICD-10-CM | POA: Diagnosis not present

## 2023-05-19 DIAGNOSIS — Z79899 Other long term (current) drug therapy: Secondary | ICD-10-CM | POA: Diagnosis not present

## 2023-05-19 DIAGNOSIS — M1990 Unspecified osteoarthritis, unspecified site: Secondary | ICD-10-CM | POA: Diagnosis not present

## 2023-05-28 ENCOUNTER — Encounter (INDEPENDENT_AMBULATORY_CARE_PROVIDER_SITE_OTHER): Payer: Self-pay

## 2023-06-10 ENCOUNTER — Encounter: Payer: Self-pay | Admitting: Family Medicine

## 2023-06-10 ENCOUNTER — Ambulatory Visit: Payer: Medicare PPO | Admitting: Family Medicine

## 2023-06-10 VITALS — BP 122/72 | HR 61 | Temp 98.0°F | Resp 16 | Ht 61.0 in | Wt 178.5 lb

## 2023-06-10 DIAGNOSIS — K219 Gastro-esophageal reflux disease without esophagitis: Secondary | ICD-10-CM | POA: Diagnosis not present

## 2023-06-10 DIAGNOSIS — R000 Tachycardia, unspecified: Secondary | ICD-10-CM

## 2023-06-10 DIAGNOSIS — E559 Vitamin D deficiency, unspecified: Secondary | ICD-10-CM | POA: Diagnosis not present

## 2023-06-10 DIAGNOSIS — I10 Essential (primary) hypertension: Secondary | ICD-10-CM | POA: Diagnosis not present

## 2023-06-10 DIAGNOSIS — J309 Allergic rhinitis, unspecified: Secondary | ICD-10-CM

## 2023-06-10 MED ORDER — METOPROLOL TARTRATE 25 MG PO TABS: 12.5000 mg | ORAL_TABLET | Freq: Two times a day (BID) | ORAL | 1 refills | Status: DC

## 2023-06-10 MED ORDER — FAMOTIDINE 20 MG PO TABS: 20.0000 mg | ORAL_TABLET | Freq: Every day | ORAL | 3 refills | Status: DC

## 2023-06-10 MED ORDER — VITAMIN D3 25 MCG (1000 UT) PO CAPS: 1000.0000 [IU] | ORAL_CAPSULE | Freq: Every day | ORAL | 3 refills | Status: DC

## 2023-06-10 MED ORDER — PANTOPRAZOLE SODIUM 40 MG PO TBEC: 40.0000 mg | DELAYED_RELEASE_TABLET | Freq: Every evening | ORAL | Status: DC

## 2023-06-10 NOTE — Progress Notes (Signed)
SUBJECTIVE:   Chief Complaint  Patient presents with   Medication Consultation    Pt states medication was changed and following up   HPI Presents to clinic for follow up GERD  No acute concerns today  GERD Has been on long-term use of Protonix 40 mg twice daily.  Tried weaning medication but was ineffective.  Has been taking Pepcid in am and Protonix at night.  Endorses having eaten foods that exacerbate GERD.  Worse at night after eating take out foods.  Hypertension Asymptomatic.  Well-controlled on metoprolol 12.5 mg twice daily.  Denies any weakness, dizziness, headaches or visual changes.  No chest pain, shortness of breath or lower extremity edema  Allergic rhinitis Has improved with use of Allegra and Flonase.   PERTINENT PMH / PSH: HTN CAD Aortic Atherosclerosis Neuropathy OSA Splenic Aneurysm  OBJECTIVE:  BP 122/72   Pulse 61   Temp 98 F (36.7 C)   Resp 16   Ht 5\' 1"  (1.549 m)   Wt 178 lb 8 oz (81 kg)   SpO2 95%   BMI 33.73 kg/m    Physical Exam Vitals reviewed.  Constitutional:      General: She is not in acute distress.    Appearance: Normal appearance. She is normal weight. She is not ill-appearing, toxic-appearing or diaphoretic.  HENT:     Right Ear: Tympanic membrane, ear canal and external ear normal.     Left Ear: Tympanic membrane, ear canal and external ear normal.  Eyes:     General:        Right eye: No discharge.        Left eye: No discharge.     Conjunctiva/sclera: Conjunctivae normal.  Neck:     Thyroid: No thyromegaly or thyroid tenderness.  Cardiovascular:     Rate and Rhythm: Normal rate and regular rhythm.     Heart sounds: Normal heart sounds.  Pulmonary:     Effort: Pulmonary effort is normal.     Breath sounds: Normal breath sounds.  Abdominal:     General: Bowel sounds are normal.  Musculoskeletal:        General: Normal range of motion.  Skin:    General: Skin is warm and dry.  Neurological:     General: No  focal deficit present.     Mental Status: She is alert and oriented to person, place, and time. Mental status is at baseline.  Psychiatric:        Mood and Affect: Mood normal.        Behavior: Behavior normal.        Thought Content: Thought content normal.        Judgment: Judgment normal.     ASSESSMENT/PLAN:  Essential hypertension Assessment & Plan: Chronic.  Well-controlled current medication. Refill metoprolol 12.5 mg twice daily Monitor blood pressure at home goal less than 140/80 per JNC 8 guideline for age.   Orders: -     Metoprolol Tartrate; Take 0.5 tablets (12.5 mg total) by mouth 2 (two) times daily.  Dispense: 90 tablet; Refill: 1  Gastroesophageal reflux disease without esophagitis Assessment & Plan: Chronic.  Long-term use of Protonix 40 mg twice daily not indicated given recent EGD no esophagitis, Barrett's or history of GI bleed  Continue Protonix 40 mg at night Start Pepcid 20 mg in am Avoid foods that worsen symptoms Can restart BID Protonix if symptoms worsen.      Orders: -     Famotidine; Take 1 tablet (  20 mg total) by mouth daily.  Dispense: 90 tablet; Refill: 3 -     Pantoprazole Sodium; Take 1 tablet (40 mg total) by mouth at bedtime. 30 min before food  Vitamin D deficiency -     Vitamin D3; Take 1 capsule (1,000 Units total) by mouth daily.  Dispense: 60 capsule; Refill: 3  Allergic rhinitis, unspecified seasonality, unspecified trigger Assessment & Plan: Improving Continue Allegra 180 mg daily Nettie pot as needed Continue Flonase 2 sprays daily      PDMP reviewed  Return in about 1 week (around 06/17/2023), or leftt shoulder, for PCP.  Dana Allan, MD

## 2023-06-10 NOTE — Patient Instructions (Addendum)
It was a pleasure meeting you today. Thank you for allowing me to take part in your health care.  Our goals for today as we discussed include:  Take Flonase 2 spray in the morning Take Astelin 2 spray at night If no improvement in right ear in 3-4 weeks follow up with PCP   Continue Pepcid 20 mg morning Continue Protonix 40 mg at night  Follow up in 1 week to discuss left shoulder concern  Refills sent for medications requested   If you have any questions or concerns, please do not hesitate to call the office at [phone].  I look forward to our next visit and until then take care and stay safe.  Regards,   Dana Allan, MD   [facility]

## 2023-06-17 ENCOUNTER — Ambulatory Visit: Payer: Medicare PPO | Admitting: Family Medicine

## 2023-06-17 ENCOUNTER — Encounter: Payer: Self-pay | Admitting: Family Medicine

## 2023-06-17 VITALS — BP 136/76 | HR 62 | Temp 98.2°F | Resp 16 | Ht 61.0 in | Wt 179.2 lb

## 2023-06-17 DIAGNOSIS — M25512 Pain in left shoulder: Secondary | ICD-10-CM | POA: Diagnosis not present

## 2023-06-17 DIAGNOSIS — G8929 Other chronic pain: Secondary | ICD-10-CM | POA: Diagnosis not present

## 2023-06-17 MED ORDER — BACLOFEN 10 MG PO TABS: 5.0000 mg | ORAL_TABLET | Freq: Three times a day (TID) | ORAL | 0 refills | Status: DC

## 2023-06-17 MED ORDER — NAPROXEN SODIUM 550 MG PO TABS: 550.0000 mg | ORAL_TABLET | Freq: Two times a day (BID) | ORAL | 0 refills | Status: DC

## 2023-06-17 NOTE — Progress Notes (Signed)
SUBJECTIVE:   Chief Complaint  Patient presents with   Shoulder Pain    1 week follow up   HPI Presents to clinic for left shoulder pain  Shoulder Pain: Patient complaints of left shoulder pain. Has been ongoing for years but noticed more fter Pneumonia injection last November. Pain has become more noticeable since then.  Denies any fevers, weakness, numbness/tingling.  Endorses some difficulty raising arm above head. Previous Left shoulder xray 12/09/2011 Early degenerative changes in the left [facility] joint.  Has not tried conservative treatment     PERTINENT PMH / PSH: OA, bilateral hands OA, bilateral knee Lumbar arthropathy  OBJECTIVE:  BP 136/76   Pulse 62   Temp 98.2 F (36.8 C)   Resp 16   Ht 5\' 1"  (1.549 m)   Wt 179 lb 4 oz (81.3 kg)   SpO2 94%   BMI 33.87 kg/m    Physical Exam Vitals reviewed.  Constitutional:      General: She is not in acute distress.    Appearance: Normal appearance. She is normal weight. She is not ill-appearing, toxic-appearing or diaphoretic.  Eyes:     General:        Right eye: No discharge.        Left eye: No discharge.     Conjunctiva/sclera: Conjunctivae normal.  Cardiovascular:     Rate and Rhythm: Normal rate.     Heart sounds: Normal heart sounds.  Pulmonary:     Effort: Pulmonary effort is normal.  Musculoskeletal:        General: Tenderness present. No swelling, deformity or signs of injury.     Right shoulder: Normal.     Left shoulder: Tenderness present. No swelling or bony tenderness. Decreased range of motion. Normal strength. Normal pulse.     Right upper arm: Normal.     Left upper arm: Normal.     Comments: Left AC joint tenderness  Skin:    General: Skin is warm and dry.  Neurological:     Mental Status: She is alert and oriented to person, place, and time. Mental status is at baseline.  Psychiatric:        Mood and Affect: Mood normal.        Behavior: Behavior normal.        Thought Content: Thought  content normal.        Judgment: Judgment normal.        06/17/2023    8:06 AM 06/10/2023    9:57 AM 01/02/2023    2:58 PM 12/26/2022    8:20 AM 06/26/2022   11:27 AM  Depression screen PHQ 2/9  Decreased Interest 0 0 0 0 0  Down, Depressed, Hopeless 0 0 0 0 0  PHQ - 2 Score 0 0 0 0 0  Altered sleeping 0 0     Tired, decreased energy 0 0     Change in appetite 0 0     Feeling bad or failure about yourself  0 0     Trouble concentrating 0 0     Moving slowly or fidgety/restless 0 0     Suicidal thoughts 0 0     PHQ-9 Score 0 0     Difficult doing work/chores Not difficult at all Not difficult at all         06/17/2023    8:06 AM 06/10/2023    9:57 AM 08/04/2020   10:05 AM  GAD 7 : Generalized Anxiety Score  Nervous, Anxious,  on Edge 0 0 0  Control/stop worrying 0 0 0  Worry too much - different things 0 0 0  Trouble relaxing 0 0 0  Restless 0 0 0  Easily annoyed or irritable 0 0 0  Afraid - awful might happen 0 0 0  Total GAD 7 Score 0 0 0  Anxiety Difficulty Not difficult at all Not difficult at all Not difficult at all    ASSESSMENT/PLAN:  Chronic left shoulder pain Assessment & Plan: Chronic intermittent pain. No red flags. Trial Naproxen and Baclofen Gentle exercises Heat/Ice as needed If no improvement will image Consider Ortho referral at next visit     PDMP reviewed  Return if symptoms worsen or fail to improve, for PCP.  Dana Allan, MD

## 2023-06-17 NOTE — Assessment & Plan Note (Addendum)
Chronic intermittent pain. No red flags. Trial Naproxen and Baclofen Gentle exercises Heat/Ice as needed If no improvement will image Consider Ortho referral at next visit

## 2023-06-17 NOTE — Patient Instructions (Addendum)
It was a pleasure meeting you today. Thank you for allowing me to take part in your health care.  Our goals for today as we discussed include:  Taker Naproxen 1 tablet 2 times a day for 14 days Take Protonix daily Heat/Ice as needed Can use over the counter Biofreeze 4 times a day if needed Can take Tylenol 650 mg three times a day if needed Once pain has resolved can refer to Physical therapy.  If no improvement in 4-6 weeks follow up with PCP   If you have any questions or concerns, please do not hesitate to call the office at [phone].  I look forward to our next visit and until then take care and stay safe.  Regards,   Dana Allan, MD   [facility]

## 2023-06-25 ENCOUNTER — Telehealth: Payer: Self-pay | Admitting: Family Medicine

## 2023-06-25 NOTE — Telephone Encounter (Signed)
Patient just called and states the baclofen (LIORESAL) 10 MG tablet and naproxen sodium (ANAPROX DS) 550 MG tablet is making her have a bad reaction. She states she hasn't been sleeping good, and shortness of breath, and she been throwing up. And also her hand turned red on Tuesday and itching. She would like for someone to call her and let her know what she can do. Her number is [phone].

## 2023-06-26 ENCOUNTER — Other Ambulatory Visit: Payer: Self-pay

## 2023-06-26 ENCOUNTER — Emergency Department
Admission: EM | Admit: 2023-06-26 | Discharge: 2023-06-26 | Disposition: A | Discharge status: Home / Self Care | Payer: Medicare PPO | Source: Home / Self Care | Attending: Emergency Medicine | Admitting: Emergency Medicine

## 2023-06-26 ENCOUNTER — Emergency Department: Payer: Medicare PPO

## 2023-06-26 DIAGNOSIS — R21 Rash and other nonspecific skin eruption: Secondary | ICD-10-CM | POA: Insufficient documentation

## 2023-06-26 DIAGNOSIS — R1013 Epigastric pain: Secondary | ICD-10-CM | POA: Insufficient documentation

## 2023-06-26 DIAGNOSIS — I672 Cerebral atherosclerosis: Secondary | ICD-10-CM | POA: Diagnosis not present

## 2023-06-26 DIAGNOSIS — R42 Dizziness and giddiness: Secondary | ICD-10-CM | POA: Insufficient documentation

## 2023-06-26 DIAGNOSIS — T39395A Adverse effect of other nonsteroidal anti-inflammatory drugs [NSAID], initial encounter: Secondary | ICD-10-CM | POA: Diagnosis not present

## 2023-06-26 DIAGNOSIS — R062 Wheezing: Secondary | ICD-10-CM | POA: Insufficient documentation

## 2023-06-26 DIAGNOSIS — T887XXA Unspecified adverse effect of drug or medicament, initial encounter: Secondary | ICD-10-CM | POA: Insufficient documentation

## 2023-06-26 DIAGNOSIS — T50905A Adverse effect of unspecified drugs, medicaments and biological substances, initial encounter: Secondary | ICD-10-CM

## 2023-06-26 DIAGNOSIS — T481X5A Adverse effect of skeletal muscle relaxants [neuromuscular blocking agents], initial encounter: Secondary | ICD-10-CM | POA: Diagnosis not present

## 2023-06-26 LAB — CBC
HCT: 40.8 % (ref 36.0–46.0)
Hemoglobin: 13.7 g/dL (ref 12.0–15.0)
MCH: 31 pg (ref 26.0–34.0)
MCHC: 33.6 g/dL (ref 30.0–36.0)
MCV: 92.3 fL (ref 80.0–100.0)
Platelets: 167 10*3/uL (ref 150–400)
RBC: 4.42 MIL/uL (ref 3.87–5.11)
RDW: 11.9 % (ref 11.5–15.5)
WBC: 6 10*3/uL (ref 4.0–10.5)
nRBC: 0 % (ref 0.0–0.2)

## 2023-06-26 LAB — BASIC METABOLIC PANEL
Anion gap: 3 — ABNORMAL LOW (ref 5–15)
BUN: 12 mg/dL (ref 8–23)
CO2: 23 mmol/L (ref 22–32)
Calcium: 8.9 mg/dL (ref 8.9–10.3)
Chloride: 111 mmol/L (ref 98–111)
Creatinine, Ser: 0.7 mg/dL (ref 0.44–1.00)
GFR, Estimated: >60 mL/min (ref >60)
Glucose, Bld: 84 mg/dL (ref 70–99)
Potassium: 4.4 mmol/L (ref 3.5–5.1)
Sodium: 137 mmol/L (ref 135–145)

## 2023-06-26 NOTE — Discharge Instructions (Signed)
STOP taking the Naproxen AND the Baclofen  I have added these as drug allergies.  For now, I'd recommend:  - Continue your panoprazole daily - Take Famotidine 20 mg twice daily for 7 days  For the rash/itching: - Take Allegra or equivalent antihistamine (Claritin, Zyrtec)  - Take the Pepcid as this can help with allergic reactions - STOP the medications as above  Drink at least 6-8 glasses of water daily

## 2023-06-26 NOTE — Progress Notes (Signed)
06/26/23: [facility] ED RN Hospital Liaison for [facility] evaluated/screened patient chart for potential or prior engagements pending disposition.  Not identified as high risk for readmission per 1ED/0IP visits in last 12 months.  No SDOH insecurities notes in EMR.  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  Gabriel Cirri RN MSN  [place]  [facility] [place], [facility] Title: ED RN Liaison Email: [email or web address] Direct Dial: Teams chat or secure chat                        Website: [email or web address]     Note: [facility] ED liaison does not counter or interfere with Inpatient Transitions of Care discharge planning or disposition.

## 2023-06-26 NOTE — Telephone Encounter (Signed)
Called pt and gave her the UC and ED if symptoms get worse. She does have heartburn, but going to take Tums. She did states that she does feel better then yesterday.

## 2023-06-26 NOTE — Telephone Encounter (Signed)
Patient just called back again. She said she has stopped the medication. Could someone call her to let her know what she can do.

## 2023-06-26 NOTE — ED Triage Notes (Signed)
Pt comes with c/o dizziness and HTN. Pt had EMS assess her.  Pt states she was started on a new medicine on the 20th. Pt states she then developed a rash and red palms. Pt was never seen for that. Pt states then she has had wheezing, heartburn and did throw up. Pt states she felt tired and dizzy. Pt unsure if related to medicine . Pt states the medicine was Baclofen

## 2023-06-26 NOTE — ED Provider Notes (Signed)
[facility] Provider Note    Event Date/Time   First MD Initiated Contact with Patient 06/26/23 1555     (approximate)   History   Dizziness   HPI  Leah Melendez is a 77 y.o. female  here with dizziness. Pt reports that over the past several days she has had multiple issues. She was started on naproxen and baclofen about a week ago for shoulder pain.  She has been taking it.  Over the last few days, she initially had a red, pruritic rash to her bilateral hands and feet.  She also had some mild wheezing.  She has also had worsening indigestion and epigastric discomfort.  She reports that symptoms all started after she began taking the medications.  She states she has been somewhat off balance and has been drowsier than usual.  No significant episodes of severe vertigo.  No vomiting.  No headache.  No focal numbness or weakness.  No speech difficulty.     Physical Exam   Triage Vital Signs: ED Triage Vitals  Encounter Vitals Group     BP 06/26/23 1455 (!) 165/88     Systolic BP Percentile --      Diastolic BP Percentile --      Pulse Rate 06/26/23 1455 79     Resp 06/26/23 1455 18     Temp 06/26/23 1455 98 F (36.7 C)     Temp src --      SpO2 06/26/23 1455 93 %     Weight --      Height --      Head Circumference --      Peak Flow --      Pain Score 06/26/23 1453 0     Pain Loc --      Pain Education --      Exclude from Growth Chart --     Most recent vital signs: Vitals:   06/26/23 1455  BP: (!) 165/88  Pulse: 79  Resp: 18  Temp: 98 F (36.7 C)  SpO2: 93%     General: Awake, no distress.  CV:  Good peripheral perfusion.  Regular rate and rhythm.  No murmurs. Resp:  Normal work of breathing.  Lungs clear to auscultation bilaterally. Abd:  No distention.  No tenderness.  No guarding or rebound. Other:  Cranial nerves II through XII intact.  Extraocular movements intact with no nystagmus.  Pupils equal, round, reactive.   Strength 5 out of 5 bilateral upper and lower extremities.  Normal sensation to light touch.   ED Results / Procedures / Treatments   Labs (all labs ordered are listed, but only abnormal results are displayed) Labs Reviewed  BASIC METABOLIC PANEL - Abnormal; Notable for the following components:      Result Value   Anion gap 3 (*)    All other components within normal limits  CBC  URINALYSIS, ROUTINE W REFLEX MICROSCOPIC  CBG MONITORING, ED     EKG Normal sinus rhythm, ventricular rate 70.  PR 192, QRS 70, QTc 444.  No acute changes from prior.   RADIOLOGY CT head: Negative CXR: Clear   I also independently reviewed and agree with radiologist interpretations.   PROCEDURES:  Critical Care performed: No   MEDICATIONS ORDERED IN ED: Medications - No data to display   IMPRESSION / MDM / ASSESSMENT AND PLAN / ED COURSE  I reviewed the triage vital signs and the nursing notes.  Differential diagnosis includes, but is not limited to, medication adverse effect, vertigo, orthostasis, dehydration, allergic reaction  Patient's presentation is most consistent with acute presentation with potential threat to life or bodily function.  The patient is on the cardiac monitor to evaluate for evidence of arrhythmia and/or significant heart rate changes  77 yo F here with  multiple complaints.  Primary complaint is mild dizziness and sensation of being unbalanced.  She noticed that her symptoms started with taking baclofen and I suspect this is adverse effect due to the baclofen.  She also has a history of middle ear issues and has had some sinus pressure, query possible component of peripheral vertigo.  She has no focal neurological deficits.  CT head shows no acute intracranial abnormality.  She has no other focal deficits.  Regarding her rash, it is unclear whether this was a transient dermatitis versus allergic reaction.  She has taken naproxen before,  but baclofen is new.  I will add both of these to possible allergy list.  She has no signs of ongoing anaphylaxis.  Lab work reassuring with no leukocytosis or anemia.  BMP shows normal electrolytes.  Chest x-ray is clear.  She has no ongoing wheezing.  Will have her start second-generation antihistamines in addition to continuing her usual treatment for her sinuses.  Will have her also take Pepcid as I suspect she has a component of worsening gastritis/GERD in the setting of taking the naproxen.  Advised her to not take naproxen again in the future as well.  Return precautions given.   FINAL CLINICAL IMPRESSION(S) / ED DIAGNOSES   Final diagnoses:  Dizziness  Adverse effect of drug, initial encounter     Rx / DC Orders   ED Discharge Orders     None        Note:  This document was prepared using Dragon voice recognition software and may include unintentional dictation errors.   Shaune Pollack, MD 06/26/23 [phone]

## 2023-06-27 ENCOUNTER — Encounter: Payer: Self-pay | Admitting: Family Medicine

## 2023-06-27 DIAGNOSIS — R000 Tachycardia, unspecified: Secondary | ICD-10-CM | POA: Insufficient documentation

## 2023-06-27 NOTE — Assessment & Plan Note (Signed)
Improving Continue Allegra 180 mg daily Nettie pot as needed Continue Flonase 2 sprays daily

## 2023-06-27 NOTE — Assessment & Plan Note (Addendum)
Chronic.  Long-term use of Protonix 40 mg twice daily not indicated given recent EGD no esophagitis, Barrett's or history of GI bleed  Continue Protonix 40 mg at night Start Pepcid 20 mg in am Avoid foods that worsen symptoms Can restart BID Protonix if symptoms worsen.

## 2023-06-27 NOTE — Assessment & Plan Note (Signed)
Chronic.  Well-controlled current medication. Refill metoprolol 12.5 mg twice daily Monitor blood pressure at home goal less than 140/80 per JNC 8 guideline for age.

## 2023-07-03 ENCOUNTER — Telehealth: Payer: Self-pay | Admitting: Family Medicine

## 2023-07-03 ENCOUNTER — Telehealth: Payer: Self-pay

## 2023-07-03 NOTE — Telephone Encounter (Signed)
Pt would like to be called regarding a recent ER visit

## 2023-07-03 NOTE — Transitions of Care (Post Inpatient/ED Visit) (Signed)
07/03/2023  Name: Leah Melendez MRN: [id number] DOB: 12-11-1945  Today's TOC FU Call Status: Today's TOC FU Call Status:: Successful TOC FU Call Completed TOC FU Call Complete Date: 07/03/23 Patient's Name and Date of Birth confirmed.  Red on EMMI-ED Discharge Alert Date & Reason:06/28/23 "Scheduled follow-up appt? No"   Transition Care Management Follow-up Telephone Call Date of Discharge: 06/26/23 Discharge Facility[facility]) Type of Discharge: Emergency Department Reason for ED Visit: Other: ("dizziness") How have you been since you were released from the hospital?: Better (Pt pleased to report she is doing so much better as she shares the events that led up to ER visit. She continues to have some ocassional shoulder pain but managed at present. She is able to do all her normal activities/tasks.) Any questions or concerns?: No  Items Reviewed: Did you receive and understand the discharge instructions provided?: Yes Any new allergies since your discharge?:  (pt feels like her sxs(dizziness,decreased appetite,tired,wheezing,heartburn,diarrhea,reddened/skin rash)-and trip to ED was related to possible allergic reaction to Balcofen & Naproxen-it has been added to allergy list) Dietary orders reviewed?: Yes Type of Diet Ordered:: low salt/heart healthy Do you have support at home?: Yes People in Home: alone Name of Support/Comfort Primary Source: daughter and son in law assists her as needed  Medications Reviewed Today: Medications Reviewed Today     Reviewed by Charlyn Minerva, RN (Registered Nurse) on 07/03/23 at 1341  Med List Status: <None>   Medication Order Taking? Sig Documenting Provider Last Dose Status Informant  albuterol (VENTOLIN HFA) 108 (90 Base) MCG/ACT inhaler [id number] Yes INHALE 1 TO 2 PUFFS INTO THE LUNGS EVERY 6 HOURS AS NEEDED FOR WHEEZING OR SHORTNESS OF BREATH OR COUGH McLean-Scocuzza, Pasty Spillers, MD Taking  Active   Ascorbic Acid (VITAMIN C) 500 MG tablet [id number] Yes Take 500 mg by mouth daily. [provider] Taking Active Self           Med Note Truman Hayward   Thu Jul 01, 2019  7:41 PM)    aspirin EC 81 MG tablet [id number] Yes Take 81 mg by mouth daily. [provider] Taking Active   azelastine (ASTELIN) 0.1 % nasal spray [id number] Yes Place 2 sprays into both nostrils as needed. Use in each nostril as directed Dana Allan, MD Taking Active   Calcium Carbonate-Vitamin D (CALCIUM 600/VITAMIN D PO) [id number] Yes Take 1 tablet by mouth daily. [provider] Taking Active   Cholecalciferol (VITAMIN D3) 25 MCG (1000 UT) CAPS [id number] Yes Take 1 capsule (1,000 Units total) by mouth daily. Dana Allan, MD Taking Active   Diclofenac Sodium (VOLTAREN EX) [id number] Yes Apply topically as needed. [provider] Taking Active   ezetimibe (ZETIA) 10 MG tablet [id number] Yes TAKE 1 TABLET(10 MG) BY MOUTH DAILY Dana Allan, MD Taking Active   famotidine (PEPCID) 20 MG tablet [id number] Yes Take 1 tablet (20 mg total) by mouth daily. Dana Allan, MD Taking Active   fexofenadine [facility]) 180 MG tablet [id number] Yes Take 180 mg by mouth daily. [provider] Taking Active Self  fish oil-omega-3 fatty acids 1000 MG capsule [id number] Yes Take 2 g by mouth daily. [provider] Taking Active            Med Note Katrinka Blazing, RICHARD   Thu Jul 01, 2019  7:41 PM)    fluticasone (FLONASE) 50 MCG/ACT nasal spray [id number] Yes Place 2 sprays into both nostrils daily as needed for allergies or rhinitis. Dana Allan,  MD Taking Active   metoprolol tartrate (LOPRESSOR) 25 MG tablet [id number] Yes Take 0.5 tablets (12.5 mg total) by mouth 2 (two) times daily. Dana Allan, MD Taking Active   pantoprazole (PROTONIX) 40 MG tablet [id number] Yes Take 1 tablet (40 mg total) by mouth at bedtime. 30 min before food Dana Allan, MD Taking Active   simvastatin (ZOCOR) 40 MG tablet  [id number] Yes TAKE 1 TABLET(40 MG) BY MOUTH DAILY Dana Allan, MD Taking Active   vitamin E 400 UNIT capsule [id number] Yes Take 400 Units by mouth daily. [provider] Taking Active             Home Care and Equipment/Supplies: Were Home Health Services Ordered?: NA Any new equipment or medical supplies ordered?: NA  Functional Questionnaire: Do you need assistance with bathing/showering or dressing?: No Do you need assistance with meal preparation?: No Do you need assistance with eating?: No Do you have difficulty maintaining continence: No Do you need assistance with getting out of bed/getting out of a chair/moving?: No Do you have difficulty managing or taking your medications?: No  Follow up appointments reviewed: PCP Follow-up appointment confirmed?: Yes Date of PCP follow-up appointment?: 07/11/23 Follow-up Provider: Dr. Clent Ridges [facility] Follow-up appointment confirmed?: NA Do you need transportation to your follow-up appointment?: No (pt confirms she is able to drive herself to appts) Do you understand care options if your condition(s) worsen?: Yes-patient verbalized understanding  SDOH Interventions Today    Flowsheet Row Most Recent Value  SDOH Interventions   Food Insecurity Interventions Intervention Not Indicated  Transportation Interventions Intervention Not Indicated      TOC Interventions Today    Flowsheet Row Most Recent Value  TOC Interventions   TOC Interventions Discussed/Reviewed TOC Interventions Discussed      Interventions Today    Flowsheet Row Most Recent Value  General Interventions   General Interventions Discussed/Reviewed General Interventions Discussed, Doctor Visits  Doctor Visits Discussed/Reviewed PCP, Doctor Visits Discussed  PCP/Specialist Visits Compliance with follow-up visit  Education Interventions   Education Provided Provided Education  Provided Verbal Education On Nutrition, When to see the doctor   Nutrition Interventions   Nutrition Discussed/Reviewed Nutrition Discussed, Fluid intake  Pharmacy Interventions   Pharmacy Dicussed/Reviewed Pharmacy Topics Discussed, Medications and their functions  Safety Interventions   Safety Discussed/Reviewed Safety Discussed       Alessandra Grout [facility] Care Management Care Management Community Coordinator Direct Phone: [phone] Toll Free: [phone] Fax: [phone]

## 2023-07-03 NOTE — Telephone Encounter (Signed)
Called and scheduled pt appointment on 9/13 for ER follow up

## 2023-07-11 ENCOUNTER — Ambulatory Visit: Payer: Medicare PPO | Admitting: Family Medicine

## 2023-08-04 ENCOUNTER — Ambulatory Visit: Payer: Medicare PPO | Admitting: Family Medicine

## 2023-08-04 ENCOUNTER — Encounter: Payer: Self-pay | Admitting: Family Medicine

## 2023-08-04 VITALS — BP 118/66 | HR 62 | Temp 97.9°F | Resp 16 | Ht 61.0 in | Wt 179.1 lb

## 2023-08-04 DIAGNOSIS — Z23 Encounter for immunization: Secondary | ICD-10-CM

## 2023-08-04 DIAGNOSIS — I10 Essential (primary) hypertension: Secondary | ICD-10-CM | POA: Diagnosis not present

## 2023-08-04 DIAGNOSIS — G8929 Other chronic pain: Secondary | ICD-10-CM

## 2023-08-04 DIAGNOSIS — M25512 Pain in left shoulder: Secondary | ICD-10-CM | POA: Diagnosis not present

## 2023-08-04 NOTE — Patient Instructions (Addendum)
It was a pleasure meeting you today. Thank you for allowing me to take part in your health care.  Our goals for today as we discussed include:  Glad you are feeling better   Flu vaccine today  This is a list of the screening recommended for you and due dates:  Health Maintenance  Topic Date Due   Mammogram  08/24/2022   Flu Shot  01/26/2024*   Medicare Annual Wellness Visit  01/02/2024   DTaP/Tdap/Td vaccine (3 - Td or Tdap) 03/22/2030   Pneumonia Vaccine  Completed   DEXA scan (bone density measurement)  Completed   Hepatitis C Screening  Completed   Zoster (Shingles) Vaccine  Completed   HPV Vaccine  Aged Out   Colon Cancer Screening  Discontinued   COVID-19 Vaccine  Discontinued  *Topic was postponed. The date shown is not the original due date.    Follow up as needed  If you have any questions or concerns, please do not hesitate to call the office at [phone].  I look forward to our next visit and until then take care and stay safe.  Regards,   Dana Allan, MD   [facility]

## 2023-08-04 NOTE — Progress Notes (Signed)
SUBJECTIVE:   Chief Complaint  Patient presents with   Follow-up   HPI Patient presents for hospital follow up  Discussed the use of AI scribe software for clinical note transcription with the patient, who gave verbal consent to proceed.  History of Present Illness The patient, with a history of shoulder pain, presented for a follow-up visit after an emergency room visit on August 29th. She reported a reaction to prescribed medications, naproxen and baclofen, which included dizziness, lethargy, and a rash. The patient was taking these medications for shoulder pain, which has since improved. She attributed the improvement to physical activity, specifically yard work involving heavy lifting. Despite the improvement, the patient still experiences some pain when lifting the shoulder and stress in the muscle.  The patient also reported a popping sensation in the ear, which has been managed with Allegra and ear drops. She has been experiencing some difficulty with motivation and energy, particularly in relation to leaving the house and driving. Despite this, she has been maintaining social activities such as attending church and a widow's group. The patient has been managing sleep and pain with Tylenol Extra Strength taken nightly.  The patient has upcoming appointments for a mammogram, bone density test, and gynecologist visit at the end of October. She expressed anticipation for the results of the bone density test, particularly in relation to her neck.    PERTINENT PMH / PSH: As above  OBJECTIVE:  BP 118/66   Pulse 62   Temp 97.9 F (36.6 C)   Resp 16   Ht 5\' 1"  (1.549 m)   Wt 179 lb 2 oz (81.3 kg)   SpO2 96%   BMI 33.85 kg/m    Physical Exam Vitals reviewed.  Constitutional:      General: She is not in acute distress.    Appearance: Normal appearance. She is normal weight. She is not ill-appearing, toxic-appearing or diaphoretic.  Eyes:     General:        Right eye: No  discharge.        Left eye: No discharge.     Conjunctiva/sclera: Conjunctivae normal.  Cardiovascular:     Rate and Rhythm: Normal rate and regular rhythm.     Heart sounds: Normal heart sounds.  Pulmonary:     Effort: Pulmonary effort is normal.     Breath sounds: Normal breath sounds.  Abdominal:     General: Bowel sounds are normal.  Musculoskeletal:        General: Normal range of motion.  Skin:    General: Skin is warm and dry.  Neurological:     General: No focal deficit present.     Mental Status: She is alert and oriented to person, place, and time. Mental status is at baseline.  Psychiatric:        Mood and Affect: Mood normal.        Behavior: Behavior normal.        Thought Content: Thought content normal.        Judgment: Judgment normal.        08/04/2023   11:32 AM 06/17/2023    8:06 AM 06/10/2023    9:57 AM 01/02/2023    2:58 PM 12/26/2022    8:20 AM  Depression screen PHQ 2/9  Decreased Interest 0 0 0 0 0  Down, Depressed, Hopeless 0 0 0 0 0  PHQ - 2 Score 0 0 0 0 0  Altered sleeping 0 0 0    Tired,  decreased energy 0 0 0    Change in appetite 0 0 0    Feeling bad or failure about yourself  0 0 0    Trouble concentrating 0 0 0    Moving slowly or fidgety/restless 0 0 0    Suicidal thoughts 0 0 0    PHQ-9 Score 0 0 0    Difficult doing work/chores Not difficult at all Not difficult at all Not difficult at all        08/04/2023   11:32 AM 06/17/2023    8:06 AM 06/10/2023    9:57 AM 08/04/2020   10:05 AM  GAD 7 : Generalized Anxiety Score  Nervous, Anxious, on Edge 0 0 0 0  Control/stop worrying 0 0 0 0  Worry too much - different things 0 0 0 0  Trouble relaxing 0 0 0 0  Restless 0 0 0 0  Easily annoyed or irritable 0 0 0 0  Afraid - awful might happen 0 0 0 0  Total GAD 7 Score 0 0 0 0  Anxiety Difficulty Not difficult at all Not difficult at all Not difficult at all Not difficult at all    ASSESSMENT/PLAN:  Need for influenza vaccination -      Flu Vaccine Trivalent High Dose (Fluad)  Chronic left shoulder pain Assessment & Plan: Improved with physical activity. Possible muscle strain or arthritis. Adverse reaction to Baclofen and Naproxen. -Continue physical activity and resistance training as tolerated. -Use heat, ice, and Tylenol for pain management. -Avoid Baclofen and Naproxen due to adverse reactions.   Essential hypertension Assessment & Plan: Chronic.  Well-controlled current medication. Continuel metoprolol 12.5 mg twice daily Monitor blood pressure at home goal less than 140/80 per JNC 8 guideline for age.       General Health Maintenance -Administer influenza vaccine today. -Await results of upcoming mammogram, bone density test, and gynecological exam. Follow up as necessary.     PDMP reviewed  Return if symptoms worsen or fail to improve, for PCP.  Dana Allan, MD

## 2023-08-09 ENCOUNTER — Encounter: Payer: Self-pay | Admitting: Family Medicine

## 2023-08-09 NOTE — Assessment & Plan Note (Signed)
Chronic.  Well-controlled current medication. Continuel metoprolol 12.5 mg twice daily Monitor blood pressure at home goal less than 140/80 per JNC 8 guideline for age.

## 2023-08-09 NOTE — Assessment & Plan Note (Signed)
Improved with physical activity. Possible muscle strain or arthritis. Adverse reaction to Baclofen and Naproxen. -Continue physical activity and resistance training as tolerated. -Use heat, ice, and Tylenol for pain management. -Avoid Baclofen and Naproxen due to adverse reactions.

## 2023-08-25 DIAGNOSIS — M8588 Other specified disorders of bone density and structure, other site: Secondary | ICD-10-CM | POA: Diagnosis not present

## 2023-08-25 DIAGNOSIS — N958 Other specified menopausal and perimenopausal disorders: Secondary | ICD-10-CM | POA: Diagnosis not present

## 2023-08-25 DIAGNOSIS — Z6834 Body mass index (BMI) 34.0-34.9, adult: Secondary | ICD-10-CM | POA: Diagnosis not present

## 2023-08-25 DIAGNOSIS — Z01419 Encounter for gynecological examination (general) (routine) without abnormal findings: Secondary | ICD-10-CM | POA: Diagnosis not present

## 2023-08-25 DIAGNOSIS — Z1231 Encounter for screening mammogram for malignant neoplasm of breast: Secondary | ICD-10-CM | POA: Diagnosis not present

## 2023-08-25 LAB — HM MAMMOGRAPHY

## 2024-01-05 ENCOUNTER — Ambulatory Visit (INDEPENDENT_AMBULATORY_CARE_PROVIDER_SITE_OTHER): Payer: Medicare PPO | Admitting: *Deleted

## 2024-01-05 VITALS — BP 128/76 | Ht 61.0 in | Wt 170.0 lb

## 2024-01-05 DIAGNOSIS — Z0000 Encounter for general adult medical examination without abnormal findings: Secondary | ICD-10-CM | POA: Diagnosis not present

## 2024-01-05 NOTE — Patient Instructions (Signed)
 Leah Melendez , Thank you for taking time to come for your Medicare Wellness Visit. I appreciate your ongoing commitment to your health goals. Please review the following plan we discussed and let me know if I can assist you in the future.   Referrals/Orders/Follow-Ups/Clinician Recommendations: None  This is a list of the screening recommended for you and due dates:  Health Maintenance  Topic Date Due   Mammogram  08/24/2022   Medicare Annual Wellness Visit  01/04/2025   DTaP/Tdap/Td vaccine (3 - Td or Tdap) 03/22/2030   Pneumonia Vaccine  Completed   Flu Shot  Completed   DEXA scan (bone density measurement)  Completed   Hepatitis C Screening  Completed   Zoster (Shingles) Vaccine  Completed   HPV Vaccine  Aged Out   Colon Cancer Screening  Discontinued   COVID-19 Vaccine  Discontinued    Advanced directives: (Copy Requested) Please bring a copy of your health care power of attorney and living will to the office to be added to your chart at your convenience. You can mail to [facility] [address] [place], [place] [address] or email to [email or web address]  Next Medicare Annual Wellness Visit scheduled for next year: Yes 01/10/25 @ 10:10

## 2024-01-05 NOTE — Progress Notes (Signed)
 Subjective:   Leah Melendez is a 78 y.o. who presents for a Medicare Wellness preventive visit.  Visit Complete: Virtual I connected with  Leah Melendez on 01/05/24 by a audio enabled telemedicine application and verified that I am speaking with the correct person using two identifiers.  Patient Location: Home  Provider Location: Office/Clinic  I discussed the limitations of evaluation and management by telemedicine. The patient expressed understanding and agreed to proceed.  Vital Signs: Because this visit was a virtual/telehealth visit, some criteria may be missing or patient reported. Any vitals not documented were not able to be obtained and vitals that have been documented are patient reported.  VideoDeclined- This patient declined Librarian, academic. Therefore the visit was completed with audio only.  AWV Questionnaire: Yes: Patient Medicare AWV questionnaire was completed by the patient on 12/30/23; I have confirmed that all information answered by patient is correct and no changes since this date.  Cardiac Risk Factors include: advanced age (>64men, >65 women);dyslipidemia;hypertension;obesity (BMI >30kg/m2)     Objective:    Today's Vitals   01/05/24 1417  BP: 128/76  Weight: 170 lb (77.1 kg)  Height: 5\' 1"  (1.549 m)   Body mass index is 32.12 kg/m.     01/05/2024    2:29 PM 06/26/2023    2:55 PM 01/02/2023    3:03 PM 12/03/2021   12:46 PM 11/16/2021    9:53 AM 11/30/2020   12:47 PM 11/30/2019   12:45 PM  Advanced Directives  Does Patient Have a Medical Advance Directive? Yes No Yes Yes Yes Yes Yes  Type of Estate agent of [place];Living will  Healthcare Power of [place];Living will  Healthcare Power of [place];Living will Healthcare Power of [place];Living will Healthcare Power of [place];Living will  Does patient want to make changes to medical advance directive?   No - Patient declined No - Patient  declined No - Patient declined No - Patient declined No - Patient declined  Copy of Healthcare Power of Attorney in Chart? No - copy requested  No - copy requested No - copy requested No - copy requested No - copy requested No - copy requested    Current Medications (verified) Outpatient Encounter Medications as of 01/05/2024  Medication Sig   albuterol (VENTOLIN HFA) 108 (90 Base) MCG/ACT inhaler INHALE 1 TO 2 PUFFS INTO THE LUNGS EVERY 6 HOURS AS NEEDED FOR WHEEZING OR SHORTNESS OF BREATH OR COUGH   Ascorbic Acid (VITAMIN C) 500 MG tablet Take 500 mg by mouth daily.   aspirin EC 81 MG tablet Take 81 mg by mouth daily.   azelastine (ASTELIN) 0.1 % nasal spray Place 2 sprays into both nostrils as needed. Use in each nostril as directed   Calcium Carbonate-Vitamin D (CALCIUM 600/VITAMIN D PO) Take 1 tablet by mouth daily.   Cholecalciferol (VITAMIN D3) 25 MCG (1000 UT) CAPS Take 1 capsule (1,000 Units total) by mouth daily.   Diclofenac Sodium (VOLTAREN EX) Apply topically as needed.   ezetimibe (ZETIA) 10 MG tablet TAKE 1 TABLET(10 MG) BY MOUTH DAILY   famotidine (PEPCID) 20 MG tablet Take 1 tablet (20 mg total) by mouth daily.   fexofenadine (ALLEGRA) 180 MG tablet Take 180 mg by mouth daily.   fish oil-omega-3 fatty acids 1000 MG capsule Take 2 g by mouth daily.   fluticasone (FLONASE) 50 MCG/ACT nasal spray Place 2 sprays into both nostrils daily as needed for allergies or rhinitis.   metoprolol tartrate (LOPRESSOR) 25  MG tablet Take 0.5 tablets (12.5 mg total) by mouth 2 (two) times daily.   pantoprazole (PROTONIX) 40 MG tablet Take 1 tablet (40 mg total) by mouth at bedtime. 30 min before food   simvastatin (ZOCOR) 40 MG tablet TAKE 1 TABLET(40 MG) BY MOUTH DAILY   vitamin E 400 UNIT capsule Take 400 Units by mouth daily.   No facility-administered encounter medications on file as of 01/05/2024.    Allergies (verified) Dexilant [dexlansoprazole], Sulfonamide derivatives, Baclofen, and  Naproxen   History: Past Medical History:  Diagnosis Date   Abnormal EEG 03/16/2020   Abnormal EKG 08/19/2019   Anxiety    Aortic atherosclerosis (HCC)    Basal cell carcinoma 03/2019   right upper forehead, MOHS   Basal cell carcinoma 10/2012   Left upper back   Body aches 09/28/2022   Bronchitis due to COVID-19 virus 04/11/2021   COVID-19    04/11/21 tx'ed paxlovid on call Dr. Sanda Linger   Disorder of bone and cartilage 11/30/2008   Qualifier: Diagnosis of   By: Jillyn Hidden FNP, Mcarthur Rossetti      Diverticulosis    Dizziness 08/19/2019   Exertional dyspnea 07/15/2019   Fatty liver 2010   GERD (gastroesophageal reflux disease)    H. pylori infection    Hemangioma 2010   Hematuria 02/28/2022   Hiatal hernia    Hyperlipidemia    Hypertension    Insomnia 03/16/2020   Liver hemangioma    Lymphedema 09/10/2019   L>R   Pure hypercholesterolemia 04/01/2020   Sinus tachycardia 09/10/2019   Splenic artery aneurysm (HCC)    Syncope 07/15/2019   Tubular adenoma of colon    Past Surgical History:  Procedure Laterality Date   bilateral lens replacement     CHOLECYSTECTOMY     COLONOSCOPY  2012   KNEE SURGERY  06/29/2007   Meniscus tear, left knee surgery   UPPER GASTROINTESTINAL ENDOSCOPY     Family History  Problem Relation Age of Onset   Hypertension Sister    Skin cancer Sister    Hypertension Sister    Heart attack Sister        24 y.o as of 09/10/19    Anxiety disorder Sister    Hypertension Brother    Colon cancer Neg Hx    Colon polyps Neg Hx    Esophageal cancer Neg Hx    Rectal cancer Neg Hx    Stomach cancer Neg Hx    Social History   Socioeconomic History   Marital status: Widowed    Spouse name: Not on file   Number of children: 2   Years of education: Not on file   Highest education level: 12th grade  Occupational History   Occupation: Retired Engineer, building services  Tobacco Use   Smoking status: Never   Smokeless tobacco: Never   Vaping Use   Vaping status: Never Used  Substance and Sexual Activity   Alcohol use: No   Drug use: No   Sexual activity: Not Currently    Birth control/protection: None  Other Topics Concern   Not on file  Social History Narrative   Has living will.  Widow as of 04/08/18.  Daughters have a copy of living will and daughter Mellody Dance would be HPOA if husband was not able.   1 daughter lives in [place] has 4 kids and 1 daughter in [place] with 2 kids      Would desire CPR.  She would not want heroic measures.  Social Drivers of Corporate investment banker Strain: Low Risk  (12/30/2023)   Overall Financial Resource Strain (CARDIA)    Difficulty of Paying Living Expenses: Not hard at all  Food Insecurity: No Food Insecurity (12/30/2023)   Hunger Vital Sign    Worried About Running Out of Food in the Last Year: Never true    Ran Out of Food in the Last Year: Never true  Transportation Needs: No Transportation Needs (12/30/2023)   PRAPARE - Administrator, Civil Service (Medical): No    Lack of Transportation (Non-Medical): No  Physical Activity: Insufficiently Active (12/30/2023)   Exercise Vital Sign    Days of Exercise per Week: 1 day    Minutes of Exercise per Session: 10 min  Stress: No Stress Concern Present (12/30/2023)   [employer] of Occupational Health - Occupational Stress Questionnaire    Feeling of Stress : Only a little  Social Connections: Moderately Integrated (12/30/2023)   Social Connection and Isolation Panel [NHANES]    Frequency of Communication with Friends and Family: More than three times a week    Frequency of Social Gatherings with Friends and Family: Twice a week    Attends Religious Services: More than 4 times per year    Active Member of [employer] or Organizations: Yes    Attends Banker Meetings: More than 4 times per year    Marital Status: Widowed    Tobacco Counseling Counseling given: Not Answered    Clinical  Intake:  Pre-visit preparation completed: Yes  Pain : No/denies pain     BMI - recorded: 32.12 Nutritional Status: BMI > 30  Obese Nutritional Risks: None Diabetes: No  How often do you need to have someone help you when you read instructions, pamphlets, or other written materials from your doctor or pharmacy?: 1 - Never  Interpreter Needed?: No  Information entered by :: R. Deveon Kisiel LPN   Activities of Daily Living     12/30/2023   11:09 AM  In your present state of health, do you have any difficulty performing the following activities:  Hearing? 0  Vision? 0  Difficulty concentrating or making decisions? 0  Walking or climbing stairs? 0  Dressing or bathing? 0  Doing errands, shopping? 0  Preparing Food and eating ? N  Using the Toilet? N  In the past six months, have you accidently leaked urine? N  Do you have problems with loss of bowel control? N  Managing your Medications? N  Managing your Finances? N  Housekeeping or managing your Housekeeping? N    Patient Care Team: Dana Allan, MD as PCP - General (Family Medicine) Wendi Snipes., MD as Consulting Physician (Internal Medicine) Louis Meckel, MD (Inactive) as Consulting Physician (Gastroenterology)  Indicate any recent Medical Services you may have received from other than [facility] providers in the past year (date may be approximate).     Assessment:   This is a routine wellness examination for [place].  Hearing/Vision screen Hearing Screening - Comments:: No issues Vision Screening - Comments:: glasses   Goals Addressed             This Visit's Progress    Patient Stated       Wants to try to exercise and walk more       Depression Screen     01/05/2024    2:23 PM 08/04/2023   11:32 AM 06/17/2023    8:06 AM 06/10/2023    9:57 AM  01/02/2023    2:58 PM 12/26/2022    8:20 AM 06/26/2022   11:27 AM  PHQ 2/9 Scores  PHQ - 2 Score 0 0 0 0 0 0 0  PHQ- 9 Score 0 0 0 0       Fall Risk      12/30/2023   11:09 AM 08/04/2023   11:32 AM 06/17/2023    8:05 AM 06/10/2023    9:57 AM 01/02/2023    2:57 PM  Fall Risk   Falls in the past year? 0 0 0 0 0  Number falls in past yr: 0 0 0 0 0  Injury with Fall? 0 0 0 0 0  Risk for fall due to : No Fall Risks No Fall Risks No Fall Risks No Fall Risks No Fall Risks  Follow up Falls prevention discussed;Falls evaluation completed Falls evaluation completed Falls evaluation completed Falls evaluation completed     MEDICARE RISK AT HOME:  Medicare Risk at Home Any stairs in or around the home?: (Patient-Rptd) No If so, are there any without handrails?: No Home free of loose throw rugs in walkways, pet beds, electrical cords, etc?: (Patient-Rptd) Yes Adequate lighting in your home to reduce risk of falls?: (Patient-Rptd) Yes Life alert?: (Patient-Rptd) No Use of a cane, walker or w/c?: (Patient-Rptd) No Grab bars in the bathroom?: (Patient-Rptd) No Shower chair or bench in shower?: (Patient-Rptd) Yes Elevated toilet seat or a handicapped toilet?: (Patient-Rptd) No  TIMED UP AND GO:  Was the test performed?  No  Cognitive Function: 6CIT completed    07/29/2017   11:42 AM 07/22/2016    8:50 AM  MMSE - Mini Mental State Exam  Orientation to time 5 5  Orientation to Place 5 5  Registration 3 3  Attention/ Calculation 0 0  Recall 3 3  Language- name 2 objects 0 0  Language- repeat 1 1  Language- follow 3 step command 3 3  Language- read & follow direction 0 0  Write a sentence 0 0  Copy design 0 0  Total score 20 20        01/05/2024    2:29 PM 01/02/2023    3:02 PM 11/30/2020   12:54 PM 11/30/2019   12:52 PM  6CIT Screen  What Year? 0 points 0 points 0 points 0 points  What month? 0 points 0 points 0 points 0 points  What time? 0 points 0 points 0 points 0 points  Count back from 20 0 points 2 points 0 points   Months in reverse 0 points 0 points 0 points   Repeat phrase  0 points    Total Score  2 points       Immunizations Immunization History  Administered Date(s) Administered   Fluad Quad(high Dose 65+) 08/16/2019   Fluad Trivalent(High Dose 65+) 08/04/2023   Influenza Split 07/10/2011   Influenza Whole 07/27/2010, 07/18/2012   Influenza, High Dose Seasonal PF 07/21/2020   Influenza,inj,Quad PF,6+ Mos 07/14/2014   Influenza,inj,quad, With Preservative 08/07/2021   Influenza-Unspecified 06/28/2013, 06/19/2015   PFIZER(Purple Top)SARS-COV-2 Vaccination 12/15/2019, 01/05/2020, 09/27/2020   Pneumococcal Conjugate-13 04/05/2014   Pneumococcal Polysaccharide-23 01/13/2013   Td 11/30/2008   Tdap 03/22/2020   Zoster Recombinant(Shingrix) 01/29/2018, 05/06/2018   Zoster, Live 11/30/2008    Screening Tests Health Maintenance  Topic Date Due   MAMMOGRAM  08/24/2022   Medicare Annual Wellness (AWV)  01/04/2025   DTaP/Tdap/Td (3 - Td or Tdap) 03/22/2030   Pneumonia Vaccine 25+ Years old  Completed   INFLUENZA VACCINE  Completed   DEXA SCAN  Completed   Hepatitis C Screening  Completed   Zoster Vaccines- Shingrix  Completed   HPV VACCINES  Aged Out   Colonoscopy  Discontinued   COVID-19 Vaccine  Discontinued    Health Maintenance  Health Maintenance Due  Topic Date Due   MAMMOGRAM  08/24/2022   Health Maintenance Items Addressed: Patient sees Dr. Marcelle Overlie at [facility]  for her mammograms and bone density.   Additional Screening:  Vision Screening: Recommended annual ophthalmology exams for early detection of glaucoma and other disorders of the eye. Up to date [facility] [place]  Dental Screening: Recommended annual dental exams for proper oral hygiene  Community Resource Referral / Chronic Care Management: CRR required this visit?  No   CCM required this visit?  No     Plan:     I have personally reviewed and noted the following in the patient's chart:   Medical and social history Use of alcohol, tobacco or illicit drugs  Current medications and  supplements including opioid prescriptions. Patient is not currently taking opioid prescriptions. Functional ability and status Nutritional status Physical activity Advanced directives List of other physicians Hospitalizations, surgeries, and ER visits in previous 12 months Vitals Screenings to include cognitive, depression, and falls Referrals and appointments  In addition, I have reviewed and discussed with patient certain preventive protocols, quality metrics, and best practice recommendations. A written personalized care plan for preventive services as well as general preventive health recommendations were provided to patient.     Sydell Axon, LPN   02/08/2439   After Visit Summary: (MyChart) Due to this being a telephonic visit, the after visit summary with patients personalized plan was offered to patient via MyChart   Notes: Nothing significant to report at this time.

## 2024-01-20 ENCOUNTER — Ambulatory Visit: Payer: Medicare PPO | Admitting: Dermatology

## 2024-01-20 ENCOUNTER — Encounter: Payer: Self-pay | Admitting: Dermatology

## 2024-01-20 DIAGNOSIS — D2371 Other benign neoplasm of skin of right lower limb, including hip: Secondary | ICD-10-CM

## 2024-01-20 DIAGNOSIS — D2271 Melanocytic nevi of right lower limb, including hip: Secondary | ICD-10-CM | POA: Diagnosis not present

## 2024-01-20 DIAGNOSIS — L820 Inflamed seborrheic keratosis: Secondary | ICD-10-CM | POA: Diagnosis not present

## 2024-01-20 DIAGNOSIS — L720 Epidermal cyst: Secondary | ICD-10-CM

## 2024-01-20 DIAGNOSIS — Z85828 Personal history of other malignant neoplasm of skin: Secondary | ICD-10-CM

## 2024-01-20 DIAGNOSIS — L821 Other seborrheic keratosis: Secondary | ICD-10-CM

## 2024-01-20 DIAGNOSIS — D1801 Hemangioma of skin and subcutaneous tissue: Secondary | ICD-10-CM

## 2024-01-20 DIAGNOSIS — W908XXA Exposure to other nonionizing radiation, initial encounter: Secondary | ICD-10-CM

## 2024-01-20 DIAGNOSIS — Z1283 Encounter for screening for malignant neoplasm of skin: Secondary | ICD-10-CM

## 2024-01-20 DIAGNOSIS — D229 Melanocytic nevi, unspecified: Secondary | ICD-10-CM

## 2024-01-20 DIAGNOSIS — L578 Other skin changes due to chronic exposure to nonionizing radiation: Secondary | ICD-10-CM

## 2024-01-20 DIAGNOSIS — Z7189 Other specified counseling: Secondary | ICD-10-CM

## 2024-01-20 DIAGNOSIS — L719 Rosacea, unspecified: Secondary | ICD-10-CM

## 2024-01-20 DIAGNOSIS — L814 Other melanin hyperpigmentation: Secondary | ICD-10-CM

## 2024-01-20 DIAGNOSIS — D225 Melanocytic nevi of trunk: Secondary | ICD-10-CM | POA: Diagnosis not present

## 2024-01-20 NOTE — Patient Instructions (Addendum)
 Cryotherapy Aftercare  Wash gently with soap and water everyday.   Apply Vaseline Jelly daily until healed.    Effaclar sample given to be applied at bedtime to small cysts on chin/face, wash off in morning.    Recommend daily broad spectrum sunscreen SPF 30+ to sun-exposed areas, reapply every 2 hours as needed. Call for new or changing lesions.  Staying in the shade or wearing long sleeves, sun glasses (UVA+UVB protection) and wide brim hats (4-inch brim around the entire circumference of the hat) are also recommended for sun protection.    Melanoma ABCDEs  Melanoma is the most dangerous type of skin cancer, and is the leading cause of death from skin disease.  You are more likely to develop melanoma if you: Have light-colored skin, light-colored eyes, or red or blond hair Spend a lot of time in the sun Tan regularly, either outdoors or in a tanning bed Have had blistering sunburns, especially during childhood Have a close family member who has had a melanoma Have atypical moles or large birthmarks  Early detection of melanoma is key since treatment is typically straightforward and cure rates are extremely high if we catch it early.   The first sign of melanoma is often a change in a mole or a new dark spot.  The ABCDE system is a way of remembering the signs of melanoma.  A for asymmetry:  The two halves do not match. B for border:  The edges of the growth are irregular. C for color:  A mixture of colors are present instead of an even brown color. D for diameter:  Melanomas are usually (but not always) greater than 6mm - the size of a pencil eraser. E for evolution:  The spot keeps changing in size, shape, and color.  Please check your skin once per month between visits. You can use a small mirror in front and a large mirror behind you to keep an eye on the back side or your body.   If you see any new or changing lesions before your next follow-up, please call to schedule a  visit.  Please continue daily skin protection including broad spectrum sunscreen SPF 30+ to sun-exposed areas, reapplying every 2 hours as needed when you're outdoors.   Staying in the shade or wearing long sleeves, sun glasses (UVA+UVB protection) and wide brim hats (4-inch brim around the entire circumference of the hat) are also recommended for sun protection.      Due to recent changes in healthcare laws, you may see results of your pathology and/or laboratory studies on MyChart before the doctors have had a chance to review them. We understand that in some cases there may be results that are confusing or concerning to you. Please understand that not all results are received at the same time and often the doctors may need to interpret multiple results in order to provide you with the best plan of care or course of treatment. Therefore, we ask that you please give [place] 2 business days to thoroughly review all your results before contacting the office for clarification. Should we see a critical lab result, you will be contacted sooner.   If You Need Anything After Your Visit  If you have any questions or concerns for your doctor, please call our main line at [phone] and press option 4 to reach your doctor's medical assistant. If no one answers, please leave a voicemail as directed and we will return your call as soon as possible. Messages  left after 4 pm will be answered the following business day.   You may also send [place] a message via MyChart. We typically respond to MyChart messages within 1-2 business days.  For prescription refills, please ask your pharmacy to contact our office. Our fax number is [phone].  If you have an urgent issue when the clinic is closed that cannot wait until the next business day, you can page your doctor at the number below.    Please note that while we do our best to be available for urgent issues outside of office hours, we are not available 24/7.   If  you have an urgent issue and are unable to reach [place], you may choose to seek medical care at your doctor's office, retail clinic, urgent care center, or emergency room.  If you have a medical emergency, please immediately call 911 or go to the emergency department.  Pager Numbers  - Dr. Gwen Pounds: [phone]  - Dr. Roseanne Reno: [phone]  - Dr. Katrinka Blazing: [phone]   In the event of inclement weather, please call our main line at [phone] for an update on the status of any delays or closures.  Dermatology Medication Tips: Please keep the boxes that topical medications come in in order to help keep track of the instructions about where and how to use these. Pharmacies typically print the medication instructions only on the boxes and not directly on the medication tubes.   If your medication is too expensive, please contact our office at [phone] option 4 or send [place] a message through MyChart.   We are unable to tell what your co-pay for medications will be in advance as this is different depending on your insurance coverage. However, we may be able to find a substitute medication at lower cost or fill out paperwork to get insurance to cover a needed medication.   If a prior authorization is required to get your medication covered by your insurance company, please allow [place] 1-2 business days to complete this process.  Drug prices often vary depending on where the prescription is filled and some pharmacies may offer cheaper prices.  The website www.goodrx.com contains coupons for medications through different pharmacies. The prices here do not account for what the cost may be with help from insurance (it may be cheaper with your insurance), but the website can give you the price if you did not use any insurance.  - You can print the associated coupon and take it with your prescription to the pharmacy.  - You may also stop by our office during regular business hours and pick up a GoodRx  coupon card.  - If you need your prescription sent electronically to a different pharmacy, notify our office through [facility] or by phone at [phone] option 4.     Si Usted Necesita Algo Despus de Su Visita  Tambin puede enviarnos un mensaje a travs de Clinical cytogeneticist. Por lo general respondemos a los mensajes de MyChart en el transcurso de 1 a 2 das hbiles.  Para renovar recetas, por favor pida a su farmacia que se ponga en contacto con nuestra oficina. Annie Sable de fax es [place] [phone].  Si tiene un asunto urgente cuando la clnica est cerrada y que no puede esperar hasta el siguiente da hbil, puede llamar/localizar a su doctor(a) al nmero que aparece a continuacin.   Por favor, tenga en cuenta que aunque hacemos todo lo posible para estar disponibles para asuntos urgentes fuera del horario de [place], no  estamos disponibles las 24 horas del da, los 7 [address]  [address] de la [place].   Si tiene un problema urgente y no puede comunicarse con nosotros, puede optar por buscar atencin mdica  en el consultorio de su doctor(a), en una clnica privada, en un centro de atencin urgente o en una sala de emergencias.  Si tiene Engineer, drilling, por favor llame inmediatamente al 911 o vaya a la sala de emergencias.  Nmeros de bper  - Dr. Gwen Pounds: [phone]  - Dra. Roseanne Reno: [phone]  - Dr. Katrinka Blazing: [phone]   En caso de inclemencias del tiempo, por favor llame a Lacy Duverney principal al [phone] para una actualizacin sobre el [place] de cualquier retraso o cierre.  Consejos para la medicacin en dermatologa: Por favor, guarde las cajas en las que vienen los medicamentos de uso tpico para ayudarle a seguir las instrucciones sobre dnde y cmo usarlos. Las farmacias generalmente imprimen las instrucciones del medicamento slo en las cajas y no directamente en los tubos del [place].   Si su medicamento es muy caro, por favor, pngase en contacto con  Rolm Gala llamando al [phone] y presione la opcin 4 o envenos un mensaje a travs de Clinical cytogeneticist.   No podemos decirle cul ser su copago por los medicamentos por adelantado ya que esto es diferente dependiendo de la cobertura de su seguro. Sin embargo, es posible que podamos encontrar un medicamento sustituto a Audiological scientist un formulario para que el seguro cubra el medicamento que se considera necesario.   Si se requiere una autorizacin previa para que su compaa de seguros [place] su medicamento, por favor permtanos de 1 a 2 das hbiles para completar [address]  Los precios de los medicamentos varan con frecuencia dependiendo del Environmental consultant de dnde se surte la receta y alguna farmacias pueden ofrecer precios ms baratos.  El sitio web www.goodrx.com tiene cupones para medicamentos de Health and safety inspector. Los precios aqu no tienen en cuenta lo que podra costar con la ayuda del seguro (puede ser ms barato con su seguro), pero el sitio web puede darle el precio si no utiliz Tourist information centre manager.  - Puede imprimir el cupn correspondiente y llevarlo con su receta a la farmacia.  - Tambin puede pasar por nuestra oficina durante el horario de atencin regular y Education officer, museum una tarjeta de cupones de GoodRx.  - Si necesita que su receta se enve electrnicamente a una farmacia diferente, informe a nuestra oficina a travs de MyChart de [place] o por telfono llamando al [phone] y presione la opcin 4.

## 2024-01-20 NOTE — Progress Notes (Signed)
 Follow-Up Visit   Subjective  Leah Melendez is a 78 y.o. female who presents for the following: Skin Cancer Screening and Full Body Skin Exam. Hx of BCC.   Recheck red spot on nose. Has been Tx with LN2 a couple of years ago. Dx as rosacea at last visit 01/14/2023.  The patient presents for Total-Body Skin Exam (TBSE) for skin cancer screening and mole check. The patient has spots, moles and lesions to be evaluated, some may be new or changing and the patient may have concern these could be cancer.    The following portions of the chart were reviewed this encounter and updated as appropriate: medications, allergies, medical history  Review of Systems:  No other skin or systemic complaints except as noted in HPI or Assessment and Plan.  Objective  Well appearing patient in no apparent distress; mood and affect are within normal limits.  A full examination was performed including scalp, head, eyes, ears, nose, lips, neck, chest, axillae, abdomen, back, buttocks, bilateral upper extremities, bilateral lower extremities, hands, feet, fingers, toes, fingernails, and toenails. All findings within normal limits unless otherwise noted below.   Relevant physical exam findings are noted in the Assessment and Plan.  L posterior lower neck x1, R forearm x1 (2) Erythematous keratotic or waxy stuck-on papule or plaque.  Assessment & Plan   SKIN CANCER SCREENING PERFORMED TODAY.  HISTORY OF BASAL CELL CARCINOMA OF THE SKIN - No evidence of recurrence today - Recommend regular full body skin exams - Recommend daily broad spectrum sunscreen SPF 30+ to sun-exposed areas, reapply every 2 hours as needed.  - Call if any new or changing lesions are noted between office visits   ACTINIC DAMAGE. Chest  - Chronic condition, secondary to cumulative UV/sun exposure - diffuse scaly erythematous macules with underlying dyspigmentation - Recommend daily broad spectrum sunscreen SPF 30+ to  sun-exposed areas, reapply every 2 hours as needed.  - Staying in the shade or wearing long sleeves, sun glasses (UVA+UVB protection) and wide brim hats (4-inch brim around the entire circumference of the hat) are also recommended for sun protection.  - Call for new or changing lesions.  LENTIGINES, SEBORRHEIC KERATOSES, HEMANGIOMAS. Back, chest  - Benign normal skin lesions.  - Benign-appearing - Call for any changes  MELANOCYTIC NEVI - Tan-brown and/or pink-flesh-colored symmetric macules and papules - 9 x 4 mm brown papule lighter edge at left abdomen - 5 x 3 mm medium brown speckled thin papule at right upper back - 2.5 mm medium dark brown macule at right medial upper thigh - Benign appearing on exam today - Observation - Call clinic for new or changing moles - Recommend daily use of broad spectrum spf 30+ sunscreen to sun-exposed areas.    DERMATOFIBROMA Exam: Firm pink/brown papulenodule with dimple sign at right anterior ankle. Treatment Plan: A dermatofibroma is a benign growth possibly related to trauma, such as an insect bite, cut from shaving, or inflamed acne-type bump.  Treatment options to remove include shave or excision with resulting scar and risk of recurrence.  Since benign-appearing and not bothersome, will observe for now.   ROSACEA Exam Mild erythema at nasal tip, telangiectasias at alar crease and cheeks  Chronic and persistent condition with duration or expected duration over one year. Condition is symptomatic/ bothersome to patient. Not currently at goal.  Rosacea is a chronic progressive skin condition usually affecting the face of adults, causing redness and/or acne bumps. It is treatable but not curable. It  sometimes affects the eyes (ocular rosacea) as well. It may respond to topical and/or systemic medication and can flare with stress, sun exposure, alcohol, exercise, topical steroids (including hydrocortisone/cortisone 10) and some foods.  Daily  application of broad spectrum spf 30+ sunscreen to face is recommended to reduce flares.  Patient denies grittiness of the eyes  Treatment Plan Patient deferred Rx treatment at this time.  Recommend daily broad spectrum sunscreen SPF 30+ to sun-exposed areas, reapply every 2 hours as needed. Call for new or changing lesions.  Staying in the shade or wearing long sleeves, sun glasses (UVA+UVB protection) and wide brim hats (4-inch brim around the entire circumference of the hat) are also recommended for sun protection.   Counseling for BBL / IPL / Laser and Coordination of Care Discussed the treatment option of Broad Band Light (BBL) /Intense Pulsed Light (IPL)/ Laser for skin discoloration, including brown spots and redness.  Typically we recommend at least 1-3 treatment sessions about 5-8 weeks apart for best results.  Cannot have tanned skin when BBL performed, and regular use of sunscreen/photoprotection is advised after the procedure to help maintain results. The patient's condition may also require "maintenance treatments" in the future.  The fee for BBL / laser treatments is $350 per treatment session for the whole face.  A fee can be quoted for other parts of the body.  Insurance typically does not pay for BBL/laser treatments and therefore the fee is an out-of-pocket cost. Recommend prophylactic valtrex treatment. Once scheduled for procedure, will send Rx in prior to patient's appointment.    SEBORRHEIC KERATOSIS - 1 cm waxy tan macule with dark brown waxy papule central at left elbow - Benign-appearing - Discussed benign etiology and prognosis. - Observe - Call for any changes  Milia - tiny firm white papule at left chin/right cheek - type of cyst - benign - sometimes these will clear with nightly OTC adapalene/Differin 0.1% gel or retinol. - may be extracted if symptomatic - observe - Effaclar sample given to be applied at bedtime, wash off in morning.    INFLAMED SEBORRHEIC  KERATOSIS (2) L posterior lower neck x1, R forearm x1 (2) Symptomatic, irritating, patient would like treated. Destruction of lesion - L posterior lower neck x1, R forearm x1 (2)  Destruction method: cryotherapy   Informed consent: discussed and consent obtained   Lesion destroyed using liquid nitrogen: Yes   Region frozen until ice ball extended beyond lesion: Yes   Outcome: patient tolerated procedure well with no complications   Post-procedure details: wound care instructions given   Additional details:  Prior to procedure, discussed risks of blister formation, small wound, skin dyspigmentation, or rare scar following cryotherapy. Recommend Vaseline ointment to treated areas while healing.   Return in about 1 year (around 01/19/2025) for TBSE, HxBCC.  I, Lawson Radar, CMA, am acting as scribe for Willeen Niece, MD.   Documentation: I have reviewed the above documentation for accuracy and completeness, and I agree with the above.  Willeen Niece, MD

## 2024-01-21 ENCOUNTER — Other Ambulatory Visit: Payer: Self-pay | Admitting: Family Medicine

## 2024-01-21 DIAGNOSIS — K219 Gastro-esophageal reflux disease without esophagitis: Secondary | ICD-10-CM

## 2024-01-21 DIAGNOSIS — E782 Mixed hyperlipidemia: Secondary | ICD-10-CM

## 2024-02-05 ENCOUNTER — Other Ambulatory Visit: Payer: Self-pay | Admitting: Family Medicine

## 2024-02-05 ENCOUNTER — Ambulatory Visit (INDEPENDENT_AMBULATORY_CARE_PROVIDER_SITE_OTHER): Admitting: Family Medicine

## 2024-02-05 ENCOUNTER — Encounter: Payer: Self-pay | Admitting: Family Medicine

## 2024-02-05 ENCOUNTER — Telehealth: Payer: Self-pay

## 2024-02-05 VITALS — BP 132/70 | HR 62 | Temp 98.2°F | Resp 20 | Ht 61.0 in | Wt 179.0 lb

## 2024-02-05 DIAGNOSIS — M791 Myalgia, unspecified site: Secondary | ICD-10-CM

## 2024-02-05 DIAGNOSIS — K219 Gastro-esophageal reflux disease without esophagitis: Secondary | ICD-10-CM

## 2024-02-05 DIAGNOSIS — E782 Mixed hyperlipidemia: Secondary | ICD-10-CM | POA: Diagnosis not present

## 2024-02-05 DIAGNOSIS — E559 Vitamin D deficiency, unspecified: Secondary | ICD-10-CM

## 2024-02-05 DIAGNOSIS — I700 Atherosclerosis of aorta: Secondary | ICD-10-CM

## 2024-02-05 DIAGNOSIS — E538 Deficiency of other specified B group vitamins: Secondary | ICD-10-CM | POA: Diagnosis not present

## 2024-02-05 DIAGNOSIS — Z0000 Encounter for general adult medical examination without abnormal findings: Secondary | ICD-10-CM

## 2024-02-05 DIAGNOSIS — I10 Essential (primary) hypertension: Secondary | ICD-10-CM

## 2024-02-05 DIAGNOSIS — J309 Allergic rhinitis, unspecified: Secondary | ICD-10-CM | POA: Diagnosis not present

## 2024-02-05 DIAGNOSIS — R7309 Other abnormal glucose: Secondary | ICD-10-CM

## 2024-02-05 LAB — LIPID PANEL
Cholesterol: 142 mg/dL (ref 0–200)
HDL: 52.7 mg/dL (ref >39.00)
LDL Cholesterol: 63 mg/dL (ref 0–99)
NonHDL: 89.4
Total CHOL/HDL Ratio: 3
Triglycerides: 131 mg/dL (ref 0.0–149.0)
VLDL: 26.2 mg/dL (ref 0.0–40.0)

## 2024-02-05 LAB — CBC WITH DIFFERENTIAL/PLATELET
Basophils Absolute: 0 10*3/uL (ref 0.0–0.1)
Basophils Relative: 0.2 % (ref 0.0–3.0)
Eosinophils Absolute: 0.1 10*3/uL (ref 0.0–0.7)
Eosinophils Relative: 2.4 % (ref 0.0–5.0)
HCT: 39.3 % (ref 36.0–46.0)
Hemoglobin: 13.5 g/dL (ref 12.0–15.0)
Lymphocytes Relative: 22.9 % (ref 12.0–46.0)
Lymphs Abs: 1.2 10*3/uL (ref 0.7–4.0)
MCHC: 34.3 g/dL (ref 30.0–36.0)
MCV: 93.7 fl (ref 78.0–100.0)
Monocytes Absolute: 0.4 10*3/uL (ref 0.1–1.0)
Monocytes Relative: 8.3 % (ref 3.0–12.0)
Neutro Abs: 3.5 10*3/uL (ref 1.4–7.7)
Neutrophils Relative %: 66.2 % (ref 43.0–77.0)
Platelets: 180 10*3/uL (ref 150.0–400.0)
RBC: 4.19 Mil/uL (ref 3.87–5.11)
RDW: 12.5 % (ref 11.5–15.5)
WBC: 5.3 10*3/uL (ref 4.0–10.5)

## 2024-02-05 LAB — COMPREHENSIVE METABOLIC PANEL WITH GFR
ALT: 9 U/L (ref 0–35)
AST: 12 U/L (ref 0–37)
Albumin: 4.2 g/dL (ref 3.5–5.2)
Alkaline Phosphatase: 60 U/L (ref 39–117)
BUN: 14 mg/dL (ref 6–23)
CO2: 27 meq/L (ref 19–32)
Calcium: 9.2 mg/dL (ref 8.4–10.5)
Chloride: 105 meq/L (ref 96–112)
Creatinine, Ser: 0.75 mg/dL (ref 0.40–1.20)
GFR: 75.74 mL/min (ref >60.00)
Glucose, Bld: 93 mg/dL (ref 70–99)
Potassium: 4.8 meq/L (ref 3.5–5.1)
Sodium: 139 meq/L (ref 135–145)
Total Bilirubin: 1.4 mg/dL — ABNORMAL HIGH (ref 0.2–1.2)
Total Protein: 7.1 g/dL (ref 6.0–8.3)

## 2024-02-05 LAB — C-REACTIVE PROTEIN: CRP: <1 mg/dL (ref 0.5–20.0)

## 2024-02-05 LAB — VITAMIN B12: Vitamin B-12: 271 pg/mL (ref 211–911)

## 2024-02-05 LAB — TSH: TSH: 2.64 u[IU]/mL (ref 0.35–5.50)

## 2024-02-05 LAB — VITAMIN D 25 HYDROXY (VIT D DEFICIENCY, FRACTURES): VITD: 26 ng/mL — ABNORMAL LOW (ref 30.00–100.00)

## 2024-02-05 LAB — CK: Total CK: 35 U/L (ref 7–177)

## 2024-02-05 MED ORDER — SIMVASTATIN 40 MG PO TABS: ORAL_TABLET | ORAL | 3 refills | Status: DC

## 2024-02-05 MED ORDER — VITAMIN D (ERGOCALCIFEROL) 1.25 MG (50000 UNIT) PO CAPS
50000.0000 [IU] | ORAL_CAPSULE | ORAL | 1 refills | Status: DC
Start: 2024-02-05 — End: 2024-09-10

## 2024-02-05 MED ORDER — PANTOPRAZOLE SODIUM 40 MG PO TBEC: 40.0000 mg | DELAYED_RELEASE_TABLET | Freq: Every evening | ORAL | 3 refills | Status: DC

## 2024-02-05 MED ORDER — EZETIMIBE 10 MG PO TABS: ORAL_TABLET | ORAL | 3 refills | Status: DC

## 2024-02-05 MED ORDER — METOPROLOL TARTRATE 25 MG PO TABS
12.5000 mg | ORAL_TABLET | Freq: Two times a day (BID) | ORAL | 3 refills | Status: DC
Start: 2024-02-05 — End: 2024-09-09

## 2024-02-05 NOTE — Telephone Encounter (Signed)
 Left message to return call to our office.  Okay to relay message to pt. Please document when pt is spoke to.

## 2024-02-05 NOTE — Telephone Encounter (Signed)
-----   Message from Dana Allan sent at 02/05/2024  1:43 PM EDT ----- Vitamin D low.  Stop current Vitamin D.  Start Vitamin D 1.25 mg weekly for 6 months then switch to over the counter Vitamin D 1000iu daily  All other blood work acceptable

## 2024-02-05 NOTE — Progress Notes (Signed)
 SUBJECTIVE:   Chief Complaint  Patient presents with   Follow-up    1 year   HPI Presents to clinic for follow up chronic disease management  Discussed the use of AI scribe software for clinical note transcription with the patient, who gave verbal consent to proceed.  History of Present Illness Leah Melendez is a 78 year old female who presents for an annual check-up and blood work.  She has experienced discomfort in both arms, described as a cramping sensation rather than pain, occurring even without exertion. This discomfort has been present for the last two to three weeks and typically resolves after a few minutes of rest. No pattern related to exertion or specific activities has been noticed. No dizziness, chest pain, shortness of breath, nausea, vomiting, heart racing, sweating, weakness, numbness, or tingling in her arms. She has a history of arthritis, which she believes contributes to some of her aches and discomforts, particularly in her shoulder and neck.  She desires to lose weight and has been more active recently, engaging in yard work such as pulling ivy and raking. She notes that she hired someone to do her mulch this year but has been active in other ways.  She is currently taking medication for cholesterol, including Azalea, and is concerned about her cholesterol levels. She has been on this medication for a while and has not experienced issues until recently. She also takes metoprolol , which she feels has been effective in managing her blood pressure.  She has been using Flonase  and Allegra for allergies, which she feels have been effective. She has not experienced any dizziness since her last episode, which she attributes to the use of these medications.    PERTINENT PMH / PSH: As above  OBJECTIVE:  BP 132/70   Pulse 62   Temp 98.2 F (36.8 C)   Resp 20   Ht 5\' 1"  (1.549 m)   Wt 179 lb (81.2 kg)   SpO2 98%   BMI 33.82 kg/m    Physical Exam Vitals  reviewed.  Constitutional:      General: She is not in acute distress.    Appearance: She is not ill-appearing.  HENT:     Head: Normocephalic.     Right Ear: Tympanic membrane, ear canal and external ear normal.     Left Ear: Tympanic membrane, ear canal and external ear normal.     Nose: Nose normal.     Mouth/Throat:     Mouth: Mucous membranes are moist.  Eyes:     Extraocular Movements: Extraocular movements intact.     Conjunctiva/sclera: Conjunctivae normal.     Pupils: Pupils are equal, round, and reactive to light.  Neck:     Thyroid : No thyromegaly or thyroid  tenderness.     Vascular: No carotid bruit.  Cardiovascular:     Rate and Rhythm: Normal rate and regular rhythm.     Pulses: Normal pulses.     Heart sounds: Normal heart sounds.  Pulmonary:     Effort: Pulmonary effort is normal.     Breath sounds: Normal breath sounds.  Abdominal:     General: Bowel sounds are normal. There is no distension.     Palpations: Abdomen is soft.     Tenderness: There is no abdominal tenderness. There is no right CVA tenderness, left CVA tenderness, guarding or rebound.  Musculoskeletal:        General: Normal range of motion.     Cervical back: Normal range of  motion.     Right lower leg: No edema.     Left lower leg: No edema.  Lymphadenopathy:     Cervical: No cervical adenopathy.  Skin:    Capillary Refill: Capillary refill takes less than 2 seconds.  Neurological:     General: No focal deficit present.     Mental Status: She is alert and oriented to person, place, and time. Mental status is at baseline.     Motor: No weakness.  Psychiatric:        Mood and Affect: Mood normal.        Behavior: Behavior normal.        Thought Content: Thought content normal.        Judgment: Judgment normal.           02/05/2024    8:43 AM 01/05/2024    2:23 PM 08/04/2023   11:32 AM 06/17/2023    8:06 AM 06/10/2023    9:57 AM  Depression screen PHQ 2/9  Decreased Interest 0 0 0 0  0  Down, Depressed, Hopeless 0 0 0 0 0  PHQ - 2 Score 0 0 0 0 0  Altered sleeping 0 0 0 0 0  Tired, decreased energy 0 0 0 0 0  Change in appetite 0 0 0 0 0  Feeling bad or failure about yourself  0 0 0 0 0  Trouble concentrating 0 0 0 0 0  Moving slowly or fidgety/restless 0 0 0 0 0  Suicidal thoughts 0 0 0 0 0  PHQ-9 Score 0 0 0 0 0  Difficult doing work/chores Not difficult at all Not difficult at all Not difficult at all Not difficult at all Not difficult at all      02/05/2024    8:43 AM 08/04/2023   11:32 AM 06/17/2023    8:06 AM 06/10/2023    9:57 AM  GAD 7 : Generalized Anxiety Score  Nervous, Anxious, on Edge 0 0 0 0  Control/stop worrying 0 0 0 0  Worry too much - different things 0 0 0 0  Trouble relaxing 0 0 0 0  Restless 0 0 0 0  Easily annoyed or irritable 0 0 0 0  Afraid - awful might happen 0 0 0 0  Total GAD 7 Score 0 0 0 0  Anxiety Difficulty Not difficult at all Not difficult at all Not difficult at all Not difficult at all    ASSESSMENT/PLAN:  Essential hypertension Assessment & Plan: Blood pressure well-controlled  - Refill metoprolol  25 mg daily  Orders: -     Comprehensive metabolic panel with GFR -     Metoprolol  Tartrate; Take 0.5 tablets (12.5 mg total) by mouth 2 (two) times daily.  Dispense: 90 tablet; Refill: 3  Aortic atherosclerosis (HCC) -     Lipid panel  Muscle pain Assessment & Plan: Intermittent arm discomfort likely due to increased physical activity. No cardiac involvement. Reassured symptoms are musculoskeletal. - Advise use of heat and ice for symptomatic relief. - Check CK levels to rule out muscle damage from cholesterol medication. - Educated on signs of cardiac issues and advised to seek medical attention if symptoms such as chest pain, radiating pain, nausea, or shortness of breath occur.  Orders: -     CBC with Differential/Platelet -     CK -     TSH -     C-reactive protein  Vitamin B 12 deficiency Assessment &  Plan: Check Vitamin B 12  level   Orders: -     Vitamin B12  Vitamin D  deficiency Assessment & Plan: Check Vitamin D  level Low Vitamin D  Restart Vitamin D  weekly   Orders: -     VITAMIN D  25 Hydroxy (Vit-D Deficiency, Fractures)  Gastroesophageal reflux disease without esophagitis Assessment & Plan: Refill Protonix   Orders: -     Pantoprazole  Sodium; Take 1 tablet (40 mg total) by mouth at bedtime. 30 min before food  Dispense: 90 tablet; Refill: 3  HYPERLIPIDEMIA, MIXED Assessment & Plan: Chronic.   Refill Zocor  40 mg daily Refill Zetia  10 mg daily Check fasting lipids   Orders: -     Simvastatin ; TAKE 1 TABLET(40 MG) BY MOUTH DAILY  Dispense: 90 tablet; Refill: 3 -     Ezetimibe ; TAKE 1 TABLET(10 MG) BY MOUTH DAILY  Dispense: 90 tablet; Refill: 3  Allergic rhinitis, unspecified seasonality, unspecified trigger Assessment & Plan: Improvement with Allegra and Flonase . - Continue Allegra and Flonase  as needed.   Annual physical exam Assessment & Plan: HCM Flu vaccine up-to-date Tdap up-to-date Pneumonia up-to-date Shingles up-to-date Colonoscopy 10/2021.  No recommendations for further colon cancer screening. Mammogram up to date,  Due 07/2024 DEXA up-to-date Depression screening negative.     PDMP reviewed  Return if symptoms worsen or fail to improve, for PCP.  Valli Gaw, MD

## 2024-02-05 NOTE — Patient Instructions (Signed)
 It was a pleasure meeting you today. Thank you for allowing me to take part in your health care.  Our goals for today as we discussed include:  We will get some labs today.  If they are abnormal or we need to do something about them, I will call you.  If they are normal, I will send you a message on MyChart (if it is active) or a letter in the mail.  If you don't hear from [place] in 2 weeks, please call the office at the number below.   Refills sent for requested medications    This is a list of the screening recommended for you and due dates:  Health Maintenance  Topic Date Due   Mammogram  08/24/2022   Flu Shot  05/28/2024   Medicare Annual Wellness Visit  01/04/2025   DTaP/Tdap/Td vaccine (3 - Td or Tdap) 03/22/2030   Pneumonia Vaccine  Completed   DEXA scan (bone density measurement)  Completed   Hepatitis C Screening  Completed   Zoster (Shingles) Vaccine  Completed   HPV Vaccine  Aged Out   Meningitis B Vaccine  Aged Out   Colon Cancer Screening  Discontinued   COVID-19 Vaccine  Discontinued      If you have any questions or concerns, please do not hesitate to call the office at [phone].  I look forward to our next visit and until then take care and stay safe.  Regards,   Dana Allan, MD   [facility]

## 2024-02-15 ENCOUNTER — Encounter: Payer: Self-pay | Admitting: Family Medicine

## 2024-02-15 DIAGNOSIS — M791 Myalgia, unspecified site: Secondary | ICD-10-CM | POA: Insufficient documentation

## 2024-02-15 DIAGNOSIS — E538 Deficiency of other specified B group vitamins: Secondary | ICD-10-CM | POA: Insufficient documentation

## 2024-02-15 NOTE — Assessment & Plan Note (Signed)
 Refill Protonix

## 2024-02-15 NOTE — Assessment & Plan Note (Addendum)
 Check Vitamin D  level Low Vitamin D  Restart Vitamin D  weekly

## 2024-02-15 NOTE — Assessment & Plan Note (Signed)
 Blood pressure well-controlled  - Refill metoprolol  25 mg daily

## 2024-02-15 NOTE — Assessment & Plan Note (Signed)
 Check Vitamin B 12 level

## 2024-02-15 NOTE — Assessment & Plan Note (Signed)
 Improvement with Allegra and Flonase . - Continue Allegra and Flonase  as needed.

## 2024-02-15 NOTE — Assessment & Plan Note (Signed)
 Chronic.   Refill Zocor  40 mg daily Refill Zetia  10 mg daily Check fasting lipids

## 2024-02-15 NOTE — Assessment & Plan Note (Signed)
 HCM Flu vaccine up-to-date Tdap up-to-date Pneumonia up-to-date Shingles up-to-date Colonoscopy 10/2021.  No recommendations for further colon cancer screening. Mammogram up to date,  Due 07/2024 DEXA up-to-date Depression screening negative.

## 2024-02-15 NOTE — Assessment & Plan Note (Addendum)
 Intermittent arm discomfort likely due to increased physical activity. No cardiac involvement. Reassured symptoms are musculoskeletal. - Advise use of heat and ice for symptomatic relief. - Check CK levels to rule out muscle damage from cholesterol medication. - Educated on signs of cardiac issues and advised to seek medical attention if symptoms such as chest pain, radiating pain, nausea, or shortness of breath occur.

## 2024-04-21 ENCOUNTER — Other Ambulatory Visit: Payer: Self-pay | Admitting: Obstetrics and Gynecology

## 2024-04-21 DIAGNOSIS — N6459 Other signs and symptoms in breast: Secondary | ICD-10-CM | POA: Diagnosis not present

## 2024-04-21 DIAGNOSIS — N6452 Nipple discharge: Secondary | ICD-10-CM

## 2024-04-23 ENCOUNTER — Ambulatory Visit
Admission: RE | Admit: 2024-04-23 | Discharge: 2024-04-23 | Disposition: A | Discharge status: Home / Self Care | Source: Ambulatory Visit | Attending: Obstetrics and Gynecology | Admitting: Obstetrics and Gynecology

## 2024-04-23 DIAGNOSIS — N6452 Nipple discharge: Secondary | ICD-10-CM

## 2024-05-05 DIAGNOSIS — N6452 Nipple discharge: Secondary | ICD-10-CM | POA: Diagnosis not present

## 2024-05-13 ENCOUNTER — Encounter: Payer: Self-pay | Admitting: Obstetrics and Gynecology

## 2024-05-17 ENCOUNTER — Other Ambulatory Visit: Payer: Self-pay | Admitting: Obstetrics and Gynecology

## 2024-05-17 DIAGNOSIS — N6452 Nipple discharge: Secondary | ICD-10-CM

## 2024-05-31 ENCOUNTER — Encounter: Payer: Self-pay | Admitting: Obstetrics and Gynecology

## 2024-06-07 ENCOUNTER — Encounter: Payer: Self-pay | Admitting: Emergency Medicine

## 2024-06-07 ENCOUNTER — Ambulatory Visit: Payer: Self-pay

## 2024-06-07 ENCOUNTER — Ambulatory Visit
Admission: EM | Admit: 2024-06-07 | Discharge: 2024-06-07 | Disposition: A | Discharge status: Home / Self Care | Attending: Emergency Medicine | Admitting: Emergency Medicine

## 2024-06-07 DIAGNOSIS — R319 Hematuria, unspecified: Secondary | ICD-10-CM | POA: Diagnosis not present

## 2024-06-07 LAB — POCT URINE DIPSTICK
Bilirubin, UA: NEGATIVE
Glucose, UA: NEGATIVE mg/dL
Ketones, POC UA: NEGATIVE mg/dL
Leukocytes, UA: NEGATIVE
Nitrite, UA: NEGATIVE
POC PROTEIN,UA: NEGATIVE
Spec Grav, UA: 1.015 (ref 1.010–1.025)
Urobilinogen, UA: 0.2 U/dL
pH, UA: 6 (ref 5.0–8.0)

## 2024-06-07 MED ORDER — CEPHALEXIN 500 MG PO CAPS: 500.0000 mg | ORAL_CAPSULE | Freq: Four times a day (QID) | ORAL | 0 refills | Status: DC

## 2024-06-07 NOTE — ED Triage Notes (Signed)
 Patient reports lower abdominal pressure x 1 day. Patient reports blood in urine x 1 day.  Patient reports bilateral flank pain. X 1 day.  Rates pain 5/10.

## 2024-06-07 NOTE — Telephone Encounter (Signed)
 Called pt and she stated that she would give us  a call if she needs anything further.

## 2024-06-07 NOTE — ED Provider Notes (Signed)
 Leah Melendez    CSN: [id number] Arrival date & time: 06/07/24  1050      History   Chief Complaint Chief Complaint  Patient presents with   Hematuria   Back Pain    HPI Leah Melendez is a 78 y.o. female.   Patient presents for evaluation of urinary frequency, dysuria, hematuria, lower abdominal pressure primarily to the right side and mid centralized low back pain beginning 1 day ago.  Has not attempted treatment.  Denies fever or vaginal symptoms.  Past Medical History:  Diagnosis Date   Abnormal EEG 03/16/2020   Abnormal EKG 08/19/2019   Anxiety    Aortic atherosclerosis (HCC)    Basal cell carcinoma 03/2019   right upper forehead, MOHS   Basal cell carcinoma 10/2012   Left upper back   Body aches 09/28/2022   Bronchitis due to COVID-19 virus 04/11/2021   COVID-19    04/11/21 tx'ed paxlovid  on call Dr. Debby Molt   Disorder of bone and cartilage 11/30/2008   Qualifier: Diagnosis of   By: Baird FNP, Frieda Kipper      Diverticulosis    Dizziness 08/19/2019   Exertional dyspnea 07/15/2019   Fatty liver 2010   GERD (gastroesophageal reflux disease)    H. pylori infection    Hemangioma 2010   Hematuria 02/28/2022   Hiatal hernia    Hyperlipidemia    Hypertension    Insomnia 03/16/2020   Liver hemangioma    Lymphedema 09/10/2019   L>R   Pure hypercholesterolemia 04/01/2020   Sinus tachycardia 09/10/2019   Splenic artery aneurysm (HCC)    Syncope 07/15/2019   Tubular adenoma of colon     Patient Active Problem List   Diagnosis Date Noted   Vitamin B 12 deficiency 02/15/2024   Muscle pain 02/15/2024   Sinus tachycardia 06/27/2023   Chronic left shoulder pain 06/17/2023   Allergic rhinitis 02/02/2023   Annual physical exam 02/02/2023   Breast cancer screening by mammogram 02/02/2023   Abnormal glucose 12/28/2022   Otitis media 12/28/2022   Chronic venous insufficiency 08/05/2022   Arthritis of lumbar spine 07/15/2022   Facet  hypertrophy of lumbar region 07/01/2022   Splenic artery aneurysm (HCC) 03/20/2022   Diverticulosis 03/20/2022   Chronic right-sided low back pain without sciatica 02/26/2022   Arthritis of knee 11/07/2020   Chronic pain of both knees 08/14/2020   Primary osteoarthritis of both knees 08/08/2020   Obesity (BMI 30-39.9) 08/08/2020   Aortic atherosclerosis (HCC) 04/01/2020   Neuropathy 03/16/2020   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      Home Medications    Prior to Admission medications   Medication Sig Start Date End Date Taking? Authorizing Provider  cephALEXin  (KEFLEX ) 500 MG capsule Take 1 capsule (500 mg total) by mouth 4 (four) times daily. 06/07/24  Yes Catherine Oak, Shelba SAUNDERS, NP  albuterol  (VENTOLIN  HFA) 108 (  90 Base) MCG/ACT inhaler INHALE 1 TO 2 PUFFS INTO THE LUNGS EVERY 6 HOURS AS NEEDED FOR WHEEZING OR SHORTNESS OF BREATH OR COUGH 02/26/22   McLean-Scocuzza, Randine SAILOR, MD  Ascorbic Acid (VITAMIN C) 500 MG tablet Take 500 mg by mouth daily.    [provider]  aspirin EC 81 MG tablet Take 81 mg by mouth daily.    [provider]  azelastine  (ASTELIN ) 0.1 % nasal spray Place 2 sprays into both nostrils as needed. Use in each nostril as directed 01/28/23   Hope Merle, MD  Calcium Carbonate-Vitamin D  (CALCIUM 600/VITAMIN D  PO) Take 1 tablet by mouth daily.    [provider]  Diclofenac  Sodium (VOLTAREN EX) Apply topically as needed.    [provider]  ezetimibe  (ZETIA ) 10 MG tablet TAKE 1 TABLET(10 MG) BY MOUTH DAILY 02/05/24   Hope Merle, MD  fexofenadine (ALLEGRA) 180 MG tablet Take 180 mg by mouth daily.    [provider]  fish oil-omega-3 fatty acids 1000 MG capsule Take 2 g by mouth daily.    [provider]  fluticasone  (FLONASE ) 50 MCG/ACT nasal spray Place 2 sprays into both nostrils daily as needed for allergies or rhinitis. 01/28/23   Hope Merle, MD  metoprolol  tartrate (LOPRESSOR ) 25 MG tablet Take 0.5 tablets (12.5 mg total) by mouth 2 (two) times daily. 02/05/24   Hope Merle, MD  pantoprazole  (PROTONIX ) 40 MG tablet Take 1 tablet (40 mg total) by mouth at bedtime. 30 min before food 02/05/24   Hope Merle, MD  simvastatin  (ZOCOR ) 40 MG tablet TAKE 1 TABLET(40 MG) BY MOUTH DAILY 02/05/24   Hope Merle, MD  Vitamin D , Ergocalciferol , (DRISDOL ) 1.25 MG (50000 UNIT) CAPS capsule Take 1 capsule (50,000 Units total) by mouth every 7 (seven) days. 02/05/24   Hope Merle, MD  vitamin E 400 UNIT capsule Take 400 Units by mouth daily.    [provider]    Family History Family History  Problem Relation Age of Onset   Hypertension Sister    Skin cancer Sister    Hypertension Sister    Heart attack Sister        48 y.o as of 09/10/19    Anxiety disorder Sister    Hypertension Brother    Colon cancer Neg Hx    Colon polyps Neg Hx    Esophageal cancer Neg Hx    Rectal cancer Neg Hx    Stomach cancer Neg Hx     Social History Social History   Tobacco Use   Smoking status: Never   Smokeless tobacco: Never  Vaping Use   Vaping status: Never Used  Substance Use Topics   Alcohol use: No   Drug use: No     Allergies   Dexilant  [dexlansoprazole ], Sulfonamide derivatives, Baclofen , and Naproxen    Review of Systems Review of Systems   Physical Exam Triage Vital Signs ED Triage Vitals  Encounter Vitals Group      BP 06/07/24 1123 (!) 152/79     Girls Systolic BP Percentile --      Girls Diastolic BP Percentile --      Boys Systolic BP Percentile --      Boys Diastolic BP Percentile --      Pulse Rate 06/07/24 1123 67     Resp 06/07/24 1123 18     Temp 06/07/24 1123 98 F (36.7 C)     Temp Source 06/07/24 1123 Oral     SpO2 06/07/24 1123  96 %     Weight --      Height --      Head Circumference --      Peak Flow --      Pain Score 06/07/24 1128 5     Pain Loc --      Pain Education --      Exclude from Growth Chart --    No data found.  Updated Vital Signs BP (!) 152/79 (BP Location: Left Arm)   Pulse 67   Temp 98 F (36.7 C) (Oral)   Resp 18   SpO2 96%   Visual Acuity Right Eye Distance:   Left Eye Distance:   Bilateral Distance:    Right Eye Near:   Left Eye Near:    Bilateral Near:     Physical Exam Constitutional:      Appearance: Normal appearance.  Eyes:     Extraocular Movements: Extraocular movements intact.  Pulmonary:     Effort: Pulmonary effort is normal.  Abdominal:     General: Abdomen is flat. Bowel sounds are normal.     Palpations: Abdomen is soft.     Tenderness: There is abdominal tenderness in the suprapubic area. There is right CVA tenderness. There is no left CVA tenderness.  Neurological:     Mental Status: She is alert and oriented to person, place, and time. Mental status is at baseline.      UC Treatments / Results  Labs (all labs ordered are listed, but only abnormal results are displayed) Labs Reviewed  POCT URINE DIPSTICK - Abnormal; Notable for the following components:      Result Value   Blood, UA trace-intact (*)    All other components within normal limits  URINE CULTURE    EKG   Radiology No results found.  Procedures Procedures (including critical care time)  Medications Ordered in UC Medications - No data to display  Initial Impression / Assessment and Plan / UC Course  I have reviewed the triage vital signs  and the nursing notes.  Pertinent labs & imaging results that were available during my care of the patient were reviewed by me and considered in my medical decision making (see chart for details).  Hematuria  Urinalysis negative, sent for culture, empirically placed on cephalexin  as she is symptomatic, no signs of sepsis, stable for outpatient management, or care advised to follow-up if symptoms persist worsen or recur Final Clinical Impressions(s) / UC Diagnoses   Final diagnoses:  Hematuria, unspecified type     Discharge Instructions      Your urinalysis does not show infection, your urine will be sent to the lab to determine exactly which bacteria is present, if any changes need to be made to your medications you will be notified  Begin use of cephalexin  every 6 hours for 5 days because you are symptomatic  You may use over-the-counter Azo to help minimize your symptoms until antibiotic removes bacteria, this medication will turn your urine orange  Increase your fluid intake through use of water  As always practice good hygiene, wiping front to back and avoidance of scented vaginal products to prevent further irritation  If symptoms continue to persist after use of medication or recur please follow-up with urgent care or your primary doctor as needed    ED Prescriptions     Medication Sig Dispense Auth. Provider   cephALEXin  (KEFLEX ) 500 MG capsule Take 1 capsule (500 mg total) by mouth 4 (four) times daily. 20  capsule Teresa Shelba SAUNDERS, NP      PDMP not reviewed this encounter.   Teresa Shelba SAUNDERS, NP 06/07/24 1144

## 2024-06-07 NOTE — Discharge Instructions (Signed)
 Your urinalysis does not show infection, your urine will be sent to the lab to determine exactly which bacteria is present, if any changes need to be made to your medications you will be notified  Begin use of cephalexin  every 6 hours for 5 days because you are symptomatic  You may use over-the-counter Azo to help minimize your symptoms until antibiotic removes bacteria, this medication will turn your urine orange  Increase your fluid intake through use of water  As always practice good hygiene, wiping front to back and avoidance of scented vaginal products to prevent further irritation  If symptoms continue to persist after use of medication or recur please follow-up with urgent care or your primary doctor as needed

## 2024-06-07 NOTE — Telephone Encounter (Signed)
 FYI Only or Action Required?: FYI only for provider.  Patient was last seen in primary care on 02/05/2024 by Leah Merle, MD.  Called Nurse Triage reporting Vaginal Bleeding.  Symptoms began several days ago.  Interventions attempted: Nothing.  Symptoms are: gradually worsening.  Triage Disposition: Call PCP Within 24 Hours  Patient/caregiver understands and will follow disposition?: Yes - Patient will be going to UC this morning.    Copied from CRM [id number]. Topic: Clinical - Red Word Triage >> Jun 07, 2024  9:29 AM [name] wrote: Red Word that prompted transfer to Nurse Triage: Blood in undwear for a past few days, thinking she has a uti Answer Assessment - Initial Assessment Questions Pt states she has been having milky discharge in right breast. Has seen gynecologist and received CT and mammogram performed recently - didn't see anything abnormal as of this time. Has a MRI upcoming.   1. BLEEDING SEVERITY: Describe the bleeding that you are having. How much bleeding is there?      Mild blood spotting for a few days, darker blood 2. ONSET: When did the bleeding begin? Is it continuing now?     Unable to determine but a few days 5. ABDOMEN PAIN: Do you have any pain? How bad is the pain?  (e.g., Scale 0-10; none, mild, moderate, or severe)     Pain across stomach last night 8. HORMONE MEDICINES: Are you taking any hormone medicines, prescription or over-the-counter? (e.g., birth control pills, estrogen)     No 9. BLOOD THINNER MEDICINES: Do you take any blood thinners? (e.g., Coumadin / warfarin, Pradaxa / dabigatran, aspirin)     aspirin 10. CAUSE: What do you think is causing the bleeding? (e.g., recent gyn surgery, recent gyn procedure; known bleeding disorder, cervical cancer, polycystic ovarian disease, fibroids)         Patient believes it may be a UTI 11. HEMODYNAMIC STATUS: Are you weak or feeling lightheaded? If Yes, ask: Can you stand and walk  normally?        No 12. OTHER SYMPTOMS: What other symptoms are you having with the bleeding? (e.g., passed tissue, vaginal discharge, fever, menstrual-type cramps)       Back pain, nausea, abdominal pain last night, slight burning with urination, vaginal itching  Protocols used: Vaginal Bleeding - Abnormal-A-AH

## 2024-06-08 ENCOUNTER — Ambulatory Visit (HOSPITAL_COMMUNITY): Payer: Self-pay

## 2024-06-08 LAB — URINE CULTURE: Culture: <10000 — AB

## 2024-06-20 ENCOUNTER — Ambulatory Visit
Admission: RE | Admit: 2024-06-20 | Discharge: 2024-06-20 | Disposition: A | Discharge status: Home / Self Care | Source: Ambulatory Visit | Attending: Obstetrics and Gynecology | Admitting: Obstetrics and Gynecology

## 2024-06-20 DIAGNOSIS — N6452 Nipple discharge: Secondary | ICD-10-CM | POA: Diagnosis not present

## 2024-06-20 MED ORDER — GADOPICLENOL 0.5 MMOL/ML IV SOLN
8.0000 mL | Freq: Once | INTRAVENOUS | Status: AC | PRN
  Administered 2024-06-20: 8 mL via INTRAVENOUS

## 2024-06-22 ENCOUNTER — Other Ambulatory Visit: Payer: Self-pay | Admitting: Obstetrics and Gynecology

## 2024-06-22 DIAGNOSIS — R9389 Abnormal findings on diagnostic imaging of other specified body structures: Secondary | ICD-10-CM

## 2024-06-22 DIAGNOSIS — R928 Other abnormal and inconclusive findings on diagnostic imaging of breast: Secondary | ICD-10-CM

## 2024-06-23 ENCOUNTER — Ambulatory Visit
Admission: RE | Admit: 2024-06-23 | Discharge: 2024-06-23 | Disposition: A | Discharge status: Home / Self Care | Source: Ambulatory Visit | Attending: Obstetrics and Gynecology | Admitting: Obstetrics and Gynecology

## 2024-06-23 ENCOUNTER — Inpatient Hospital Stay
Admission: RE | Admit: 2024-06-23 | Discharge: 2024-06-23 | Discharge status: Home / Self Care | Source: Ambulatory Visit | Attending: Obstetrics and Gynecology | Admitting: Obstetrics and Gynecology

## 2024-06-23 ENCOUNTER — Other Ambulatory Visit: Payer: Self-pay | Admitting: Obstetrics and Gynecology

## 2024-06-23 DIAGNOSIS — R9389 Abnormal findings on diagnostic imaging of other specified body structures: Secondary | ICD-10-CM

## 2024-06-23 DIAGNOSIS — R928 Other abnormal and inconclusive findings on diagnostic imaging of breast: Secondary | ICD-10-CM

## 2024-06-23 DIAGNOSIS — N6452 Nipple discharge: Secondary | ICD-10-CM | POA: Diagnosis not present

## 2024-06-24 ENCOUNTER — Other Ambulatory Visit

## 2024-07-02 DIAGNOSIS — N6452 Nipple discharge: Secondary | ICD-10-CM | POA: Diagnosis not present

## 2024-07-23 ENCOUNTER — Other Ambulatory Visit: Payer: Self-pay

## 2024-07-23 MED ORDER — FAMOTIDINE 20 MG PO TABS: 20.0000 mg | ORAL_TABLET | Freq: Every day | ORAL | 1 refills | Status: DC

## 2024-08-24 DIAGNOSIS — E785 Hyperlipidemia, unspecified: Secondary | ICD-10-CM | POA: Diagnosis not present

## 2024-08-24 DIAGNOSIS — K219 Gastro-esophageal reflux disease without esophagitis: Secondary | ICD-10-CM | POA: Diagnosis not present

## 2024-08-24 DIAGNOSIS — F419 Anxiety disorder, unspecified: Secondary | ICD-10-CM | POA: Diagnosis not present

## 2024-08-24 DIAGNOSIS — I2510 Atherosclerotic heart disease of native coronary artery without angina pectoris: Secondary | ICD-10-CM | POA: Diagnosis not present

## 2024-08-24 DIAGNOSIS — Z7982 Long term (current) use of aspirin: Secondary | ICD-10-CM | POA: Diagnosis not present

## 2024-08-24 DIAGNOSIS — J45909 Unspecified asthma, uncomplicated: Secondary | ICD-10-CM | POA: Diagnosis not present

## 2024-08-24 DIAGNOSIS — I129 Hypertensive chronic kidney disease with stage 1 through stage 4 chronic kidney disease, or unspecified chronic kidney disease: Secondary | ICD-10-CM | POA: Diagnosis not present

## 2024-08-24 DIAGNOSIS — I700 Atherosclerosis of aorta: Secondary | ICD-10-CM | POA: Diagnosis not present

## 2024-08-24 DIAGNOSIS — E669 Obesity, unspecified: Secondary | ICD-10-CM | POA: Diagnosis not present

## 2024-09-01 DIAGNOSIS — Z1231 Encounter for screening mammogram for malignant neoplasm of breast: Secondary | ICD-10-CM | POA: Diagnosis not present

## 2024-09-01 DIAGNOSIS — Z6833 Body mass index (BMI) 33.0-33.9, adult: Secondary | ICD-10-CM | POA: Diagnosis not present

## 2024-09-01 DIAGNOSIS — Z01419 Encounter for gynecological examination (general) (routine) without abnormal findings: Secondary | ICD-10-CM | POA: Diagnosis not present

## 2024-09-10 ENCOUNTER — Ambulatory Visit

## 2024-09-10 VITALS — BP 102/64 | HR 61 | Temp 98.1°F | Ht 60.5 in | Wt 175.8 lb

## 2024-09-10 DIAGNOSIS — Z79899 Other long term (current) drug therapy: Secondary | ICD-10-CM | POA: Diagnosis not present

## 2024-09-10 DIAGNOSIS — K219 Gastro-esophageal reflux disease without esophagitis: Secondary | ICD-10-CM

## 2024-09-10 DIAGNOSIS — R7309 Other abnormal glucose: Secondary | ICD-10-CM | POA: Diagnosis not present

## 2024-09-10 DIAGNOSIS — I10 Essential (primary) hypertension: Secondary | ICD-10-CM | POA: Diagnosis not present

## 2024-09-10 DIAGNOSIS — E669 Obesity, unspecified: Secondary | ICD-10-CM

## 2024-09-10 DIAGNOSIS — N6452 Nipple discharge: Secondary | ICD-10-CM

## 2024-09-10 DIAGNOSIS — E559 Vitamin D deficiency, unspecified: Secondary | ICD-10-CM

## 2024-09-10 DIAGNOSIS — I700 Atherosclerosis of aorta: Secondary | ICD-10-CM

## 2024-09-10 DIAGNOSIS — E782 Mixed hyperlipidemia: Secondary | ICD-10-CM | POA: Diagnosis not present

## 2024-09-10 DIAGNOSIS — M8580 Other specified disorders of bone density and structure, unspecified site: Secondary | ICD-10-CM | POA: Diagnosis not present

## 2024-09-10 DIAGNOSIS — Z780 Asymptomatic menopausal state: Secondary | ICD-10-CM

## 2024-09-10 LAB — VITAMIN B12: Vitamin B-12: 252 pg/mL (ref 211–911)

## 2024-09-10 LAB — VITAMIN D 25 HYDROXY (VIT D DEFICIENCY, FRACTURES): VITD: 42.62 ng/mL (ref 30.00–100.00)

## 2024-09-10 LAB — COMPREHENSIVE METABOLIC PANEL WITH GFR
ALT: 10 U/L (ref 0–35)
AST: 15 U/L (ref 0–37)
Albumin: 4.1 g/dL (ref 3.5–5.2)
Alkaline Phosphatase: 62 U/L (ref 39–117)
BUN: 17 mg/dL (ref 6–23)
CO2: 30 meq/L (ref 19–32)
Calcium: 9.2 mg/dL (ref 8.4–10.5)
Chloride: 104 meq/L (ref 96–112)
Creatinine, Ser: 0.73 mg/dL (ref 0.40–1.20)
GFR: 77.91 mL/min (ref >60.00)
Glucose, Bld: 82 mg/dL (ref 70–99)
Potassium: 4.5 meq/L (ref 3.5–5.1)
Sodium: 139 meq/L (ref 135–145)
Total Bilirubin: 1.5 mg/dL — ABNORMAL HIGH (ref 0.2–1.2)
Total Protein: 6.7 g/dL (ref 6.0–8.3)

## 2024-09-10 MED ORDER — SIMVASTATIN 40 MG PO TABS: ORAL_TABLET | ORAL | 3 refills | Status: AC

## 2024-09-10 MED ORDER — EZETIMIBE 10 MG PO TABS
ORAL_TABLET | ORAL | 3 refills | Status: AC
Start: 2024-09-10 — End: ?

## 2024-09-10 MED ORDER — METOPROLOL TARTRATE 25 MG PO TABS: 12.5000 mg | ORAL_TABLET | Freq: Two times a day (BID) | ORAL | 3 refills | Status: AC

## 2024-09-10 MED ORDER — FAMOTIDINE 20 MG PO TABS: 20.0000 mg | ORAL_TABLET | Freq: Every day | ORAL | 1 refills | Status: AC

## 2024-09-10 MED ORDER — PANTOPRAZOLE SODIUM 40 MG PO TBEC: 40.0000 mg | DELAYED_RELEASE_TABLET | Freq: Every evening | ORAL | 3 refills | Status: AC

## 2024-09-10 NOTE — Assessment & Plan Note (Addendum)
 On chronic Pantoprazole , check vitamin B 12 level today. Lab ordered.  Orders:   B12

## 2024-09-10 NOTE — Assessment & Plan Note (Addendum)
 BP within goal. No chest pain, palpitations, continue Metoprolol  tartrate 12.5 mg BID (1/2 tablet of 25 mg). Check CMP.  Orders:   metoprolol  tartrate (LOPRESSOR ) 25 MG tablet; Take 0.5 tablets (12.5 mg total) by mouth 2 (two) times daily.   Comprehensive metabolic panel with GFR

## 2024-09-10 NOTE — Assessment & Plan Note (Deleted)
  Orders:   B12

## 2024-09-10 NOTE — Assessment & Plan Note (Addendum)
 Diet: Emphasize whole grains, lean proteins, fruits, and vegetables. Limit processed foods and sugary drinks.  Exercise: Aim for 150 minutes of moderate aerobic activity weekly plus strength training twice a week. Weight Loss: Target 5%

## 2024-09-10 NOTE — Patient Instructions (Addendum)
 Please reach out to Dr. Frankey office about follow up on bone density scan. You had it last done in 08/15/2021. Please update us  with what his office tells you.    Diet: Emphasize whole grains, lean proteins, fruits, and vegetables. Limit processed foods and sugary drinks. Exercise: Aim for 150 minutes of moderate aerobic activity weekly plus strength training twice a week. Weight Loss: Target 5%

## 2024-09-10 NOTE — Assessment & Plan Note (Deleted)
 Leah Melendez

## 2024-09-10 NOTE — Assessment & Plan Note (Addendum)
 Chronic, continue current medications.Reviewed lipid panel from 01/2024, LDL within goal, plan on checking fasting lipid panel during follow up visit.  Orders:   ezetimibe  (ZETIA ) 10 MG tablet; TAKE 1 TABLET(10 MG) BY MOUTH DAILY   simvastatin  (ZOCOR ) 40 MG tablet; TAKE 1 TABLET(40 MG) BY MOUTH DAILY

## 2024-09-10 NOTE — Assessment & Plan Note (Addendum)
 Chronic, previously treated with 50000 units of vitamin D  weekly, currently taking OTC vitamin D , continue. Repeat vitamin D  level today.  Orders:   Vitamin D  (25 hydroxy)

## 2024-09-10 NOTE — Assessment & Plan Note (Signed)
 Noted on DEXA from 08/15/2021 of b/l hip. She has had DEXA scan done through her gynecologist in the past. Recommend patient reaches out to Dr. Frankey office to schedule bone density scan. If he is not able to I encourage patient to reach out to our office for further management.  Discussed importance of vitamin D  and calcium optimization.

## 2024-09-10 NOTE — Assessment & Plan Note (Addendum)
 Chronic, stable on Protonix  40 mg daily in the morning and Famotidine  20 mg at night time. Continue. Refill sent.  Orders:   pantoprazole  (PROTONIX ) 40 MG tablet; Take 1 tablet (40 mg total) by mouth at bedtime. 30 min before food   famotidine  (PEPCID ) 20 MG tablet; Take 1 tablet (20 mg total) by mouth daily.

## 2024-09-10 NOTE — Assessment & Plan Note (Signed)
 Recommend patient continue f/u ob/gyn and surgery for right nipple discharge.

## 2024-09-10 NOTE — Progress Notes (Signed)
 Established Patient Office Visit   Subjective  Patient ID: Leah Melendez, female    DOB: Feb 21, 1946  Age: 78 y.o. MRN: [id number]  Chief Complaint  Patient presents with   Breast Discharge    Discussed the use of AI scribe software for clinical note transcription with the patient, who gave verbal consent to proceed.  History of Present Illness Leah Melendez is a 78 year old female who presents for a transfer of care from previous PCP and chronic medication management.   She has experienced breast discharge since the summer, with soreness and occasional pain in the breast area. She underwent several diagnostic tests, including scans, an MRI, and a mammogram. A biopsy was considered but not performed. She saw a surgeon two weeks ago and is awaiting results from a recent mammogram. There have been no significant changes in the discharge since the summer.   She has a history of osteopenia and experiences pain in her right hip, particularly at night, which she manages with Tylenol . Her last bone scan was in 2022. She takes vitamin D  and calcium supplements to manage her bone health.  She has a history of acid reflux, managed with famotidine  at night and pantoprazole  in the morning and famotidine  in the evening. She also has a hiatal hernia and an abdominal aneurysm that has been stable for ten years. Her reflux symptoms are stable with careful diet and medication management.  She takes metoprolol  for blood pressure, which she finds effective, and Zetia  and simvastatin  for cholesterol management. She has a history of low vitamin D  and takes supplements for it.  She experiences occasional wheezing, particularly at night or after working in the yard, but has not needed her albuterol  inhaler recently. She takes Allegra for allergies, which has helped reduce her need for the inhaler.  She reports a history of neuropathy with occasional burning sensations in her legs, which usually  resolve when she lies down. She has experienced swelling in her feet in the past, but it has improved recently.  She has a history of passing out in 2022, which was attributed to anxiety, with no similar episodes since then.    ROS As per HPI    Objective:     BP 102/64 (BP Location: Right Arm, Patient Position: Sitting, Cuff Size: Normal)   Pulse 61   Temp 98.1 F (36.7 C) (Oral)   Ht 5' 0.5 (1.537 m)   Wt 175 lb 12.8 oz (79.7 kg)   SpO2 97%   BMI 33.77 kg/m      09/10/2024   10:10 AM 02/05/2024    8:43 AM 01/05/2024    2:23 PM  Depression screen PHQ 2/9  Decreased Interest 0 0 0  Down, Depressed, Hopeless 0 0 0  PHQ - 2 Score 0 0 0  Altered sleeping 0 0 0  Tired, decreased energy 0 0 0  Change in appetite 0 0 0  Feeling bad or failure about yourself  0 0 0  Trouble concentrating 0 0 0  Moving slowly or fidgety/restless 0 0 0  Suicidal thoughts 0 0 0  PHQ-9 Score 0 0  0   Difficult doing work/chores Not difficult at all Not difficult at all Not difficult at all     Data saved with a previous flowsheet row definition      09/10/2024   10:10 AM 02/05/2024    8:43 AM 08/04/2023   11:32 AM 06/17/2023    8:06 AM  GAD 7 :  Generalized Anxiety Score  Nervous, Anxious, on Edge 0 0 0 0  Control/stop worrying 0 0 0 0  Worry too much - different things 0 0 0 0  Trouble relaxing 0 0 0 0  Restless 0 0 0 0  Easily annoyed or irritable 0 0 0 0  Afraid - awful might happen 0 0 0 0  Total GAD 7 Score 0 0 0 0  Anxiety Difficulty Not difficult at all Not difficult at all Not difficult at all Not difficult at all      09/10/2024   10:10 AM 02/05/2024    8:43 AM 01/05/2024    2:23 PM  Depression screen PHQ 2/9  Decreased Interest 0 0 0  Down, Depressed, Hopeless 0 0 0  PHQ - 2 Score 0 0 0  Altered sleeping 0 0 0  Tired, decreased energy 0 0 0  Change in appetite 0 0 0  Feeling bad or failure about yourself  0 0 0  Trouble concentrating 0 0 0  Moving slowly or  fidgety/restless 0 0 0  Suicidal thoughts 0 0 0  PHQ-9 Score 0 0  0   Difficult doing work/chores Not difficult at all Not difficult at all Not difficult at all     Data saved with a previous flowsheet row definition      09/10/2024   10:10 AM 02/05/2024    8:43 AM 08/04/2023   11:32 AM 06/17/2023    8:06 AM  GAD 7 : Generalized Anxiety Score  Nervous, Anxious, on Edge 0 0 0 0  Control/stop worrying 0 0 0 0  Worry too much - different things 0 0 0 0  Trouble relaxing 0 0 0 0  Restless 0 0 0 0  Easily annoyed or irritable 0 0 0 0  Afraid - awful might happen 0 0 0 0  Total GAD 7 Score 0 0 0 0  Anxiety Difficulty Not difficult at all Not difficult at all Not difficult at all Not difficult at all   SDOH Screenings   Food Insecurity: No Food Insecurity (12/30/2023)  Housing: Unknown (07/02/2024)   Received from [facility]  Transportation Needs: No Transportation Needs (12/30/2023)  Utilities: Not At Risk (01/05/2024)  Alcohol Screen: Low Risk  (12/30/2023)  Depression (PHQ2-9): Low Risk  (09/10/2024)  Financial Resource Strain: Low Risk  (12/30/2023)  Physical Activity: Insufficiently Active (12/30/2023)  Social Connections: Moderately Integrated (12/30/2023)  Stress: No Stress Concern Present (12/30/2023)  Tobacco Use: Low Risk  (09/10/2024)  Health Literacy: Adequate Health Literacy (01/05/2024)     Physical Exam Constitutional:      General: She is not in acute distress.    Appearance: Normal appearance. She is obese.  HENT:     Head: Normocephalic and atraumatic.     Right Ear: Tympanic membrane and external ear normal.     Left Ear: Tympanic membrane and external ear normal.     Mouth/Throat:     Mouth: Mucous membranes are moist.  Eyes:     Conjunctiva/sclera: Conjunctivae normal.  Neck:     Thyroid : No thyroid  mass or thyroid  tenderness.  Cardiovascular:     Rate and Rhythm: Normal rate and regular rhythm.  Pulmonary:     Effort: Pulmonary effort is normal.      Breath sounds: Normal breath sounds.  Abdominal:     General: Bowel sounds are normal.     Palpations: Abdomen is soft.     Tenderness: There is no abdominal  tenderness. There is no guarding.  Musculoskeletal:     Cervical back: Neck supple. No rigidity.     Right lower leg: No edema.     Left lower leg: No edema.     Comments: B/L lower legs varicose veins without ulcerations.   Skin:    General: Skin is warm.  Neurological:     Mental Status: She is alert and oriented to person, place, and time.     Gait: Gait normal.  Psychiatric:        Mood and Affect: Mood normal.        Behavior: Behavior normal.        No results found for any visits on 09/10/24.  The 10-year ASCVD risk score (Arnett DK, et al., 2019) is: 21.3%     Assessment & Plan:   Okay to apply Voltaren gel prn on b/l knee for pain. Continue to monitor right dorsal foot tingling/burning sensation. If worsen consider Gabapentin 100 mg daily. Patient not interested to start this yet.   Assessment & Plan HYPERLIPIDEMIA, MIXED Chronic, continue current medications.Reviewed lipid panel from 01/2024, LDL within goal, plan on checking fasting lipid panel during follow up visit.  Orders:   ezetimibe  (ZETIA ) 10 MG tablet; TAKE 1 TABLET(10 MG) BY MOUTH DAILY   simvastatin  (ZOCOR ) 40 MG tablet; TAKE 1 TABLET(40 MG) BY MOUTH DAILY  Essential hypertension BP within goal. No chest pain, palpitations, continue Metoprolol  tartrate 12.5 mg BID (1/2 tablet of 25 mg). Check CMP.  Orders:   metoprolol  tartrate (LOPRESSOR ) 25 MG tablet; Take 0.5 tablets (12.5 mg total) by mouth 2 (two) times daily.   Comprehensive metabolic panel with GFR  Gastroesophageal reflux disease without esophagitis Chronic, stable on Protonix  40 mg daily in the morning and Famotidine  20 mg at night time. Continue. Refill sent.  Orders:   pantoprazole  (PROTONIX ) 40 MG tablet; Take 1 tablet (40 mg total) by mouth at bedtime. 30 min before food    famotidine  (PEPCID ) 20 MG tablet; Take 1 tablet (20 mg total) by mouth daily.  Abnormal glucose Check A1C, lifestyle modifications as mentioned in obesity plan.  Orders:   Hemoglobin A1c  Obesity (BMI 30-39.9) Diet: Emphasize whole grains, lean proteins, fruits, and vegetables. Limit processed foods and sugary drinks. Exercise: Aim for 150 minutes of moderate aerobic activity weekly plus strength training twice a week. Weight Loss: Target 5%     Vitamin D  deficiency Chronic, previously treated with 50000 units of vitamin D  weekly, currently taking OTC vitamin D , continue. Repeat vitamin D  level today.  Orders:   Vitamin D  (25 hydroxy)  Medication management On chronic Pantoprazole , check vitamin B 12 level today. Lab ordered.  Orders:   B12  Osteopenia after menopause Noted on DEXA from 08/15/2021 of b/l hip. She has had DEXA scan done through her gynecologist in the past. Recommend patient reaches out to Dr. Frankey office to schedule bone density scan. If he is not able to I encourage patient to reach out to our office for further management.  Discussed importance of vitamin D  and calcium optimization.     Nipple discharge Recommend patient continue f/u ob/gyn and surgery for right nipple discharge.     I personally spent a total of 50 minutes in the care of the patient today including preparing to see the patient, getting/reviewing separately obtained history, performing a medically appropriate exam/evaluation, counseling and educating, placing orders, documenting clinical information in the EHR, independently interpreting results, and communicating results.   Return in  about 4 months (around 01/08/2025) for [place] follow up .   Luke Shade, MD

## 2024-09-10 NOTE — Assessment & Plan Note (Addendum)
 Check A1C, lifestyle modifications as mentioned in obesity plan.  Orders:   Hemoglobin A1c

## 2024-09-13 LAB — HEMOGLOBIN A1C: Hgb A1c MFr Bld: 5.3 % (ref 4.6–6.5)

## 2024-09-14 ENCOUNTER — Ambulatory Visit: Payer: Self-pay

## 2024-09-14 ENCOUNTER — Telehealth: Payer: Self-pay

## 2024-09-14 DIAGNOSIS — E538 Deficiency of other specified B group vitamins: Secondary | ICD-10-CM

## 2024-09-14 MED ORDER — CYANOCOBALAMIN 500 MCG PO TABS: 500.0000 ug | ORAL_TABLET | Freq: Every day | ORAL | 3 refills | Status: AC

## 2024-09-14 NOTE — Telephone Encounter (Signed)
 Copied from CRM #[id number]. Topic: General - Call Back - No Documentation >> Sep 14, 2024 10:29 AM Rea BROCKS wrote: Reason for CRM: Patient wanted to make Dr. Abbey aware, patient stated that she's not due for a Bone Density Test until after October 28th, 2026. Patient is not sure if Dr. Abbey has Dr. Meda results from the last time she had a bone density but she's not due until after October next year.

## 2024-11-09 ENCOUNTER — Emergency Department

## 2024-11-09 ENCOUNTER — Encounter: Payer: Self-pay | Admitting: Emergency Medicine

## 2024-11-09 ENCOUNTER — Emergency Department
Admission: EM | Admit: 2024-11-09 | Discharge: 2024-11-09 | Disposition: A | Discharge status: Home / Self Care | Attending: Emergency Medicine | Admitting: Emergency Medicine

## 2024-11-09 ENCOUNTER — Other Ambulatory Visit: Payer: Self-pay

## 2024-11-09 DIAGNOSIS — M5416 Radiculopathy, lumbar region: Secondary | ICD-10-CM | POA: Diagnosis not present

## 2024-11-09 DIAGNOSIS — I10 Essential (primary) hypertension: Secondary | ICD-10-CM | POA: Diagnosis not present

## 2024-11-09 DIAGNOSIS — R300 Dysuria: Secondary | ICD-10-CM | POA: Diagnosis not present

## 2024-11-09 DIAGNOSIS — E785 Hyperlipidemia, unspecified: Secondary | ICD-10-CM | POA: Diagnosis not present

## 2024-11-09 DIAGNOSIS — G4700 Insomnia, unspecified: Secondary | ICD-10-CM | POA: Diagnosis not present

## 2024-11-09 DIAGNOSIS — W01190A Fall on same level from slipping, tripping and stumbling with subsequent striking against furniture, initial encounter: Secondary | ICD-10-CM | POA: Insufficient documentation

## 2024-11-09 DIAGNOSIS — M4696 Unspecified inflammatory spondylopathy, lumbar region: Secondary | ICD-10-CM | POA: Insufficient documentation

## 2024-11-09 DIAGNOSIS — I890 Lymphedema, not elsewhere classified: Secondary | ICD-10-CM | POA: Diagnosis not present

## 2024-11-09 DIAGNOSIS — W010XXA Fall on same level from slipping, tripping and stumbling without subsequent striking against object, initial encounter: Secondary | ICD-10-CM | POA: Insufficient documentation

## 2024-11-09 DIAGNOSIS — K219 Gastro-esophageal reflux disease without esophagitis: Secondary | ICD-10-CM | POA: Diagnosis not present

## 2024-11-09 DIAGNOSIS — M545 Low back pain, unspecified: Secondary | ICD-10-CM | POA: Diagnosis present

## 2024-11-09 DIAGNOSIS — M47816 Spondylosis without myelopathy or radiculopathy, lumbar region: Secondary | ICD-10-CM

## 2024-11-09 DIAGNOSIS — M2460 Ankylosis, unspecified joint: Secondary | ICD-10-CM | POA: Diagnosis not present

## 2024-11-09 DIAGNOSIS — M25552 Pain in left hip: Secondary | ICD-10-CM | POA: Diagnosis present

## 2024-11-09 DIAGNOSIS — R1024 Suprapubic pain: Secondary | ICD-10-CM | POA: Diagnosis present

## 2024-11-09 LAB — URINALYSIS, ROUTINE W REFLEX MICROSCOPIC
Bilirubin Urine: NEGATIVE
Glucose, UA: NEGATIVE mg/dL
Ketones, ur: NEGATIVE mg/dL
Nitrite: NEGATIVE
Protein, ur: NEGATIVE mg/dL
Specific Gravity, Urine: 1.008 (ref 1.005–1.030)
pH: 7 (ref 5.0–8.0)

## 2024-11-09 MED ORDER — CEPHALEXIN 500 MG PO CAPS
500.0000 mg | ORAL_CAPSULE | Freq: Once | ORAL | Status: AC
  Administered 2024-11-09: 500 mg via ORAL
  Filled 2024-11-09: qty 1

## 2024-11-09 MED ORDER — CEPHALEXIN 500 MG PO CAPS: 500.0000 mg | ORAL_CAPSULE | Freq: Four times a day (QID) | ORAL | 0 refills | Status: AC

## 2024-11-09 MED ORDER — METHOCARBAMOL 500 MG PO TABS: 500.0000 mg | ORAL_TABLET | Freq: Three times a day (TID) | ORAL | 0 refills | Status: AC | PRN

## 2024-11-09 NOTE — ED Triage Notes (Signed)
 Pt reports she fell Dec 23rd. Pt reports she is having pain in her lower back and vaginal pain. PT reports she has not lost control of her bowel or bladder.

## 2024-11-09 NOTE — Discharge Instructions (Addendum)
 Your exam, labs, and CT scans are normal and reassuring.  Your symptoms do not represent a concern for spinal cord injury or severe nerve compression.  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  You are also being treated for CT evidence of UTI as well as your reports of some suprapubic pain.  Take antibiotic as directed.  Take the pain medicine as needed.  Follow-up with your primary provider for ongoing evaluation.  Urine culture is pending at this time.  You be notified if the culture results warrant a change in your treatment.

## 2024-11-09 NOTE — ED Provider Notes (Signed)
 "   [facility] Emergency Department Provider Note     Event Date/Time   First MD Initiated Contact with Patient 11/09/24 1538     (approximate)   History   Back Pain   HPI  Leah Melendez is a 79 y.o. female with a history of HTN, HLD, GERD, lymphedema, and insomnia, who presents to the ED for further evaluation of injury sustained following a mechanical fall on December 23.  Patient reports tripping and falling as she walked back into her home from the deck, tripped over the threshold.  She reports of bouncing off the multiple pieces of furniture before she, landed on her right buttocks and backside.  She denies any head injury or LOC.  Patient did not initially presented for evaluation of her injuries until last week, she presented to a local Ortho urgent care, and was placed in a knee immobilizer on the left as well as a cam boot on the left.  The patient returned to the clinic today for further evaluation, and was discharged from the use of the boot and the knee immobilizer.  She had an informed the provider that she had not been experiencing some pelvic fullness, as well as some left anterior lateral thigh paresthesias.  Patient denies any interim trauma since being placed in the assistive devices.  She denies any frank fevers, chills, or sweats.  The provider recommended the patient report to the ED for their evaluation, voicing some concern for underlying spinal cord versus radicular symptoms.  Patient gives remote history of some bilateral sciatica in the past.  She again is denying any bladder or bowel incontinence, foot drop, or saddle anesthesias.    Physical Exam   Triage Vital Signs: ED Triage Vitals  Encounter Vitals Group     BP 11/09/24 1408 (!) 143/85     Girls Systolic BP Percentile --      Girls Diastolic BP Percentile --      Boys Systolic BP Percentile --      Boys Diastolic BP Percentile --      Pulse Rate 11/09/24 1408 70     Resp  11/09/24 1408 17     Temp 11/09/24 1408 98.1 F (36.7 C)     Temp Source 11/09/24 1408 Oral     SpO2 11/09/24 1408 97 %     Weight --      Height --      Head Circumference --      Peak Flow --      Pain Score 11/09/24 1427 4     Pain Loc --      Pain Education --      Exclude from Growth Chart --     Most recent vital signs: Vitals:   11/09/24 1408  BP: (!) 143/85  Pulse: 70  Resp: 17  Temp: 98.1 F (36.7 C)  SpO2: 97%    General Awake, no distress.  NAD HEENT NCAT. PERRL. EOMI. No rhinorrhea. Mucous membranes are moist.  CV:  Good peripheral perfusion.  RRR.  No CCE distally RESP:  Normal effort.  ABD:  No distention.  MSK:  Normal spinal alignment without midline tenderness, spasm, deformity, or step-off.  AROM of all extremities.  RLE without any deformities or joint effusions.  Normal knee flexion and extension range bilaterally.  NEURO: Cranial nerves II to XII grossly intact.  Normal LE DTRs bilaterally.  Negative's supine straight leg raise bilaterally.  Normal toe dorsiflexion and foot eversion  on exam.   ED Results / Procedures / Treatments   Labs (all labs ordered are listed, but only abnormal results are displayed) Labs Reviewed  URINE CULTURE - Abnormal; Notable for the following components:      Result Value   Culture MULTIPLE SPECIES PRESENT, SUGGEST RECOLLECTION (*)    All other components within normal limits  URINALYSIS, ROUTINE W REFLEX MICROSCOPIC - Abnormal; Notable for the following components:   Color, Urine STRAW (*)    APPearance CLEAR (*)    Hgb urine dipstick SMALL (*)    Leukocytes,Ua SMALL (*)    Bacteria, UA RARE (*)    All other components within normal limits     EKG    RADIOLOGY  I personally viewed and evaluated these images as part of my medical decision making, as well as reviewing the written report by the radiologist.  ED Provider Interpretation: No acute findings of the lumbar spine or pelvic region.  Multilevel DDD  and facet arthropathy throughout the lumbar sacral spine.  Mild bladder thickening concerning for cystitis.  CT Lumbar Spine w/o CM  IMPRESSION: 1. No evidence of acute traumatic injury. 2. Advanced facet arthropathy throughout the lumbar spine.   CT Pelvis w/o CM  IMPRESSION: 1. Possible cystitis. Correlate with urinalysis.  PROCEDURES:  Critical Care performed: No  Procedures   MEDICATIONS ORDERED IN ED: Medications  cephALEXin  (KEFLEX ) capsule 500 mg (500 mg Oral Given 11/09/24 2022)     IMPRESSION / MDM / ASSESSMENT AND PLAN / ED COURSE  I reviewed the triage vital signs and the nursing notes.                              Differential diagnosis includes, but is not limited to, lumbar radiculopathy, DDD, spinal cord compression, sacroiliitis, sciatica, cystitis, cauda equina syndrome  Patient's presentation is most consistent with acute complicated illness / injury requiring diagnostic workup.  Patient's diagnosis is consistent with lumbar radiculopathy and sciatica on the left, likely aggravated by patient's recent injury and subsequent immobilization.  Geriatric patient presents to the ED in no acute distress from her Ortho clinic, for evaluation of LLE radiculopathy as well as some pelvic floor fullness.  Patient's exam overall reassuring.  She is in no acute distress and is afebrile on presentation.  Her symptoms are consistent with lumbar radiculopathy which has been evaluated in the past.  Chart review reveals prior CT imaging confirming underlying DDD and neuroforaminal narrowing.  Patient with no red flags on exam patient not endorsing any bladder or bowel incontinence, foot drop, saddle anesthesia.  Her pelvic fullness may represent cystitis which is seen on the pelvic CT imaging today.  CT lumbar spine interpreted by me, shows no acute process and no indication of spinal cord compression or acute HNP.  Overall patient's exam is reassuring.  I believe this may be likely  presented by some aggravation of her radicular symptoms due to immobilization from her fall.  No frank fractures or occult fractures are seen.  Her family are agreeable to the plan, and appreciate their normal reassuring workup.  Patient urine sample showing some mild bacteriuria, and since patient seems to be symptomatic we will treat empirically with a course of Keflex  after initial empiric Rocephin dose.  Urine culture is pending at this time.  Patient will be discharged home with prescriptions for Keflex  and methocarbamol . Patient is to follow up with her primary provider as suggested, as  needed or otherwise directed. Patient is given ED precautions to return to the ED for any worsening or new symptoms.     FINAL CLINICAL IMPRESSION(S) / ED DIAGNOSES   Final diagnoses:  Facet arthropathy, lumbar  Ankylosis  Lumbar radiculopathy  Dysuria     Rx / DC Orders   ED Discharge Orders          Ordered    cephALEXin  (KEFLEX ) 500 MG capsule  4 times daily        11/09/24 1957    methocarbamol  (ROBAXIN ) 500 MG tablet  Every 8 hours PRN        11/09/24 2004             Note:  This document was prepared using Dragon voice recognition software and may include unintentional dictation errors.    Loyd Candida LULLA Aldona, PA-C 11/12/24 1727  "

## 2024-11-11 LAB — URINE CULTURE: Special Requests: NORMAL

## 2025-01-07 ENCOUNTER — Ambulatory Visit

## 2025-01-10 ENCOUNTER — Ambulatory Visit

## 2025-01-11 ENCOUNTER — Ambulatory Visit: Admitting: Dermatology
